# Patient Record
Sex: Male | Born: 1955 | Race: White | Hispanic: No | Marital: Married | Smoking: Former smoker
Health system: Southern US, Community
[De-identification: ages and names within clinical notes are randomized; demographics above are authoritative.]

## PROBLEM LIST (undated history)

## (undated) ENCOUNTER — Emergency Department (HOSPITAL_COMMUNITY): Admission: EM | Payer: Self-pay | Source: Home / Self Care

## (undated) DIAGNOSIS — Z87448 Personal history of other diseases of urinary system: Secondary | ICD-10-CM

## (undated) DIAGNOSIS — I10 Essential (primary) hypertension: Secondary | ICD-10-CM

## (undated) DIAGNOSIS — M21371 Foot drop, right foot: Secondary | ICD-10-CM

## (undated) DIAGNOSIS — E785 Hyperlipidemia, unspecified: Secondary | ICD-10-CM

## (undated) DIAGNOSIS — J45909 Unspecified asthma, uncomplicated: Secondary | ICD-10-CM

## (undated) DIAGNOSIS — K829 Disease of gallbladder, unspecified: Secondary | ICD-10-CM

## (undated) DIAGNOSIS — E663 Overweight: Secondary | ICD-10-CM

## (undated) DIAGNOSIS — M2550 Pain in unspecified joint: Secondary | ICD-10-CM

## (undated) DIAGNOSIS — H409 Unspecified glaucoma: Secondary | ICD-10-CM

## (undated) DIAGNOSIS — R0602 Shortness of breath: Secondary | ICD-10-CM

## (undated) DIAGNOSIS — K5900 Constipation, unspecified: Secondary | ICD-10-CM

## (undated) DIAGNOSIS — I509 Heart failure, unspecified: Secondary | ICD-10-CM

## (undated) DIAGNOSIS — Z91018 Allergy to other foods: Secondary | ICD-10-CM

## (undated) DIAGNOSIS — R339 Retention of urine, unspecified: Secondary | ICD-10-CM

## (undated) DIAGNOSIS — Z96651 Presence of right artificial knee joint: Secondary | ICD-10-CM

## (undated) DIAGNOSIS — F32A Depression, unspecified: Secondary | ICD-10-CM

## (undated) DIAGNOSIS — A159 Respiratory tuberculosis unspecified: Secondary | ICD-10-CM

## (undated) DIAGNOSIS — I2510 Atherosclerotic heart disease of native coronary artery without angina pectoris: Secondary | ICD-10-CM

## (undated) DIAGNOSIS — N200 Calculus of kidney: Secondary | ICD-10-CM

## (undated) DIAGNOSIS — M7989 Other specified soft tissue disorders: Secondary | ICD-10-CM

## (undated) DIAGNOSIS — T4145XA Adverse effect of unspecified anesthetic, initial encounter: Secondary | ICD-10-CM

## (undated) DIAGNOSIS — M549 Dorsalgia, unspecified: Secondary | ICD-10-CM

## (undated) DIAGNOSIS — I214 Non-ST elevation (NSTEMI) myocardial infarction: Secondary | ICD-10-CM

## (undated) DIAGNOSIS — M21372 Foot drop, left foot: Secondary | ICD-10-CM

## (undated) DIAGNOSIS — G4733 Obstructive sleep apnea (adult) (pediatric): Secondary | ICD-10-CM

## (undated) DIAGNOSIS — K219 Gastro-esophageal reflux disease without esophagitis: Secondary | ICD-10-CM

## (undated) DIAGNOSIS — M1990 Unspecified osteoarthritis, unspecified site: Secondary | ICD-10-CM

## (undated) DIAGNOSIS — F419 Anxiety disorder, unspecified: Secondary | ICD-10-CM

## (undated) DIAGNOSIS — I252 Old myocardial infarction: Secondary | ICD-10-CM

## (undated) DIAGNOSIS — G4730 Sleep apnea, unspecified: Secondary | ICD-10-CM

## (undated) DIAGNOSIS — R6889 Other general symptoms and signs: Secondary | ICD-10-CM

## (undated) HISTORY — PX: LAPAROSCOPIC CHOLECYSTECTOMY: SUR755

## (undated) HISTORY — DX: Overweight: E66.3

## (undated) HISTORY — DX: Retention of urine, unspecified: R33.9

## (undated) HISTORY — PX: HERNIA REPAIR: SHX51

## (undated) HISTORY — DX: Pain in unspecified joint: M25.50

## (undated) HISTORY — DX: Unspecified asthma, uncomplicated: J45.909

## (undated) HISTORY — PX: KNEE ARTHROSCOPY: SHX127

## (undated) HISTORY — DX: Personal history of other diseases of urinary system: Z87.448

## (undated) HISTORY — DX: Depression, unspecified: F32.A

## (undated) HISTORY — DX: Old myocardial infarction: I25.2

## (undated) HISTORY — DX: Foot drop, right foot: M21.371

## (undated) HISTORY — DX: Dorsalgia, unspecified: M54.9

## (undated) HISTORY — DX: Shortness of breath: R06.02

## (undated) HISTORY — DX: Disease of gallbladder, unspecified: K82.9

## (undated) HISTORY — DX: Other general symptoms and signs: R68.89

## (undated) HISTORY — DX: Obstructive sleep apnea (adult) (pediatric): G47.33

## (undated) HISTORY — PX: ABDOMINAL HERNIA REPAIR: SHX539

## (undated) HISTORY — DX: Allergy to other foods: Z91.018

## (undated) HISTORY — PX: CYSTOSCOPY W/ STONE MANIPULATION: SHX1427

## (undated) HISTORY — DX: Other specified soft tissue disorders: M79.89

## (undated) HISTORY — DX: Constipation, unspecified: K59.00

## (undated) HISTORY — DX: Presence of right artificial knee joint: Z96.651

## (undated) HISTORY — DX: Unspecified glaucoma: H40.9

## (undated) HISTORY — DX: Sleep apnea, unspecified: G47.30

## (undated) HISTORY — DX: Foot drop, right foot: M21.372

---
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
## 2007-10-13 ENCOUNTER — Emergency Department (HOSPITAL_COMMUNITY): Admission: EM | Admit: 2007-10-13 | Discharge: 2007-10-13 | Payer: Self-pay | Admitting: Emergency Medicine

## 2007-10-14 ENCOUNTER — Emergency Department (HOSPITAL_COMMUNITY): Admission: EM | Admit: 2007-10-14 | Discharge: 2007-10-14 | Payer: Self-pay | Admitting: Emergency Medicine

## 2015-10-10 DIAGNOSIS — I509 Heart failure, unspecified: Secondary | ICD-10-CM

## 2015-10-10 DIAGNOSIS — I214 Non-ST elevation (NSTEMI) myocardial infarction: Secondary | ICD-10-CM

## 2015-10-10 HISTORY — DX: Non-ST elevation (NSTEMI) myocardial infarction: I21.4

## 2015-10-10 HISTORY — DX: Heart failure, unspecified: I50.9

## 2015-10-30 ENCOUNTER — Encounter (HOSPITAL_COMMUNITY)
Admission: EM | Disposition: A | Discharge status: Home / Self Care | Payer: Self-pay | Source: Home / Self Care | Attending: Internal Medicine

## 2015-10-30 ENCOUNTER — Inpatient Hospital Stay (HOSPITAL_COMMUNITY)
Admission: EM | Admit: 2015-10-30 | Discharge: 2015-11-03 | DRG: 246 | Disposition: A | Discharge status: Home / Self Care | Payer: BLUE CROSS/BLUE SHIELD | Attending: Internal Medicine | Admitting: Internal Medicine

## 2015-10-30 ENCOUNTER — Encounter (HOSPITAL_COMMUNITY): Payer: Self-pay | Admitting: Emergency Medicine

## 2015-10-30 ENCOUNTER — Ambulatory Visit (HOSPITAL_COMMUNITY): Payer: BLUE CROSS/BLUE SHIELD

## 2015-10-30 DIAGNOSIS — K5900 Constipation, unspecified: Secondary | ICD-10-CM | POA: Diagnosis not present

## 2015-10-30 DIAGNOSIS — I10 Essential (primary) hypertension: Secondary | ICD-10-CM | POA: Diagnosis present

## 2015-10-30 DIAGNOSIS — I5041 Acute combined systolic (congestive) and diastolic (congestive) heart failure: Secondary | ICD-10-CM | POA: Diagnosis present

## 2015-10-30 DIAGNOSIS — I5042 Chronic combined systolic (congestive) and diastolic (congestive) heart failure: Secondary | ICD-10-CM | POA: Diagnosis present

## 2015-10-30 DIAGNOSIS — Z87891 Personal history of nicotine dependence: Secondary | ICD-10-CM | POA: Diagnosis not present

## 2015-10-30 DIAGNOSIS — I209 Angina pectoris, unspecified: Secondary | ICD-10-CM | POA: Diagnosis not present

## 2015-10-30 DIAGNOSIS — I249 Acute ischemic heart disease, unspecified: Secondary | ICD-10-CM

## 2015-10-30 DIAGNOSIS — I110 Hypertensive heart disease with heart failure: Secondary | ICD-10-CM | POA: Diagnosis present

## 2015-10-30 DIAGNOSIS — I252 Old myocardial infarction: Secondary | ICD-10-CM

## 2015-10-30 DIAGNOSIS — I2510 Atherosclerotic heart disease of native coronary artery without angina pectoris: Secondary | ICD-10-CM | POA: Diagnosis present

## 2015-10-30 DIAGNOSIS — E785 Hyperlipidemia, unspecified: Secondary | ICD-10-CM | POA: Diagnosis present

## 2015-10-30 DIAGNOSIS — I214 Non-ST elevation (NSTEMI) myocardial infarction: Secondary | ICD-10-CM | POA: Diagnosis present

## 2015-10-30 DIAGNOSIS — E782 Mixed hyperlipidemia: Secondary | ICD-10-CM | POA: Diagnosis present

## 2015-10-30 DIAGNOSIS — I2109 ST elevation (STEMI) myocardial infarction involving other coronary artery of anterior wall: Secondary | ICD-10-CM | POA: Diagnosis present

## 2015-10-30 DIAGNOSIS — R079 Chest pain, unspecified: Secondary | ICD-10-CM | POA: Diagnosis present

## 2015-10-30 DIAGNOSIS — I5021 Acute systolic (congestive) heart failure: Secondary | ICD-10-CM | POA: Diagnosis not present

## 2015-10-30 HISTORY — PX: CARDIAC CATHETERIZATION: SHX172

## 2015-10-30 HISTORY — DX: Hyperlipidemia, unspecified: E78.5

## 2015-10-30 HISTORY — DX: Anxiety disorder, unspecified: F41.9

## 2015-10-30 HISTORY — DX: Atherosclerotic heart disease of native coronary artery without angina pectoris: I25.10

## 2015-10-30 HISTORY — DX: Essential (primary) hypertension: I10

## 2015-10-30 LAB — CBC WITH DIFFERENTIAL/PLATELET
BASOS PCT: 0 %
Basophils Absolute: 0 10*3/uL (ref 0.0–0.1)
EOS ABS: 0.4 10*3/uL (ref 0.0–0.7)
Eosinophils Relative: 3 %
HEMATOCRIT: 44.2 % (ref 39.0–52.0)
HEMOGLOBIN: 14.6 g/dL (ref 13.0–17.0)
LYMPHS ABS: 2.4 10*3/uL (ref 0.7–4.0)
Lymphocytes Relative: 22 %
MCH: 28.3 pg (ref 26.0–34.0)
MCHC: 33 g/dL (ref 30.0–36.0)
MCV: 85.7 fL (ref 78.0–100.0)
MONOS PCT: 6 %
Monocytes Absolute: 0.6 10*3/uL (ref 0.1–1.0)
NEUTROS PCT: 69 %
Neutro Abs: 7.5 10*3/uL (ref 1.7–7.7)
Platelets: 272 10*3/uL (ref 150–400)
RBC: 5.16 MIL/uL (ref 4.22–5.81)
RDW: 13 % (ref 11.5–15.5)
WBC: 11 10*3/uL — ABNORMAL HIGH (ref 4.0–10.5)

## 2015-10-30 LAB — COMPREHENSIVE METABOLIC PANEL
ALK PHOS: 66 U/L (ref 38–126)
ALT: 27 U/L (ref 17–63)
ANION GAP: 10 (ref 5–15)
AST: 28 U/L (ref 15–41)
Albumin: 3.9 g/dL (ref 3.5–5.0)
BILIRUBIN TOTAL: 0.7 mg/dL (ref 0.3–1.2)
BUN: 14 mg/dL (ref 6–20)
CALCIUM: 9 mg/dL (ref 8.9–10.3)
CO2: 25 mmol/L (ref 22–32)
Chloride: 107 mmol/L (ref 101–111)
Creatinine, Ser: 1.07 mg/dL (ref 0.61–1.24)
GFR calc non Af Amer: >60 mL/min (ref >60)
Glucose, Bld: 113 mg/dL — ABNORMAL HIGH (ref 65–99)
POTASSIUM: 3.9 mmol/L (ref 3.5–5.1)
SODIUM: 142 mmol/L (ref 135–145)
Total Protein: 7.2 g/dL (ref 6.5–8.1)

## 2015-10-30 LAB — CBC
HCT: 46 % (ref 39.0–52.0)
Hemoglobin: 15 g/dL (ref 13.0–17.0)
MCH: 28.1 pg (ref 26.0–34.0)
MCHC: 32.6 g/dL (ref 30.0–36.0)
MCV: 86.3 fL (ref 78.0–100.0)
PLATELETS: 264 10*3/uL (ref 150–400)
RBC: 5.33 MIL/uL (ref 4.22–5.81)
RDW: 13.1 % (ref 11.5–15.5)
WBC: 12.6 10*3/uL — AB (ref 4.0–10.5)

## 2015-10-30 LAB — CREATININE, SERUM
CREATININE: 1.08 mg/dL (ref 0.61–1.24)
GFR calc Af Amer: >60 mL/min (ref >60)

## 2015-10-30 LAB — I-STAT TROPONIN, ED: TROPONIN I, POC: 0.11 ng/mL — AB (ref 0.00–0.08)

## 2015-10-30 LAB — TROPONIN I: TROPONIN I: 0.8 ng/mL — AB (ref <0.031)

## 2015-10-30 LAB — PROTIME-INR
INR: 1.08 (ref 0.00–1.49)
PROTHROMBIN TIME: 14.2 s (ref 11.6–15.2)

## 2015-10-30 SURGERY — LEFT HEART CATH AND CORONARY ANGIOGRAPHY
Anesthesia: LOCAL

## 2015-10-30 MED ORDER — FUROSEMIDE 10 MG/ML IJ SOLN
INTRAMUSCULAR | Status: AC
  Filled 2015-10-30: qty 4

## 2015-10-30 MED ORDER — FUROSEMIDE 10 MG/ML IJ SOLN
INTRAMUSCULAR | Status: DC | PRN
  Administered 2015-10-30: 20 mg via INTRAVENOUS

## 2015-10-30 MED ORDER — NITROGLYCERIN IN D5W 200-5 MCG/ML-% IV SOLN: 10.0000 ug/min | INTRAVENOUS | Status: DC

## 2015-10-30 MED ORDER — IOPAMIDOL (ISOVUE-370) INJECTION 76%
INTRAVENOUS | Status: AC
  Filled 2015-10-30: qty 100

## 2015-10-30 MED ORDER — SODIUM CHLORIDE 0.9 % IV SOLN: 250.0000 mL | INTRAVENOUS | Status: DC | PRN

## 2015-10-30 MED ORDER — NITROGLYCERIN IN D5W 200-5 MCG/ML-% IV SOLN
INTRAVENOUS | Status: DC | PRN
  Administered 2015-10-30: 10 ug/min via INTRAVENOUS

## 2015-10-30 MED ORDER — NITROGLYCERIN 1 MG/10 ML FOR IR/CATH LAB
INTRA_ARTERIAL | Status: DC | PRN
  Administered 2015-10-30: 200 ug via INTRACORONARY

## 2015-10-30 MED ORDER — SODIUM CHLORIDE 0.9 % IV SOLN
INTRAVENOUS | Status: DC | PRN
  Administered 2015-10-30: 111 mL/h via INTRAVENOUS

## 2015-10-30 MED ORDER — LIDOCAINE HCL (PF) 1 % IJ SOLN
INTRAMUSCULAR | Status: AC
  Filled 2015-10-30: qty 30

## 2015-10-30 MED ORDER — BIVALIRUDIN 250 MG IV SOLR
INTRAVENOUS | Status: AC
  Filled 2015-10-30: qty 250

## 2015-10-30 MED ORDER — TICAGRELOR 90 MG PO TABS
90.0000 mg | ORAL_TABLET | Freq: Two times a day (BID) | ORAL | Status: DC
  Administered 2015-10-31 – 2015-11-03 (×7): 90 mg via ORAL
  Filled 2015-10-30 (×7): qty 1

## 2015-10-30 MED ORDER — NITROGLYCERIN 2 % TD OINT
1.0000 [in_us] | TOPICAL_OINTMENT | Freq: Once | TRANSDERMAL | Status: AC
  Administered 2015-10-30: 1 [in_us] via TOPICAL
  Filled 2015-10-30: qty 1

## 2015-10-30 MED ORDER — TICAGRELOR 90 MG PO TABS
ORAL_TABLET | ORAL | Status: DC | PRN
  Administered 2015-10-30: 180 mg via ORAL

## 2015-10-30 MED ORDER — ONDANSETRON HCL 4 MG/2ML IJ SOLN
INTRAMUSCULAR | Status: AC
  Filled 2015-10-30: qty 2

## 2015-10-30 MED ORDER — TICAGRELOR 90 MG PO TABS
ORAL_TABLET | ORAL | Status: AC
  Filled 2015-10-30: qty 2

## 2015-10-30 MED ORDER — GI COCKTAIL ~~LOC~~: 30.0000 mL | Freq: Once | ORAL | Status: DC

## 2015-10-30 MED ORDER — NITROGLYCERIN 1 MG/10 ML FOR IR/CATH LAB
INTRA_ARTERIAL | Status: AC
  Filled 2015-10-30: qty 10

## 2015-10-30 MED ORDER — ONDANSETRON HCL 4 MG/2ML IJ SOLN: 4.0000 mg | Freq: Four times a day (QID) | INTRAMUSCULAR | Status: DC | PRN

## 2015-10-30 MED ORDER — ENOXAPARIN SODIUM 40 MG/0.4ML ~~LOC~~ SOLN
40.0000 mg | SUBCUTANEOUS | Status: DC
  Administered 2015-10-31 – 2015-11-01 (×2): 40 mg via SUBCUTANEOUS
  Filled 2015-10-30 (×2): qty 0.4

## 2015-10-30 MED ORDER — FENTANYL CITRATE (PF) 100 MCG/2ML IJ SOLN
INTRAMUSCULAR | Status: AC
  Filled 2015-10-30: qty 2

## 2015-10-30 MED ORDER — IOPAMIDOL (ISOVUE-370) INJECTION 76%
INTRAVENOUS | Status: DC | PRN
  Administered 2015-10-30: 170 mL via INTRAVENOUS

## 2015-10-30 MED ORDER — ASPIRIN 81 MG PO CHEW
81.0000 mg | CHEWABLE_TABLET | Freq: Every day | ORAL | Status: DC
  Administered 2015-10-31 – 2015-11-03 (×4): 81 mg via ORAL
  Filled 2015-10-30 (×4): qty 1

## 2015-10-30 MED ORDER — CLONAZEPAM 0.5 MG PO TABS: 0.5000 mg | ORAL_TABLET | Freq: Two times a day (BID) | ORAL | Status: DC | PRN

## 2015-10-30 MED ORDER — SODIUM CHLORIDE 0.9% FLUSH
3.0000 mL | Freq: Two times a day (BID) | INTRAVENOUS | Status: DC
  Administered 2015-10-31 – 2015-11-03 (×8): 3 mL via INTRAVENOUS

## 2015-10-30 MED ORDER — ONDANSETRON HCL 4 MG/2ML IJ SOLN
INTRAMUSCULAR | Status: DC | PRN
  Administered 2015-10-30: 4 mg via INTRAVENOUS

## 2015-10-30 MED ORDER — FENTANYL CITRATE (PF) 100 MCG/2ML IJ SOLN
INTRAMUSCULAR | Status: DC | PRN
  Administered 2015-10-30: 50 ug via INTRAVENOUS

## 2015-10-30 MED ORDER — ACETAMINOPHEN 325 MG PO TABS
650.0000 mg | ORAL_TABLET | ORAL | Status: DC | PRN
  Administered 2015-11-01 – 2015-11-02 (×5): 650 mg via ORAL
  Filled 2015-10-30 (×5): qty 2

## 2015-10-30 MED ORDER — SODIUM CHLORIDE 0.9 % IV SOLN
250.0000 mg | INTRAVENOUS | Status: DC | PRN
  Administered 2015-10-30: 1.75 mg/kg/h via INTRAVENOUS

## 2015-10-30 MED ORDER — PANTOPRAZOLE SODIUM 40 MG PO TBEC
40.0000 mg | DELAYED_RELEASE_TABLET | Freq: Every day | ORAL | Status: DC
  Administered 2015-10-31 – 2015-11-03 (×4): 40 mg via ORAL
  Filled 2015-10-30 (×4): qty 1

## 2015-10-30 MED ORDER — VERAPAMIL HCL 2.5 MG/ML IV SOLN
INTRAVENOUS | Status: DC | PRN
  Administered 2015-10-30: 10 mL via INTRA_ARTERIAL

## 2015-10-30 MED ORDER — SODIUM CHLORIDE 0.9% FLUSH: 3.0000 mL | INTRAVENOUS | Status: DC | PRN

## 2015-10-30 MED ORDER — SODIUM CHLORIDE 0.9 % WEIGHT BASED INFUSION: 1.0000 mL/kg/h | INTRAVENOUS | Status: DC

## 2015-10-30 MED ORDER — HEPARIN (PORCINE) IN NACL 2-0.9 UNIT/ML-% IJ SOLN
INTRAMUSCULAR | Status: DC | PRN
  Administered 2015-10-30: 1000 mL

## 2015-10-30 MED ORDER — PROMETHAZINE HCL 25 MG/ML IJ SOLN
12.5000 mg | Freq: Four times a day (QID) | INTRAMUSCULAR | Status: DC | PRN
  Administered 2015-10-30: 12.5 mg via INTRAVENOUS
  Filled 2015-10-30: qty 1

## 2015-10-30 MED ORDER — VERAPAMIL HCL 2.5 MG/ML IV SOLN
INTRAVENOUS | Status: AC
  Filled 2015-10-30: qty 2

## 2015-10-30 MED ORDER — MIDAZOLAM HCL 2 MG/2ML IJ SOLN
INTRAMUSCULAR | Status: AC
  Filled 2015-10-30: qty 2

## 2015-10-30 MED ORDER — OXYCODONE-ACETAMINOPHEN 5-325 MG PO TABS: 1.0000 | ORAL_TABLET | ORAL | Status: DC | PRN

## 2015-10-30 MED ORDER — BIVALIRUDIN BOLUS VIA INFUSION - CUPID
INTRAVENOUS | Status: DC | PRN
  Administered 2015-10-30: 83.325 mg via INTRAVENOUS

## 2015-10-30 MED ORDER — HEPARIN (PORCINE) IN NACL 2-0.9 UNIT/ML-% IJ SOLN
INTRAMUSCULAR | Status: AC
  Filled 2015-10-30: qty 1000

## 2015-10-30 MED ORDER — MIDAZOLAM HCL 2 MG/2ML IJ SOLN
INTRAMUSCULAR | Status: DC | PRN
  Administered 2015-10-30: 1 mg via INTRAVENOUS

## 2015-10-30 MED ORDER — ATORVASTATIN CALCIUM 80 MG PO TABS
80.0000 mg | ORAL_TABLET | Freq: Every day | ORAL | Status: DC
  Administered 2015-10-31 – 2015-11-01 (×2): 80 mg via ORAL
  Filled 2015-10-30 (×2): qty 1

## 2015-10-30 MED ORDER — ALLOPURINOL 100 MG PO TABS
100.0000 mg | ORAL_TABLET | Freq: Every day | ORAL | Status: DC
  Administered 2015-10-31 – 2015-11-03 (×4): 100 mg via ORAL
  Filled 2015-10-30 (×4): qty 1

## 2015-10-30 SURGICAL SUPPLY — 16 items
BALLN EUPHORA RX 2.5X15 (BALLOONS) ×2
BALLN ~~LOC~~ EUPHORA RX 3.0X12 (BALLOONS) ×2
BALLOON EUPHORA RX 2.5X15 (BALLOONS) ×1 IMPLANT
BALLOON ~~LOC~~ EUPHORA RX 3.0X12 (BALLOONS) ×1 IMPLANT
CATH INFINITI JR4 5F (CATHETERS) ×2 IMPLANT
CATH VISTA GUIDE 6FR XB3.5 (CATHETERS) ×2 IMPLANT
DEVICE RAD COMP TR BAND LRG (VASCULAR PRODUCTS) ×2 IMPLANT
GLIDESHEATH SLEND A-KIT 6F 22G (SHEATH) ×2 IMPLANT
KIT ENCORE 26 ADVANTAGE (KITS) ×2 IMPLANT
KIT HEART LEFT (KITS) ×2 IMPLANT
PACK CARDIAC CATHETERIZATION (CUSTOM PROCEDURE TRAY) ×2 IMPLANT
STENT RESOLUTE INTEG 2.75X18 (Permanent Stent) ×2 IMPLANT
TRANSDUCER W/STOPCOCK (MISCELLANEOUS) ×2 IMPLANT
TUBING CIL FLEX 10 FLL-RA (TUBING) ×2 IMPLANT
WIRE RUNTHROUGH .014X180CM (WIRE) ×2 IMPLANT
WIRE SAFE-T 1.5MM-J .035X260CM (WIRE) ×4 IMPLANT

## 2015-10-30 NOTE — ED Notes (Signed)
Pt placed on monitor with cath pads and transported to cath lab

## 2015-10-30 NOTE — ED Provider Notes (Signed)
CSN: [id number]     Arrival date & time 10/30/15  1917 History   First MD Initiated Contact with Patient 10/30/15 1940     Chief Complaint  Patient presents with  . Chest Pain     (Consider location/radiation/quality/duration/timing/severity/associated sxs/prior Treatment) HPI Comments: Patient is a 60 year old male with no prior cardiac history. He presents with complaints of chest discomfort that started approximately 2 hours prior to presentation. He reports developing pressure in his chest with no shortness of breath, nausea, or diaphoresis. [facility].  Patient is a 60 y.o. male presenting with chest pain. The history is provided by the patient.  Chest Pain Pain location:  Substernal area Pain quality: pressure   Pain radiates to:  Does not radiate Pain radiates to the back: no   Pain severity:  Moderate Onset quality:  Sudden Duration:  2 hours Timing:  Constant Progression:  Worsening Chronicity:  New Relieved by:  Nothing Worsened by:  Nothing tried Ineffective treatments:  None tried   Past Medical History  Diagnosis Date  . Hypertension   . Anxiety    Past Surgical History  Procedure Laterality Date  . Cholecystectomy    . Hernia repair     No family history on file. Social History  Substance Use Topics  . Smoking status: Former Games developermoker  . Smokeless tobacco: None  . Alcohol Use: No    Review of Systems  Cardiovascular: Positive for chest pain.  All other systems reviewed and are negative.     Allergies  Other and Tyloxapol  Home Medications   Prior to Admission medications   Medication Sig Start Date End Date Taking? Authorizing Provider  allopurinol (ZYLOPRIM) 100 MG tablet Take 100 mg by mouth daily.   Yes Historical Provider, MD  aspirin 81 MG tablet Take 81 mg by mouth daily as needed for pain. For chest pain. Given by EMS   Yes Historical Provider, MD  clonazePAM (KLONOPIN) 0.5 MG tablet Take 0.5 mg by  mouth 2 (two) times daily as needed for anxiety.   Yes Historical Provider, MD  nitroGLYCERIN (NITROSTAT) 0.4 MG SL tablet Place 0.4 mg under the tongue every 5 (five) minutes as needed for chest pain. Given by EMS   Yes Historical Provider, MD  omeprazole (PRILOSEC) 20 MG capsule Take 20 mg by mouth 2 (two) times daily before a meal.   Yes Historical Provider, MD   BP 156/108 mmHg  Pulse 0  Resp 53  Ht 5\' 11"  (1.803 m)  Wt 245 lb (111.131 kg)  BMI 34.19 kg/m2  SpO2 0% Physical Exam  Constitutional: He is oriented to person, place, and time. He appears well-developed and well-nourished. No distress.  HENT:  Head: Normocephalic and atraumatic.  Mouth/Throat: Oropharynx is clear and moist.  Neck: Normal range of motion. Neck supple.  Cardiovascular: Normal rate and regular rhythm.  Exam reveals no friction rub.   No murmur heard. Pulmonary/Chest: Effort normal and breath sounds normal. No respiratory distress. He has no wheezes. He has no rales.  Abdominal: Soft. Bowel sounds are normal. He exhibits no distension. There is no tenderness.  Musculoskeletal: Normal range of motion. He exhibits no edema.  Neurological: He is alert and oriented to person, place, and time. Coordination normal.  Skin: Skin is warm and dry. He is not diaphoretic.  Nursing note and vitals reviewed.   ED Course  Procedures (including critical care time) Labs Review Labs Reviewed  CBC WITH DIFFERENTIAL/PLATELET - Abnormal;  Notable for the following:    WBC 11.0 (*)    All other components within normal limits  COMPREHENSIVE METABOLIC PANEL - Abnormal; Notable for the following:    Glucose, Bld 113 (*)    All other components within normal limits  I-STAT TROPOININ, ED - Abnormal; Notable for the following:    Troponin i, poc 0.11 (*)    All other components within normal limits  PROTIME-INR  BASIC METABOLIC PANEL  CBC  CBC  CREATININE, SERUM  TROPONIN I  TROPONIN I  TROPONIN I    Imaging  Review Dg Chest Port 1 View  10/30/2015  CLINICAL DATA:  Chest pain for 1 day. Acute ST elevated myocardial infarct. EXAM: PORTABLE CHEST 1 VIEW COMPARISON:  None. FINDINGS: The heart size and mediastinal contours are within normal limits. Lordotic positioning noted. Both lungs are clear. Old right fifth rib fracture deformity noted. IMPRESSION: No active disease. Electronically Signed   By: Myles Rosenthal M.D.   On: 10/30/2015 20:09   I have personally reviewed and evaluated these images and lab results as part of my medical decision-making.   EKG Interpretation   Date/Time:  Friday October 30 2015 19:24:45 EDT Ventricular Rate:  78 PR Interval:  139 QRS Duration: 89 QT Interval:  392 QTC Calculation: 446 R Axis:   46 Text Interpretation:  Sinus rhythm Normal ECG Confirmed by Keaira Whitehurst  MD,  Nevaan Bunton ([id number]) on 10/30/2015 7:31:43 PM      MDM   Final diagnoses:  None    A code STEMI was called initially by prehospital paramedics. His EKG here reveals slight J-point elevation in V2, however no other obvious abnormality. His pain did improve after morphine and nitroglycerin. He was evaluated by Dr. branch from cardiology. His troponin returned mildly positive at 0.11 and the patient will be taken to the Cath Lab for intervention due to ongoing discomfort.    Geoffery Lyons, MD 10/30/15 [phone]

## 2015-10-30 NOTE — Interval H&P Note (Signed)
Cath Lab Visit (complete for each Cath Lab visit)  Clinical Evaluation Leading to the Procedure:   ACS: Yes.    Non-ACS:    Anginal Classification: CCS IV  Anti-ischemic medical therapy: No Therapy  Non-Invasive Test Results: No non-invasive testing performed  Prior CABG: No previous CABG      History and Physical Interval Note:  10/30/2015 9:24 PM  Cheryll Cockayneicky D Breighner  has presented today for surgery, with the diagnosis of stemi  The various methods of treatment have been discussed with the patient and family. After consideration of risks, benefits and other options for treatment, the patient has consented to  Procedure(s): Left Heart Cath and Coronary Angiography (N/A) as a surgical intervention .  The patient's history has been reviewed, patient examined, no change in status, stable for surgery.  I have reviewed the patient's chart and labs.  Questions were answered to the patient's satisfaction.     Lesleigh NoeSMITH III,Bobbiejo Ishikawa W

## 2015-10-30 NOTE — ED Notes (Signed)
Pt st's pain is a #4 on pain scale 0/10.  Pt continues to belch.  Family at bedside

## 2015-10-30 NOTE — H&P (Addendum)
Clinical Summary Patrick Wilkins is a 60 y.o.male history of tobacco abuse but no known cardiac history presents with chest pain. Reports starting around 6pm onset of 10/10 pressure in midchest with diaphoresis and SOB. Symptoms improved but not resolved with SL NG x3, also received ASA in the field and  of IV morphine. Pain down from 10/10 to 5/10 but not resolved. Brought in as code STEMI by EMS  Allergies  Allergen Reactions  . Other     [place] nuts: Stop breathing   . Tyloxapol     Itching and vomiting     Medications Scheduled Medications: . gi cocktail  30 mL Oral Once     Infusions:     PRN Medications:     Past Medical History  Diagnosis Date  . Hypertension   . Anxiety     Past Surgical History  Procedure Laterality Date  . Cholecystectomy    . Hernia repair      Family History He reports his grandfather had heart disease, he is unsure of the details.    Social History Patrick Wilkins reports that he has quit smoking. He does not have any smokeless tobacco history on file. Patrick Wilkins reports that he does not drink alcohol.  Review of Systems CONSTITUTIONAL: No weight loss, fever, chills, weakness or fatigue.  HEENT: Eyes: No visual loss, blurred vision, double vision or yellow sclerae. No hearing loss, sneezing, congestion, runny nose or sore throat.  SKIN: No rash or itching.  CARDIOVASCULAR: per HPI  RESPIRATORY: No shortness of breath, cough or sputum.  GASTROINTESTINAL: No anorexia, nausea, vomiting or diarrhea. No abdominal pain or blood.  GENITOURINARY: no polyuria, no dysuria NEUROLOGICAL: No headache, dizziness, syncope, paralysis, ataxia, numbness or tingling in the extremities. No change in bowel or bladder control.  MUSCULOSKELETAL: No muscle, back pain, joint pain or stiffness.  HEMATOLOGIC: No anemia, bleeding or bruising.  LYMPHATICS: No enlarged nodes. No history of splenectomy.  PSYCHIATRIC: No history of depression or anxiety.       Physical Examination Blood pressure 155/91, pulse 77, resp. rate 16, height  (1.803 m), weight 245 lb (111.131 kg), SpO2 96 %. No intake or output data in the 24 hours ending 10/30/15 1947  HEENT: sclera clear, throat clear  Cardiovascular: RRR, no m/r/g, no jvd  Respiratory: CTAB  GI: abdomen soft, NT, ND  MSK: no LE edema  Neuro: no focal, deficits  Psych: appropriate affect   Lab Results  Basic Metabolic Panel: No results for input(s): NA, K, CL, CO2, GLUCOSE, BUN, CREATININE, CALCIUM, MG, PHOS in the last 168 hours.  Liver Function Tests: No results for input(s): AST, ALT, ALKPHOS, BILITOT, PROT, ALBUMIN in the last 168 hours.  CBC:  Recent Labs Lab 10/30/15 1923  WBC 11.0*  NEUTROABS 7.5  HGB 14.6  HCT 44.2  MCV 85.7  PLT 272    Cardiac Enzymes: No results for input(s): CKTOTAL, CKMB, CKMBINDEX, TROPONINI in the last 168 hours.  BNP: Invalid input(s): POCBNP  Bedside echo: LVEF 55%, no gross WMAs though visualization limited in several views due to body habitus and limitations on positioning.   Impression/Recommendations 1. Chest pain - acute onset chest pain improved but not resolved with SL NG. Borderline EKG findings, mainly from original field EKG when pain was most severe with borderline anterior ST elevations V1 and V2 and some inferior ST depressions representing possible reciprocal changes. On f/u ER EKGs initial EKG changes not as significant.  - borderline EKG findings  overall but combined with his clinical context there is enough concern for acute MI. Patient remains with acute chest pain with SOB and diaphoretic, will plan for cath lab and evaluation tonight. - initial troponin 0.11   Patrick Wilkins, M.D.

## 2015-10-30 NOTE — ED Notes (Signed)
[facility] with c/o mid sternal chest pain.  Pt st's he was driving when the pain started.  On EMS arrival pt was diaphoretic, pale and nauseated.  EMS gave pt ASA 324mg , NTG SL x's3 and Morphine 6mg  IV  On arrival to ED pt continues to have chest pressure.

## 2015-10-30 NOTE — ED Notes (Signed)
Dr Branch at bedside. ?

## 2015-10-31 DIAGNOSIS — I214 Non-ST elevation (NSTEMI) myocardial infarction: Secondary | ICD-10-CM

## 2015-10-31 DIAGNOSIS — I2109 ST elevation (STEMI) myocardial infarction involving other coronary artery of anterior wall: Principal | ICD-10-CM

## 2015-10-31 DIAGNOSIS — E785 Hyperlipidemia, unspecified: Secondary | ICD-10-CM

## 2015-10-31 DIAGNOSIS — I10 Essential (primary) hypertension: Secondary | ICD-10-CM

## 2015-10-31 LAB — BASIC METABOLIC PANEL
Anion gap: 10 (ref 5–15)
BUN: 11 mg/dL (ref 6–20)
CALCIUM: 8.8 mg/dL — AB (ref 8.9–10.3)
CO2: 26 mmol/L (ref 22–32)
CREATININE: 1.05 mg/dL (ref 0.61–1.24)
Chloride: 104 mmol/L (ref 101–111)
GFR calc Af Amer: >60 mL/min (ref >60)
GLUCOSE: 150 mg/dL — AB (ref 65–99)
Potassium: 4.2 mmol/L (ref 3.5–5.1)
Sodium: 140 mmol/L (ref 135–145)

## 2015-10-31 LAB — CBC
HEMATOCRIT: 43.6 % (ref 39.0–52.0)
Hemoglobin: 14 g/dL (ref 13.0–17.0)
MCH: 27.9 pg (ref 26.0–34.0)
MCHC: 32.1 g/dL (ref 30.0–36.0)
MCV: 86.9 fL (ref 78.0–100.0)
PLATELETS: 280 10*3/uL (ref 150–400)
RBC: 5.02 MIL/uL (ref 4.22–5.81)
RDW: 13.3 % (ref 11.5–15.5)
WBC: 11.9 10*3/uL — ABNORMAL HIGH (ref 4.0–10.5)

## 2015-10-31 LAB — TROPONIN I
TROPONIN I: 4.6 ng/mL — AB (ref <0.031)
TROPONIN I: 9.4 ng/mL — AB (ref <0.031)
Troponin I: 8.86 ng/mL (ref <0.031)
Troponin I: 7.99 ng/mL (ref <0.031)

## 2015-10-31 LAB — MRSA PCR SCREENING: MRSA by PCR: NEGATIVE

## 2015-10-31 MED ORDER — HEART ATTACK BOUNCING BOOK
Freq: Once | Status: AC
Start: 2015-10-31 — End: 2015-10-31
  Administered 2015-10-31: 08:00:00
  Filled 2015-10-31: qty 1

## 2015-10-31 MED ORDER — LISINOPRIL 5 MG PO TABS
5.0000 mg | ORAL_TABLET | Freq: Every day | ORAL | Status: DC
  Administered 2015-10-31 – 2015-11-01 (×2): 5 mg via ORAL
  Filled 2015-10-31 (×2): qty 1

## 2015-10-31 MED ORDER — ANGIOPLASTY BOOK
Freq: Once | Status: AC
  Administered 2015-10-31: 08:00:00
  Filled 2015-10-31: qty 1

## 2015-10-31 MED ORDER — DEXTROSE 50 % IV SOLN: 1.0000 | Freq: Once | INTRAVENOUS | Status: DC

## 2015-10-31 MED ORDER — SODIUM BICARBONATE 8.4 % IV SOLN: 50.0000 meq | Freq: Once | INTRAVENOUS | Status: DC

## 2015-10-31 MED ORDER — EXERCISE FOR HEART AND HEALTH BOOK
Freq: Once | Status: AC
  Administered 2015-10-31: 08:00:00
  Filled 2015-10-31: qty 1

## 2015-10-31 MED ORDER — CARVEDILOL 6.25 MG PO TABS
6.2500 mg | ORAL_TABLET | Freq: Two times a day (BID) | ORAL | Status: DC
  Administered 2015-10-31 – 2015-11-03 (×6): 6.25 mg via ORAL
  Filled 2015-10-31 (×6): qty 1

## 2015-10-31 NOTE — Progress Notes (Signed)
CARDIAC REHAB PHASE I   PRE:  Rate/Rhythm: 81  BP:  Sitting: 155/70     SaO2: 98  MODE:  Ambulation: 350 ft   POST:  Rate/Rhythm: 80  BP:  Sitting: 155/72     SaO2: 98  1:42pm-2:55pm Patient ambulated at a slow and steady pace. Walked independently. Minor shortness of breath, back pain 2/10. Still has chest soreness. Educated on nutrition, risk factors, exercising at home, chest pain, and restrictions. [facility]. Placed in chair with wife at side.  Barnabas ListerMolly M Demarian Epps, MS 10/31/2015 2:48 PM

## 2015-10-31 NOTE — Consult Note (Signed)
CH spoke to admin to have RN give literature/paperwork for advanced directive/HCPOA.  CH can review on Monday and arrange for notary when paperwork completed by pt. Patrick LevineMichael I Balbina Depace

## 2015-10-31 NOTE — Progress Notes (Signed)
SUBJECTIVE: The patient is doing well today.  Mild chest soreness.   Pain has resolved.  At this time, he denies shortness of breath, or any new concerns.  Marland Kitchen allopurinol  100 mg Oral Daily  . angioplasty book   Does not apply Once  . aspirin  81 mg Oral Daily  . atorvastatin  80 mg Oral q1800  . carvedilol  6.25 mg Oral BID WC  . enoxaparin (LOVENOX) injection  40 mg Subcutaneous Q24H  . excerise for heart and health book   Does not apply Once  . heart attack bouncing book   Does not apply Once  . lisinopril  5 mg Oral Daily  . pantoprazole  40 mg Oral Q0600  . sodium chloride flush  3 mL Intravenous Q12H  . ticagrelor  90 mg Oral BID      OBJECTIVE: Physical Exam: Filed Vitals:   10/31/15 0700 10/31/15 0800 10/31/15 0900 10/31/15 1000  BP: 141/75 136/70 146/72 152/86  Pulse: 78 68 84 85  Temp:  97.9 F (36.6 C)    TempSrc:  Oral    Resp: Height:      Weight:      SpO2: 98% 98% 96% 96%    Intake/Output Summary (Last 24 hours) at 10/31/15 1054 Last data filed at 10/31/15 0900  Gross per 24 hour  Intake 1033.46 ml  Output   2125 ml  Net -1091.54 ml    Telemetry reveals sinus rhythm  GEN- The patient is well appearing, alert and oriented x 3 today.   Head- normocephalic, atraumatic Eyes-  Sclera clear, conjunctiva pink Ears- hearing intact Oropharynx- clear Neck- supple,   Lungs- Clear to ausculation bilaterally, normal work of breathing Heart- Regular rate and rhythm, no murmurs, rubs or gallops, PMI not laterally displaced GI- soft, NT, ND, + BS Extremities- no clubbing, cyanosis, or edema Skin- no rash or lesion Psych- euthymic mood, full affect Neuro- strength and sensation are intact  LABS: Basic Metabolic Panel:  Recent Labs  96/04/54 1923 10/30/15 2232 10/31/15 0418  NA 142  --  140  K 3.9  --  4.2  CL 107  --  104  CO2 25  --  26  GLUCOSE 113*  --  150*  BUN 14  --  11  CREATININE 1.07 1.08 1.05  CALCIUM 9.0  --  8.8*    Liver Function Tests:  Recent Labs  10/30/15 1923  AST 28  ALT 27  ALKPHOS 66  BILITOT 0.7  PROT 7.2  ALBUMIN 3.9   No results for input(s): LIPASE, AMYLASE in the last 72 hours. CBC:  Recent Labs  10/30/15 1923 10/30/15 2232 10/31/15 0418  WBC 11.0* 12.6* 11.9*  NEUTROABS 7.5  --   --   HGB 14.6 15.0 14.0  HCT 44.2 46.0 43.6  MCV 85.7 86.3 86.9  PLT 272 264 280   Cardiac Enzymes:  Recent Labs  10/30/15 2232 10/31/15 0418  TROPONINI 0.80* 4.60*   RADIOLOGY: Dg Chest Port 1 View  10/30/2015  CLINICAL DATA:  Chest pain for 1 day. Acute ST elevated myocardial infarct. EXAM: PORTABLE CHEST 1 VIEW COMPARISON:  None. FINDINGS: The heart size and mediastinal contours are within normal limits. Lordotic positioning noted. Both lungs are clear. Old right fifth rib fracture deformity noted. IMPRESSION: No active disease. Electronically Signed   By: Myles Rosenthal M.D.   On: 10/30/2015 20:09    ASSESSMENT AND PLAN:  Principal Problem:  Acute non-ST-elevation myocardial infarction [facility](HCC) Active Problems:   Hyperlipidemia   Essential hypertension   Acute myocardial infarction of anterior wall (HCC)  1. Acute MI Cath with multivessel CAD.  S/p PCI of total occlusion of mid LAD - continue ASA/ brilinta/ statin - add lisinopril and coreg - obtain echo prior to discharge  2. HTN Above goal Add coreg and lisinopril today  Cardiac rehab  Transfer to stepdown Hopefully home in 1-2 days  Hillis RangeJames Latrelle Fuston, MD 10/31/2015 10:54 AM

## 2015-11-01 LAB — LIPID PANEL
CHOL/HDL RATIO: 4.8 ratio
Cholesterol: 171 mg/dL (ref 0–200)
HDL: 36 mg/dL — ABNORMAL LOW (ref >40)
LDL CALC: 100 mg/dL — AB (ref 0–99)
Triglycerides: 173 mg/dL — ABNORMAL HIGH (ref <150)
VLDL: 35 mg/dL (ref 0–40)

## 2015-11-01 LAB — TROPONIN I: TROPONIN I: 6.96 ng/mL — AB (ref <0.031)

## 2015-11-01 MED ORDER — ENOXAPARIN SODIUM 40 MG/0.4ML ~~LOC~~ SOLN
40.0000 mg | SUBCUTANEOUS | Status: DC
  Administered 2015-11-02 – 2015-11-03 (×2): 40 mg via SUBCUTANEOUS
  Filled 2015-11-01 (×2): qty 0.4

## 2015-11-01 MED ORDER — LISINOPRIL 10 MG PO TABS
10.0000 mg | ORAL_TABLET | Freq: Every day | ORAL | Status: DC
  Administered 2015-11-02 – 2015-11-03 (×2): 10 mg via ORAL
  Filled 2015-11-01 (×2): qty 1

## 2015-11-01 NOTE — Progress Notes (Signed)
SUBJECTIVE: The patient is doing well today.  Mild chest soreness.   At this time, he denies shortness of breath, or any new concerns.  Marland Kitchen. allopurinol  100 mg Oral Daily  . aspirin  81 mg Oral Daily  . atorvastatin  80 mg Oral q1800  . carvedilol  6.25 mg Oral BID WC  . [START ON 11/02/2015] enoxaparin (LOVENOX) injection  40 mg Subcutaneous Q24H  . [START ON 11/02/2015] lisinopril  10 mg Oral Daily  . pantoprazole  40 mg Oral Q0600  . sodium chloride flush  3 mL Intravenous Q12H  . ticagrelor  90 mg Oral BID      OBJECTIVE: Physical Exam: Filed Vitals:   11/01/15 0500 11/01/15 0600 11/01/15 0700 11/01/15 0800  BP: 142/77 151/67 123/67 138/71  Pulse: 84 82 79 79  Temp:    98.5 F (36.9 C)  TempSrc:    Oral  Resp: 20 18 13 17   Height:      Weight:      SpO2: 96% 97% 94% 96%    Intake/Output Summary (Last 24 hours) at 11/01/15 1037 Last data filed at 11/01/15 [id number]  Gross per 24 hour  Intake    860 ml  Output    825 ml  Net     35 ml    Telemetry reveals sinus rhythm  GEN- The patient is well appearing, alert and oriented x 3 today.   Head- normocephalic, atraumatic Eyes-  Sclera clear, conjunctiva pink Ears- hearing intact Oropharynx- clear Neck- supple,   Lungs- Clear to ausculation bilaterally, normal work of breathing Heart- Regular rate and rhythm, no murmurs, rubs or gallops, PMI not laterally displaced GI- soft, NT, ND, + BS Extremities- no clubbing, cyanosis, or edema Skin- no rash or lesion Psych- euthymic mood, full affect Neuro- strength and sensation are intact  LABS: Basic Metabolic Panel:  Recent Labs  96/10/5402/21/17 1923 10/30/15 2232 10/31/15 0418  NA 142  --  140  K 3.9  --  4.2  CL 107  --  104  CO2 25  --  26  GLUCOSE 113*  --  150*  BUN 14  --  11  CREATININE 1.07 1.08 1.05  CALCIUM 9.0  --  8.8*   Liver Function Tests:  Recent Labs  10/30/15 1923  AST 28  ALT 27  ALKPHOS 66  BILITOT 0.7  PROT 7.2  ALBUMIN 3.9   No results  for input(s): LIPASE, AMYLASE in the last 72 hours. CBC:  Recent Labs  10/30/15 1923 10/30/15 2232 10/31/15 0418  WBC 11.0* 12.6* 11.9*  NEUTROABS 7.5  --   --   HGB 14.6 15.0 14.0  HCT 44.2 46.0 43.6  MCV 85.7 86.3 86.9  PLT 272 264 280   Cardiac Enzymes:  Recent Labs  10/31/15 1629 10/31/15 2207 11/01/15 0503  TROPONINI 8.86* 7.99* 6.96*   RADIOLOGY: Dg Chest Port 1 View  10/30/2015  CLINICAL DATA:  Chest pain for 1 day. Acute ST elevated myocardial infarct. EXAM: PORTABLE CHEST 1 VIEW COMPARISON:  None. FINDINGS: The heart size and mediastinal contours are within normal limits. Lordotic positioning noted. Both lungs are clear. Old right fifth rib fracture deformity noted. IMPRESSION: No active disease. Electronically Signed   By: Myles RosenthalJohn  Stahl M.D.   On: 10/30/2015 20:09    ASSESSMENT AND PLAN:  Principal Problem:   Acute non-ST-elevation myocardial infarction [facility](HCC) Active Problems:   Hyperlipidemia   Essential hypertension   Acute myocardial infarction of anterior wall (HCC)  1. Acute MI Cath with multivessel CAD.  S/p PCI of total occlusion of mid LAD - continue ASA/ brilinta/ statin/ lisinopril and coreg  - echo ordered  2. HTN Above goal Lisinopril titrated today  3. DDD Avoid celebrex/ NSAIDs if able  Cardiac rehab  Transfer to telemetry Anticipate discharge in am.  Hillis Range, MD 11/01/2015 10:37 AM

## 2015-11-01 NOTE — Progress Notes (Signed)
Patient provided with Advanced Directive packet for review.

## 2015-11-02 ENCOUNTER — Inpatient Hospital Stay (HOSPITAL_COMMUNITY): Payer: BLUE CROSS/BLUE SHIELD

## 2015-11-02 ENCOUNTER — Encounter (HOSPITAL_COMMUNITY): Payer: Self-pay | Admitting: Interventional Cardiology

## 2015-11-02 DIAGNOSIS — K5900 Constipation, unspecified: Secondary | ICD-10-CM | POA: Diagnosis present

## 2015-11-02 DIAGNOSIS — I5042 Chronic combined systolic (congestive) and diastolic (congestive) heart failure: Secondary | ICD-10-CM | POA: Diagnosis present

## 2015-11-02 DIAGNOSIS — I2510 Atherosclerotic heart disease of native coronary artery without angina pectoris: Secondary | ICD-10-CM

## 2015-11-02 DIAGNOSIS — I5021 Acute systolic (congestive) heart failure: Secondary | ICD-10-CM

## 2015-11-02 LAB — ECHOCARDIOGRAM COMPLETE
Height: 71 in
Weight: 3918.4 oz

## 2015-11-02 LAB — BASIC METABOLIC PANEL
ANION GAP: 11 (ref 5–15)
BUN: 14 mg/dL (ref 6–20)
CHLORIDE: 104 mmol/L (ref 101–111)
CO2: 25 mmol/L (ref 22–32)
Calcium: 8.8 mg/dL — ABNORMAL LOW (ref 8.9–10.3)
Creatinine, Ser: 1.01 mg/dL (ref 0.61–1.24)
GFR calc Af Amer: >60 mL/min (ref >60)
Glucose, Bld: 107 mg/dL — ABNORMAL HIGH (ref 65–99)
POTASSIUM: 3.7 mmol/L (ref 3.5–5.1)
SODIUM: 140 mmol/L (ref 135–145)

## 2015-11-02 LAB — POCT ACTIVATED CLOTTING TIME: ACTIVATED CLOTTING TIME: 415 s

## 2015-11-02 MED ORDER — TRAMADOL HCL 50 MG PO TABS
50.0000 mg | ORAL_TABLET | Freq: Four times a day (QID) | ORAL | Status: DC | PRN
  Administered 2015-11-02 (×2): 50 mg via ORAL
  Filled 2015-11-02 (×2): qty 1

## 2015-11-02 MED ORDER — PSYLLIUM 95 % PO PACK
1.0000 | PACK | Freq: Every day | ORAL | Status: DC
  Administered 2015-11-02 – 2015-11-03 (×2): 1 via ORAL
  Filled 2015-11-02 (×2): qty 1

## 2015-11-02 MED ORDER — FUROSEMIDE 10 MG/ML IJ SOLN
40.0000 mg | Freq: Once | INTRAMUSCULAR | Status: AC
  Administered 2015-11-02: 40 mg via INTRAVENOUS
  Filled 2015-11-02: qty 4

## 2015-11-02 MED ORDER — ATORVASTATIN CALCIUM 40 MG PO TABS
40.0000 mg | ORAL_TABLET | Freq: Every day | ORAL | Status: DC
  Administered 2015-11-02: 40 mg via ORAL
  Filled 2015-11-02: qty 1

## 2015-11-02 MED FILL — Nitroglycerin IV Soln 100 MCG/ML in D5W: INTRA_ARTERIAL | Qty: 10 | Status: AC

## 2015-11-02 NOTE — Progress Notes (Signed)
DAILY PROGRESS NOTE  Subjective:  A little short of breath today. No chest pain. Some back pain(4/10), used to take celebrex, but we have advised against it. Tylenol helps some. Mildly constipated. Takes metamucil every day. Only a small bm today. Echo not yet completed.  Objective:  Temp:  [97.8 F (36.6 C)-98.4 F (36.9 C)] 98.1 F (36.7 C) (04/24 0537) Pulse Rate:  [64-72] 72 (04/24 0537) Resp:  [18-20] 20 (04/24 0537) BP: (114-144)/(61-73) 131/64 mmHg (04/24 0537) SpO2:  [96 %-97 %] 96 % (04/24 0537) Weight:  [244 lb 14.4 oz (111.086 kg)] 244 lb 14.4 oz (111.086 kg) (04/24 0537) Weight change:   Intake/Output from previous day: 04/23 0701 - 04/24 0700 In: 620 [P.O.:610; I.V.:10] Out: -   Intake/Output from this shift: Total I/O In: 480 [P.O.:480] Out: -   Medications: Current Facility-Administered Medications  Medication Dose Route Frequency Provider Last Rate Last Dose  . 0.9 %  sodium chloride infusion  250 mL Intravenous PRN Belva Crome, MD      . acetaminophen (TYLENOL) tablet 650 mg  650 mg Oral Q4H PRN Belva Crome, MD   650 mg at 11/02/15 0545  . allopurinol (ZYLOPRIM) tablet 100 mg  100 mg Oral Daily Belva Crome, MD   100 mg at 11/02/15 9528  . aspirin chewable tablet 81 mg  81 mg Oral Daily Belva Crome, MD   81 mg at 11/02/15 0820  . atorvastatin (LIPITOR) tablet 80 mg  80 mg Oral q1800 Belva Crome, MD   80 mg at 11/01/15 1602  . carvedilol (COREG) tablet 6.25 mg  6.25 mg Oral BID WC Thompson Grayer, MD   6.25 mg at 11/02/15 0820  . clonazePAM (KLONOPIN) tablet 0.5 mg  0.5 mg Oral BID PRN Belva Crome, MD      . enoxaparin (LOVENOX) injection 40 mg  40 mg Subcutaneous Q24H Thompson Grayer, MD   40 mg at 11/02/15 0820  . lisinopril (PRINIVIL,ZESTRIL) tablet 10 mg  10 mg Oral Daily Thompson Grayer, MD   10 mg at 11/02/15 4132  . ondansetron (ZOFRAN) injection 4 mg  4 mg Intravenous Q6H PRN Belva Crome, MD      . oxyCODONE-acetaminophen (PERCOCET/ROXICET)  5-325 MG per tablet 1-2 tablet  1-2 tablet Oral Q4H PRN Belva Crome, MD      . pantoprazole (PROTONIX) EC tablet 40 mg  40 mg Oral Q0600 Belva Crome, MD   40 mg at 11/02/15 0545  . promethazine (PHENERGAN) injection 12.5 mg  12.5 mg Intravenous Q6H PRN Clayborne Dana, MD   12.5 mg at 10/30/15 2251  . sodium chloride flush (NS) 0.9 % injection 3 mL  3 mL Intravenous Q12H Belva Crome, MD   3 mL at 11/02/15 0824  . sodium chloride flush (NS) 0.9 % injection 3 mL  3 mL Intravenous PRN Belva Crome, MD      . ticagrelor [facility]) tablet 90 mg  90 mg Oral BID Belva Crome, MD   90 mg at 11/02/15 4401    Physical Exam: General appearance: alert and no distress Neck: no carotid bruit and no JVD Lungs: rales RLL Heart: regular rate and rhythm Abdomen: soft, non-tender; bowel sounds normal; no masses,  no organomegaly Extremities: extremities normal, atraumatic, no cyanosis or edema Pulses: 2+ and symmetric Skin: Skin color, texture, turgor normal. No rashes or lesions Neurologic: Grossly normal  Lab Results: Results for orders placed or performed during  the hospital encounter of 10/30/15 (from the past 48 hour(s))  Troponin I (serum)     Status: Abnormal   Collection Time: 10/31/15 11:22 AM  Result Value Ref Range   Troponin I 9.40 (HH) <0.031 ng/mL    Comment:        POSSIBLE MYOCARDIAL ISCHEMIA. SERIAL TESTING RECOMMENDED. CRITICAL VALUE NOTED.  VALUE IS CONSISTENT WITH PREVIOUSLY REPORTED AND CALLED VALUE.   Troponin I (serum)     Status: Abnormal   Collection Time: 10/31/15  4:29 PM  Result Value Ref Range   Troponin I 8.86 (HH) <0.031 ng/mL    Comment:        POSSIBLE MYOCARDIAL ISCHEMIA. SERIAL TESTING RECOMMENDED. CRITICAL VALUE NOTED.  VALUE IS CONSISTENT WITH PREVIOUSLY REPORTED AND CALLED VALUE.   Troponin I (serum)     Status: Abnormal   Collection Time: 10/31/15 10:07 PM  Result Value Ref Range   Troponin I 7.99 (HH) <0.031 ng/mL    Comment:        POSSIBLE  MYOCARDIAL ISCHEMIA. SERIAL TESTING RECOMMENDED. CRITICAL VALUE NOTED.  VALUE IS CONSISTENT WITH PREVIOUSLY REPORTED AND CALLED VALUE.   Troponin I (serum)     Status: Abnormal   Collection Time: 11/01/15  5:03 AM  Result Value Ref Range   Troponin I 6.96 (HH) <0.031 ng/mL    Comment:        POSSIBLE MYOCARDIAL ISCHEMIA. SERIAL TESTING RECOMMENDED. CRITICAL VALUE NOTED.  VALUE IS CONSISTENT WITH PREVIOUSLY REPORTED AND CALLED VALUE.   Lipid panel     Status: Abnormal   Collection Time: 11/01/15  5:04 AM  Result Value Ref Range   Cholesterol 171 0 - 200 mg/dL   Triglycerides 173 (H) <150 mg/dL   HDL 36 (L) >40 mg/dL   Total CHOL/HDL Ratio 4.8 RATIO   VLDL 35 0 - 40 mg/dL   LDL Cholesterol 100 (H) 0 - 99 mg/dL    Comment:        Total Cholesterol/HDL:CHD Risk Coronary Heart Disease Risk Table                     Men   Women  1/2 Average Risk   3.4   3.3  Average Risk       5.0   4.4  2 X Average Risk   9.6   7.1  3 X Average Risk  23.4   11.0        Use the calculated Patient Ratio above and the CHD Risk Table to determine the patient's CHD Risk.        ATP III CLASSIFICATION (LDL):  <100     mg/dL   Optimal  100-129  mg/dL   Near or Above                    Optimal  130-159  mg/dL   Borderline  160-189  mg/dL   High  >190     mg/dL   Very High   Basic metabolic panel     Status: Abnormal   Collection Time: 11/02/15  4:52 AM  Result Value Ref Range   Sodium 140 135 - 145 mmol/L   Potassium 3.7 3.5 - 5.1 mmol/L   Chloride 104 101 - 111 mmol/L   CO2 25 22 - 32 mmol/L   Glucose, Bld 107 (H) 65 - 99 mg/dL   BUN 14 6 - 20 mg/dL   Creatinine, Ser 1.01 0.61 - 1.24 mg/dL   Calcium 8.8 (L)  8.9 - 10.3 mg/dL   GFR calc non Af Amer >60 >60 mL/min   GFR calc Af Amer >60 >60 mL/min    Comment: (NOTE) The eGFR has been calculated using the CKD EPI equation. This calculation has not been validated in all clinical situations. eGFR's persistently <60 mL/min signify possible  Chronic Kidney Disease.    Anion gap 11 5 - 15    Imaging: Imaging results have been reviewed  Assessment:  1. Principal Problem: 2.   Acute non-ST-elevation myocardial infarction ([place]) 3. Active Problems: 4.   Hyperlipidemia 5.   Essential hypertension 6.   Acute myocardial infarction of anterior wall (HCC) 7.   Plan:  1. Mildly dyspneic today-  EF 40-45%, mild right basilar crackles. Will give IV lasix today. Await echo results. Start daily metamucil per patient request for constipation. Will add tramadol for breakthrough pain, not controlled by tylenol. Radial cath site looks good. Possible d/c tomorrow. Check BMET and BNP in am. I'm happy to see the patient in follow-up at our [facility] office.  Time Spent Directly with Patient:  15 minutes   Length of Stay:  LOS: 3 days   Pixie Casino, MD, [facility] Attending Cardiologist [place] 11/02/2015, 10:39 AM

## 2015-11-02 NOTE — Progress Notes (Signed)
[id number] Came to see if pt had any questions re education done on Saturday. Brief review done stressing importance of brilinta, reviewed NTG use, heart healthy and low sodium diets, ex ed and CRP 2. Pt had walked earlier independently without CP.  We are referring to [place] Phase 2. Luetta NuttingCharlene Haylen Bellotti RN BSN 11/02/2015 10:46 AM

## 2015-11-02 NOTE — Care Management Note (Signed)
Case Management Note  Patient Details  Name: Cheryll CockayneRicky D Scafidi MRN: [id number] Date of Birth: 1955/09/21  Subjective/Objective:  Pt admitted for NStemi- Plan for d/c home on Brilinta. Co pay and 30 day free card provided to patient. CM will call pharmacy to make sure is available[facility] [place]. No further needs from CM at this time.                   Action/Plan: S/W MATE[facility] @ [facility] # [id number]   BRILINTA 90 MG BID ( 30 )   COVER- YES  CO-PAY- $ 40.00  PRIOR APPROVAL - NO  [facility]    Expected Discharge Date:  11/02/15               Expected Discharge Plan:  Home/Self Care  In-House Referral:  NA  Discharge planning Services  CM Consult, Medication Assistance  Post Acute Care Choice:  NA Choice offered to:  NA  DME Arranged:  N/A DME Agency:  NA  HH Arranged:  NA HH Agency:  NA  Status of Service:  Completed, signed off  Medicare Important Message Given:    Date Medicare IM Given:    Medicare IM give by:    Date Additional Medicare IM Given:    Additional Medicare Important Message give by:     If discussed at Long Length of Stay Meetings, dates discussed:    Additional Comments:  Gala LewandowskyGraves-Bigelow, Norina Cowper Kaye, RN 11/02/2015, 12:06 PM

## 2015-11-02 NOTE — Discharge Instructions (Addendum)
Heart-Healthy Eating Plan °Many factors influence your heart health, including eating and exercise habits. Heart (coronary) risk increases with abnormal blood fat (lipid) levels. Heart-healthy meal planning includes limiting unhealthy fats, increasing healthy fats, and making other small dietary changes. This includes maintaining a healthy body weight to help keep lipid levels within a normal range. °WHAT IS MY PLAN?  °Your health care provider recommends that you: °· Get no more than _________% of the total calories in your daily diet from fat. °· Limit your intake of saturated fat to less than _________% of your total calories each day. °· Limit the amount of cholesterol in your diet to less than _________ mg per day. °WHAT TYPES OF FAT SHOULD I CHOOSE? °· Choose healthy fats more often. Choose monounsaturated and polyunsaturated fats, such as olive oil and canola oil, flaxseeds, walnuts, almonds, and seeds. °· Eat more omega-3 fats. Good choices include salmon, mackerel, sardines, tuna, flaxseed oil, and ground flaxseeds. Aim to eat fish at least two times each week. °· Limit saturated fats. Saturated fats are primarily found in animal products, such as meats, butter, and cream. Plant sources of saturated fats include palm oil, palm kernel oil, and coconut oil. °· Avoid foods with partially hydrogenated oils in them. These contain trans fats. Examples of foods that contain trans fats are stick margarine, some tub margarines, cookies, crackers, and other baked goods. °WHAT GENERAL GUIDELINES DO I NEED TO FOLLOW? °· Check food labels carefully to identify foods with trans fats or high amounts of saturated fat. °· Fill one half of your plate with vegetables and green salads. Eat 4-5 servings of vegetables per day. A serving of vegetables equals 1 cup of raw leafy vegetables, ½ cup of raw or cooked cut-up vegetables, or ½ cup of vegetable juice. °· Fill one fourth of your plate with whole grains. Look for the word  "whole" as the first word in the ingredient list. °· Fill one fourth of your plate with lean protein foods. °· Eat 4-5 servings of fruit per day. A serving of fruit equals one medium whole fruit, ¼ cup of dried fruit, ½ cup of fresh, frozen, or canned fruit, or ½ cup of 100% fruit juice. °· Eat more foods that contain soluble fiber. Examples of foods that contain this type of fiber are apples, broccoli, carrots, beans, peas, and barley. Aim to get 20-30 g of fiber per day. °· Eat more home-cooked food and less restaurant, buffet, and fast food. °· Limit or avoid alcohol. °· Limit foods that are high in starch and sugar. °· Avoid fried foods. °· Cook foods by using methods other than frying. Baking, boiling, grilling, and broiling are all great options. Other fat-reducing suggestions include: °¨ Removing the skin from poultry. °¨ Removing all visible fats from meats. °¨ Skimming the fat off of stews, soups, and gravies before serving them. °¨ Steaming vegetables in water or broth. °· Lose weight if you are overweight. Losing just 5-10% of your initial body weight can help your overall health and prevent diseases such as diabetes and heart disease. °· Increase your consumption of nuts, legumes, and seeds to 4-5 servings per week. One serving of dried beans or legumes equals ½ cup after being cooked, one serving of nuts equals 1½ ounces, and one serving of seeds equals ½ ounce or 1 tablespoon. °· You may need to monitor your salt (sodium) intake, especially if you have high blood pressure. Talk with your health care provider or dietitian to get   more information about reducing sodium. °WHAT FOODS CAN I EAT? °Grains °Breads, including French, white, pita, wheat, raisin, rye, oatmeal, and Italian. Tortillas that are neither fried nor made with lard or trans fat. Low-fat rolls, including hotdog and hamburger buns and English muffins. Biscuits. Muffins. Waffles. Pancakes. Light popcorn. Whole-grain cereals. Flatbread. Melba  toast. Pretzels. Breadsticks. Rusks. Low-fat snacks and crackers, including oyster, saltine, matzo, graham, animal, and rye. Rice and pasta, including brown rice and those that are made with whole wheat. °Vegetables °All vegetables. °Fruits °All fruits, but limit coconut. °Meats and Other Protein Sources °Lean, well-trimmed beef, veal, pork, and lamb. Chicken and turkey without skin. All fish and shellfish. Wild duck, rabbit, pheasant, and venison. Egg whites or low-cholesterol egg substitutes. Dried beans, peas, lentils, and tofu. Seeds and most nuts. °Dairy °Low-fat or nonfat cheeses, including ricotta, string, and mozzarella. Skim or 1% milk that is liquid, powdered, or evaporated. Buttermilk that is made with low-fat milk. Nonfat or low-fat yogurt. °Beverages °Mineral water. Diet carbonated beverages. °Sweets and Desserts °Sherbets and fruit ices. Honey, jam, marmalade, jelly, and syrups. Meringues and gelatins. Pure sugar candy, such as hard candy, jelly beans, gumdrops, mints, marshmallows, and small amounts of dark chocolate. Angel food cake. °Eat all sweets and desserts in moderation. °Fats and Oils °Nonhydrogenated (trans-free) margarines. Vegetable oils, including soybean, sesame, sunflower, olive, peanut, safflower, corn, canola, and cottonseed. Salad dressings or mayonnaise that are made with a vegetable oil. Limit added fats and oils that you use for cooking, baking, salads, and as spreads. °Other °Cocoa powder. Coffee and tea. All seasonings and condiments. °The items listed above may not be a complete list of recommended foods or beverages. Contact your dietitian for more options. °WHAT FOODS ARE NOT RECOMMENDED? °Grains °Breads that are made with saturated or trans fats, oils, or whole milk. Croissants. Butter rolls. Cheese breads. Sweet rolls. Donuts. Buttered popcorn. Chow mein noodles. High-fat crackers, such as cheese or butter crackers. °Meats and Other Protein Sources °Fatty meats, such as  hotdogs, short ribs, sausage, spareribs, bacon, ribeye roast or steak, and mutton. High-fat deli meats, such as salami and bologna. Caviar. Domestic duck and goose. Organ meats, such as kidney, liver, sweetbreads, brains, gizzard, chitterlings, and heart. °Dairy °Cream, sour cream, cream cheese, and creamed cottage cheese. Whole milk cheeses, including blue (bleu), Monterey Jack, Brie, Colby, American, Havarti, Swiss, cheddar, Camembert, and [place].  Whole or 2% milk that is liquid, evaporated, or condensed. Whole buttermilk. Cream sauce or high-fat cheese sauce. Yogurt that is made from whole milk. °Beverages °Regular sodas and drinks with added sugar. °Sweets and Desserts °Frosting. Pudding. Cookies. Cakes other than angel food cake. Candy that has milk chocolate or white chocolate, hydrogenated fat, butter, coconut, or unknown ingredients. Buttered syrups. Full-fat ice cream or ice cream drinks. °Fats and Oils °Gravy that has suet, meat fat, or shortening. Cocoa butter, hydrogenated oils, palm oil, coconut oil, palm kernel oil. These can often be found in baked products, candy, fried foods, nondairy creamers, and whipped toppings. Solid fats and shortenings, including bacon fat, salt pork, lard, and butter. Nondairy cream substitutes, such as coffee creamers and sour cream substitutes. Salad dressings that are made of unknown oils, cheese, or sour cream. °The items listed above may not be a complete list of foods and beverages to avoid. Contact your dietitian for more information. °  °This information is not intended to replace advice given to you by your health care provider. Make sure you discuss any questions you have with your health   care provider.   Document Released: 04/05/2008 Document Revised: 07/18/2014 Document Reviewed: 12/19/2013 Elsevier Interactive Patient Education 2016 Else[employer] Inc[employer] Heart Attack A heart attack (myocardial infarction, MI) causes damage to your heart that cannot be fixed. A  heart attack can happen when a heart (coronary) artery becomes blocked or narrowed. This cuts off the blood supply and oxygen to your heart. When one or more of your coronary arteries becomes blocked, that area of your heart begins to die. This causes the pain you feel during a heart attack. Heart attack pain can also occur in one part of the body but be felt in another part of the body (referred pain). You may feel referred heart attack pain in your left arm, neck, or jaw. Pain may even be felt in the right arm. CAUSES  Many conditions can cause a heart attack. These include:   Atherosclerosis. This is when a fatty substance (plaque) gradually builds up in the arteries. This buildup can block or reduce the blood supply to one or more of the heart arteries.  A blood clot. A blood clot can develop suddenly when plaque breaks up (ruptures) within a heart artery. A blood clot can block the heart artery, which prevents blood flow to the heart.   Severe tightening (spasm) of the heart artery. This cuts off blood flow through the artery.  RISK FACTORS People at risk for heart attack usually have one or more of the following risk factors:   High blood pressure (hypertension).  High cholesterol.  Smoking.  Being male.  Being overweight or obese.  Older aged.   A family history of heart disease.  Lack of exercise.  Diabetes.  Stress.  Drinking too much alcohol.  Using illegal street drugs, such as cocaine and methamphetamines. SYMPTOMS  Heart attack symptoms can vary from person to person. Symptoms depend on factors like gender and age.   In both men and women, heart attack symptoms can include the following:   Chest pain. This may feel like crushing, squeezing, or a feeling of pressure.  Shortness of breath.  Heartburn or indigestion with or without vomiting, shortness of breath, or sweating.  Sudden cold sweats.  Sudden light-headedness.  Upper back pain.   Women  can have unique heart attack symptoms, such as:   Unexplained feelings of nervousness or anxiety.  Discomfort between the shoulder blades or upper back.  Tingling in the hands and arms.   Older people (of both genders) can have subtle heart attack symptoms, such as:   Sweating.  Shortness of breath.  General tiredness or not feeling well.  DIAGNOSIS  Diagnosing a heart attack involves several tests. They include:   An assessment of your vital signs. This includes checking your:  Heart rhythm.  Blood pressure.  Breathing rate.  Oxygen level.   An ECG (electrocardiogram) to measure the electrical activity of your heart.  Blood tests called cardiac markers. In these tests, blood is drawn at scheduled times to check for the specific proteins or enzymes released by damaged heart muscle.  A chest X-ray.  An echocardiogram to evaluate heart motion and blood flow.  Coronary angiography to look at the heart arteries.  TREATMENT  Treatment for a heart attack may include:   Medicine that breaks up or dissolves blood clots in the heart artery.  Angioplasty.  Cardiac stent placement.  Intra-aortic balloon pump therapy (IABP).  Open heart surgery (coronary artery bypass graft, CABG). HOME CARE INSTRUCTIONS  Take medicines only as directed by  your health care provider. You may need to take medicine after a heart attack to:   Keep your blood from clotting too easily.  Control your blood pressure.  Lower your cholesterol.  Control abnormal heart rhythms.   Do not take the following medicines unless your health care provider approves:  Nonsteroidal anti-inflammatory drugs (NSAIDs), such as ibuprofen, naproxen, or celecoxib.  Vitamin supplements that contain vitamin A, vitamin E, or both.  Hormone replacement therapy that contains estrogen with or without progestin.  Make lifestyle changes as directed by your health care provider. These may include:    Quitting smoking, if you smoke.  Getting regular exercise. Ask your health care provider to suggest some activities that are safe for you.  Eating a heart-healthy diet. A registered dietitian can help you learn healthy eating options.  Maintaining a healthy weight.   Managing diabetes, if necessary.  Reducing stress.  Limiting how much alcohol you drink. SEEK IMMEDIATE MEDICAL CARE IF:   You have sudden, unexplained chest discomfort.  You have sudden, unexplained discomfort in your arms, back, neck, or jaw.  You have shortness of breath at any time.  You suddenly start to sweat or your skin gets clammy.  You feel nauseous or vomit.  You suddenly feel light-headed or dizzy.  Your heart begins to beat fast or feels like it is skipping beats. These symptoms may represent a serious problem that is an emergency. Do not wait to see if the symptoms will go away. Get medical help right away. Call your local emergency services (911 in the [place] Do not drive yourself to the hospital.   This information is not intended to replace advice given to you by your health care provider. Make sure you discuss any questions you have with your health care provider.   Document Released: 06/27/2005 Document Revised: 07/18/2014 Document Reviewed: 08/30/2013 Elsevier Interactive Patient Education [employer]

## 2015-11-02 NOTE — Progress Notes (Signed)
Pt states he believes Lipitor is causing new aches in his "Hip" joint. PA, Wynema BirchHao agrees to reduce dose from 80mg  to 40mg . New order received.

## 2015-11-02 NOTE — Progress Notes (Signed)
  Echocardiogram 2D Echocardiogram has been performed.  Janalyn HarderWest, Gwin Eagon R 11/02/2015, 2:23 PM

## 2015-11-03 ENCOUNTER — Telehealth: Payer: Self-pay | Admitting: Internal Medicine

## 2015-11-03 ENCOUNTER — Encounter (HOSPITAL_COMMUNITY): Payer: Self-pay | Admitting: Physician Assistant

## 2015-11-03 LAB — BASIC METABOLIC PANEL
Anion gap: 12 (ref 5–15)
BUN: 17 mg/dL (ref 6–20)
CALCIUM: 9.1 mg/dL (ref 8.9–10.3)
CHLORIDE: 100 mmol/L — AB (ref 101–111)
CO2: 26 mmol/L (ref 22–32)
CREATININE: 1.17 mg/dL (ref 0.61–1.24)
GFR calc Af Amer: >60 mL/min (ref >60)
GFR calc non Af Amer: >60 mL/min (ref >60)
GLUCOSE: 104 mg/dL — AB (ref 65–99)
Potassium: 3.6 mmol/L (ref 3.5–5.1)
Sodium: 138 mmol/L (ref 135–145)

## 2015-11-03 LAB — BRAIN NATRIURETIC PEPTIDE: B Natriuretic Peptide: 41.1 pg/mL (ref 0.0–100.0)

## 2015-11-03 MED ORDER — LISINOPRIL 10 MG PO TABS: 10.0000 mg | ORAL_TABLET | Freq: Every day | ORAL | Status: DC

## 2015-11-03 MED ORDER — CARVEDILOL 6.25 MG PO TABS: 6.2500 mg | ORAL_TABLET | Freq: Two times a day (BID) | ORAL | Status: DC

## 2015-11-03 MED ORDER — TICAGRELOR 90 MG PO TABS: 90.0000 mg | ORAL_TABLET | Freq: Two times a day (BID) | ORAL | Status: DC

## 2015-11-03 MED ORDER — NITROGLYCERIN 0.4 MG SL SUBL: 0.4000 mg | SUBLINGUAL_TABLET | SUBLINGUAL | Status: DC | PRN

## 2015-11-03 MED ORDER — ATORVASTATIN CALCIUM 80 MG PO TABS: 80.0000 mg | ORAL_TABLET | Freq: Every day | ORAL | Status: DC

## 2015-11-03 NOTE — Telephone Encounter (Signed)
Pt was discharge today 04/25, first TOC call should be on 04/26 

## 2015-11-03 NOTE — Progress Notes (Signed)
Pt ambulated in hall independently multiple times last pm.  Pt denied CP/SOB.  Will continue to monitor.

## 2015-11-03 NOTE — Discharge Summary (Signed)
Discharge Summary    Patient ID: Patrick Wilkins,  MRN: [id number], DOB/AGE: 1955-12-30 60 y.o.  Admit date: 10/30/2015 Discharge date: 11/03/2015  Primary Care Provider: Teola BradleyMORGAN, Patrick K II Primary Cardiologist: Dr. Rennis GoldenHilty ([place])  Discharge Diagnoses    Principal Problem:   Acute non-ST-elevation myocardial infarction [facility](HCC) Active Problems:   Hyperlipidemia   Essential hypertension   Acute myocardial infarction of anterior wall (HCC)   Acute systolic (congestive) heart failure (HCC)   Constipation   Allergies Allergies  Allergen Reactions  . Other     Estonia[place] nuts: Stop breathing   . Tyloxapol     Itching and vomiting     Diagnostic Studies/Procedures    Procedures    Coronary Stent Intervention   Left Heart Cath and Coronary Angiography    Conclusion    1. Ost 1st Mrg to 1st Mrg lesion, 70% stenosed. 2. Mid Cx to Dist Cx lesion, 60% stenosed. 3. RPDA lesion, 70% stenosed. 4. Prox RCA to Dist RCA lesion, 25% stenosed. 5. Ost 1st Diag lesion, 75% stenosed. 6. Ost LAD to Prox LAD lesion, 60% stenosed. 7. LM lesion, 45% stenosed. 8. Mid LAD to Dist LAD lesion, 100% stenosed. Post intervention, there is a 0% residual stenosis.   Acute coronary syndrome with presentation of non-ST elevation myocardial infarction noted by ongoing chest pain and elevated troponins. There were no diagnostic ischemic EKG changes noted.  Total occlusion of the mid LAD well collateralized from the right coronary explaining the absence of EKG changes.  Successful PTCA and stenting of the mid LAD from 100% to 0% with TIMI grade 3 flow using a resolute 2.75 mm x 18 mm long stent postdilated to 3.0 mm.  There is ostial and proximal LAD 50% stenosis, 50-70% PDA stenosis, and 70% stenosis of the first obtuse marginal.  Apical akinesis with estimated EF 40-45% with elevated LVEDP consistent with acute combined systolic and diastolic heart failure.   RECOMMENDATIONS:  IV heparin 20 mg  IV  Aspirin and Brilinta 12 months  Aggressive risk factor modification including high-intensity statin therapy, screening for diabetes, and aggressive blood pressure control.  Potential fast-track eligible for discharge at 48 hours.  Anticipate significant recovery of apical segment due to the presence of well formed collaterals.   Echo 11/02/15.  LV EF: 50% - 55%  ------------------------------------------------------------------- Indications: CAD of native vessels 414.01.  ------------------------------------------------------------------- History: PMH: Acute anterior MI. NSTEMI. Congestive heart failure. Risk factors: Hypertension. Dyslipidemia.  ------------------------------------------------------------------- Study Conclusions  - Procedure narrative: Transthoracic echocardiography. Image  quality was adequate. The study was technically difficult, as a  result of poor acoustic windows, poor sound wave transmission,  and body habitus. - Left ventricle: The cavity size was normal. Systolic function was  normal. The estimated ejection fraction was in the range of 50%  to 55%. Wall motion was normal; there were no regional wall  motion abnormalities. Features are consistent with a pseudonormal  left ventricular filling pattern, with concomitant abnormal  relaxation and increased filling pressure (grade 2 diastolic  dysfunction).  Impressions:  - Low normal LV systolic function; grade 2 diastolic dysfunction;  trace MR and TR.    History of Present Illness     [name] is a 60 y.o.male history of tobacco abuse but no known cardiac history presented 10/30/15 with chest pain. Reports starting around 6pm onset of 10/10 pressure in midchest with diaphoresis and SOB. Symptoms improved but not resolved with SL NG x3, also received ASA in the field  and  of IV morphine. Pain down from 10/10 to 5/10 but not resolved. Brought in as code STEMI by  EMS. Initial troponin of 0.11.   Hospital Course     Consultants: None  Cath with multivessel CAD. S/p PTCA and DES of total occlusion of mid LAD. Continued ASA/ brilinta/ statin/ lisinopril and coreg. Continue to have mild chest discomfort. Due to elevated BP, lisinopril dose was adjusted. The patient was given IV lasix for mild right basilar crackles and EF of 40-45% on cath. Echo showed improved EF to 50-55%, grade 2 DD, trace MR and TR. Dyspnea has resolved with lasix, felt he will not need lasix at home. Changed protonix back to omeprazole at discharge.  The patient takes Celebrex for DDD, advised to avoid celebrex/ NSAIDs if able.   The patient has been seen by Dr. Rennis Golden today and deemed ready for discharge home. All follow-up appointments have been scheduled. Discharge medications are listed below.    Discharge Vitals Blood pressure 120/62, pulse 74, temperature 97.9 F (36.6 C), temperature source Oral, resp. rate 20, height  (1.803 m), weight 244 lb 8 oz (110.904 kg), SpO2 98 %.  Filed Weights   10/30/15 2230 11/02/15 0537 11/03/15 0300  Weight: 247 lb 12.8 oz (112.4 kg) 244 lb 14.4 oz (111.086 kg) 244 lb 8 oz (110.904 kg)    Labs & Radiologic Studies     CBC No results for input(s): WBC, NEUTROABS, HGB, HCT, MCV, PLT in the last 72 hours. Basic Metabolic Panel  Recent Labs  11/02/15 0452 11/03/15 0609  NA 140 138  K 3.7 3.6  CL 104 100*  CO2 25 26  GLUCOSE 107* 104*  BUN 14 17  CREATININE 1.01 1.17  CALCIUM 8.8* 9.1   Liver Function Tests No results for input(s): AST, ALT, ALKPHOS, BILITOT, PROT, ALBUMIN in the last 72 hours. No results for input(s): LIPASE, AMYLASE in the last 72 hours. Cardiac Enzymes  Recent Labs  10/31/15 1629 10/31/15 2207 11/01/15 0503  TROPONINI 8.86* 7.99* 6.96*   BNP Invalid input(s): POCBNP D-Dimer No results for input(s): DDIMER in the last 72 hours. Hemoglobin A1C No results for input(s): HGBA1C in the last 72  hours. Fasting Lipid Panel  Recent Labs  11/01/15 0504  CHOL 171  HDL 36*  LDLCALC 100*  TRIG 173*  CHOLHDL 4.8   Thyroid Function Tests No results for input(s): TSH, T4TOTAL, T3FREE, THYROIDAB in the last 72 hours.  Invalid input(s): FREET3  Dg Chest Port 1 View  10/30/2015  CLINICAL DATA:  Chest pain for 1 day. Acute ST elevated myocardial infarct. EXAM: PORTABLE CHEST 1 VIEW COMPARISON:  None. FINDINGS: The heart size and mediastinal contours are within normal limits. Lordotic positioning noted. Both lungs are clear. Old right fifth rib fracture deformity noted. IMPRESSION: No active disease. Electronically Signed   By: Myles Rosenthal M.D.   On: 10/30/2015 20:09    Disposition   Pt is being discharged home today in good condition.  Follow-up Plans & Appointments    Follow-up Information    Follow up with [facility] On 11/10/2015.   Specialty:  Cardiology   Why:  :00 am for TCM @ [place], NP   Contact information:   [address] [place] [address] [phone]     Discharge Instructions    Amb Referral to Cardiac Rehabilitation    Complete by:  As directed   Diagnosis:  NSTEMI     Diet -  low sodium heart healthy    Complete by:  As directed      Discharge instructions    Complete by:  As directed   NO HEAVY LIFTING (>10lbs) X 2 WEEKS. NO SEXUAL ACTIVITY X 2 WEEKS. NO DRIVING X 1 WEEK. NO SOAKING BATHS, HOT TUBS, POOLS, ETC., X 7 DAYS. You may return to work on 11/11/15.     Increase activity slowly    Complete by:  As directed            Discharge Medications   Discharge Medication List as of 11/03/2015 10:36 AM    START taking these medications   Details  atorvastatin (LIPITOR) 80 MG tablet Take 1 tablet (80 mg total) by mouth daily at 6 PM., Starting 11/03/2015, Until Discontinued, Normal    carvedilol (COREG) 6.25 MG tablet Take 1 tablet (6.25 mg total) by mouth 2 (two) times daily with a meal.,  Starting 11/03/2015, Until Discontinued, Normal    lisinopril (PRINIVIL,ZESTRIL) 10 MG tablet Take 1 tablet (10 mg total) by mouth daily., Starting 11/03/2015, Until Discontinued, Normal      CONTINUE these medications which have CHANGED   Details  ticagrelor (BRILINTA) 90 MG TABS tablet Take 1 tablet (90 mg total) by mouth 2 (two) times daily., Starting 11/03/2015, Until Discontinued, Print      CONTINUE these medications which have NOT CHANGED   Details  allopurinol (ZYLOPRIM) 100 MG tablet Take 100 mg by mouth daily., Until Discontinued, Historical Med    aspirin 81 MG tablet Take 81 mg by mouth daily as needed for pain. For chest pain. Given by EMS, Until Discontinued, Historical Med    clonazePAM (KLONOPIN) 0.5 MG tablet Take 0.5 mg by mouth 2 (two) times daily as needed for anxiety., Until Discontinued, Historical Med    omeprazole (PRILOSEC) 20 MG capsule Take 20 mg by mouth 2 (two) times daily before a meal., Until Discontinued, Historical Med    nitroGLYCERIN (NITROSTAT) 0.4 MG SL tablet Place 0.4 mg under the tongue every 5 (five) minutes as needed for chest pain. Given by EMS, Until Discontinued, Historical Med         Aspirin prescribed at discharge?  Yes High Intensity Statin Prescribed? (Lipitor 40-80mg  or Crestor 20-40mg ): Yes Beta Blocker Prescribed? Yes For EF 45% or less, Was ACEI/ARB Prescribed? Yes ADP Receptor Inhibitor Prescribed? (i.e. Plavix etc.-Includes Medically Managed Patients): Yes For EF <40%, Aldosterone Inhibitor Prescribed? N/A Was EF assessed during THIS hospitalization? Yes Was Cardiac Rehab II ordered? (Included Medically managed Patients): Yes   Outstanding Labs/Studies   None  Duration of Discharge Encounter   Greater than 30 minutes including physician time.  Signed, Rosabell Geyer PA-C 11/03/2015, 1:10 PM

## 2015-11-03 NOTE — Telephone Encounter (Signed)
TCM phone call. appt is on 11/10/15 at 9am w/ Ward [facility]ris [facility]   Thanks

## 2015-11-03 NOTE — Progress Notes (Signed)
Pt discharged home. Discharge instructions have been gone over with the patient. IV's removed. Pt given unit number and told to call if they have any concerns regarding their discharge instructions. Patient and wife educated on Heart Attack and Medications. RN answered all questions, and told patient to call if he thinks of any new questions. Pt and family verbalized understanding. Cecille Rubinhompson,Braylie Badami V, RN

## 2015-11-03 NOTE — Progress Notes (Signed)
DAILY PROGRESS NOTE  Subjective:  No events overnight. Diuresed about 2L. Breathing back to normal. BNP 41 this morning. Labs stable. He had a short pain in his right hip, that improved with ambulation overnight. I do not feel this is related to lipitor. Benefit of high-dose statin outweighs risk of possible myalgias post-NSTEMI. Discussed residual CAD with him - both areas are distal and best treated medically for now. LVEF has improved by echo to 50-55%. He is eager to go home.  Objective:  Temp:  [97.9 F (36.6 C)-98.5 F (36.9 C)] 97.9 F (36.6 C) (04/25 0300) Pulse Rate:  [67-74] 74 (04/25 0300) Resp:  [18-20] 20 (04/25 0300) BP: (110-140)/(54-73) 120/62 mmHg (04/25 0300) SpO2:  [97 %-98 %] 98 % (04/25 0300) Weight:  [244 lb 8 oz (110.904 kg)] 244 lb 8 oz (110.904 kg) (04/25 0300) Weight change: -6.4 oz (-0.181 kg)  Intake/Output from previous day: 04/24 0701 - 04/25 0700 In: 1560 [P.O.:1560] Out: 2675 [Urine:2675]  Intake/Output from this shift: Total I/O In: 240 [P.O.:240] Out: 225 [Urine:225]  Medications: Current Facility-Administered Medications  Medication Dose Route Frequency Provider Last Rate Last Dose  . 0.9 %  sodium chloride infusion  250 mL Intravenous PRN Belva Crome, MD      . acetaminophen (TYLENOL) tablet 650 mg  650 mg Oral Q4H PRN Belva Crome, MD   650 mg at 11/02/15 0545  . allopurinol (ZYLOPRIM) tablet 100 mg  100 mg Oral Daily Belva Crome, MD   100 mg at 11/03/15 0826  . aspirin chewable tablet 81 mg  81 mg Oral Daily Belva Crome, MD   81 mg at 11/03/15 7026  . atorvastatin (LIPITOR) tablet 40 mg  40 mg Oral q1800 Almyra Deforest, PA   40 mg at 11/02/15 1809  . carvedilol (COREG) tablet 6.25 mg  6.25 mg Oral BID WC Thompson Grayer, MD   6.25 mg at 11/03/15 0824  . clonazePAM (KLONOPIN) tablet 0.5 mg  0.5 mg Oral BID PRN Belva Crome, MD      . enoxaparin (LOVENOX) injection 40 mg  40 mg Subcutaneous Q24H Thompson Grayer, MD   40 mg at 11/03/15 3785    . lisinopril (PRINIVIL,ZESTRIL) tablet 10 mg  10 mg Oral Daily Thompson Grayer, MD   10 mg at 11/03/15 0824  . ondansetron (ZOFRAN) injection 4 mg  4 mg Intravenous Q6H PRN Belva Crome, MD      . pantoprazole (PROTONIX) EC tablet 40 mg  40 mg Oral Q0600 Belva Crome, MD   40 mg at 11/03/15 8850  . promethazine (PHENERGAN) injection 12.5 mg  12.5 mg Intravenous Q6H PRN Clayborne Dana, MD   12.5 mg at 10/30/15 2251  . psyllium (HYDROCIL/METAMUCIL) packet 1 packet  1 packet Oral Daily Pixie Casino, MD   1 packet at 11/03/15 [phone]  . sodium chloride flush (NS) 0.9 % injection 3 mL  3 mL Intravenous Q12H Belva Crome, MD   3 mL at 11/03/15 0826  . sodium chloride flush (NS) 0.9 % injection 3 mL  3 mL Intravenous PRN Belva Crome, MD      . ticagrelor [facility]) tablet 90 mg  90 mg Oral BID Belva Crome, MD   90 mg at 11/03/15 0825  . traMADol (ULTRAM) tablet 50 mg  50 mg Oral Q6H PRN Pixie Casino, MD   50 mg at 11/02/15 2106    Physical Exam: General appearance: alert and  no distress Neck: no carotid bruit and no JVD Lungs: clear to auscultation bilaterally Heart: regular rate and rhythm Abdomen: soft, non-tender; bowel sounds normal; no masses,  no organomegaly Extremities: extremities normal, atraumatic, no cyanosis or edema Pulses: 2+ and symmetric Skin: Skin color, texture, turgor normal. No rashes or lesions Neurologic: Grossly normal  Lab Results: Results for orders placed or performed during the hospital encounter of 10/30/15 (from the past 48 hour(s))  Basic metabolic panel     Status: Abnormal   Collection Time: 11/02/15  4:52 AM  Result Value Ref Range   Sodium 140 135 - 145 mmol/L   Potassium 3.7 3.5 - 5.1 mmol/L   Chloride 104 101 - 111 mmol/L   CO2 25 22 - 32 mmol/L   Glucose, Bld 107 (H) 65 - 99 mg/dL   BUN 14 6 - 20 mg/dL   Creatinine, Ser 1.01 0.61 - 1.24 mg/dL   Calcium 8.8 (L) 8.9 - 10.3 mg/dL   GFR calc non Af Amer >60 >60 mL/min   GFR calc Af Amer >60 >60  mL/min    Comment: (NOTE) The eGFR has been calculated using the CKD EPI equation. This calculation has not been validated in all clinical situations. eGFR's persistently <60 mL/min signify possible Chronic Kidney Disease.    Anion gap 11 5 - 15  Basic metabolic panel     Status: Abnormal   Collection Time: 11/03/15  6:09 AM  Result Value Ref Range   Sodium 138 135 - 145 mmol/L   Potassium 3.6 3.5 - 5.1 mmol/L   Chloride 100 (L) 101 - 111 mmol/L   CO2 26 22 - 32 mmol/L   Glucose, Bld 104 (H) 65 - 99 mg/dL   BUN 17 6 - 20 mg/dL   Creatinine, Ser 1.17 0.61 - 1.24 mg/dL   Calcium 9.1 8.9 - 10.3 mg/dL   GFR calc non Af Amer >60 >60 mL/min   GFR calc Af Amer >60 >60 mL/min    Comment: (NOTE) The eGFR has been calculated using the CKD EPI equation. This calculation has not been validated in all clinical situations. eGFR's persistently <60 mL/min signify possible Chronic Kidney Disease.    Anion gap 12 5 - 15  Brain natriuretic peptide     Status: None   Collection Time: 11/03/15  6:09 AM  Result Value Ref Range   B Natriuretic Peptide 41.1 0.0 - 100.0 pg/mL    Imaging: Imaging results have been reviewed  Assessment:  Principal Problem:   Acute non-ST-elevation myocardial infarction [facility]) Active Problems:   Hyperlipidemia   Essential hypertension   Acute myocardial infarction of anterior wall (HCC)   Acute systolic (congestive) heart failure (HCC)   Constipation   Plan:  1. Dyspnea has resolved with lasix. LVEF has improved to 50-55%, will not likely need lasix at home. Will increase lipitor back to 80 mg daily. Continue ASA and Brillinta at discharge. Also on carvedilol and lisinopril. Change protonix back to omeprazole at discharge. [place] for d/c home today. Follow-up in 7-10 days with MLP and eventually with me in the office.  Time Spent Directly with Patient:  15 minutes   Length of Stay:  LOS: 4 days   Pixie Casino, MD, [facility] Attending Cardiologist [place] 11/03/2015, 8:40 AM

## 2015-11-04 NOTE — Telephone Encounter (Signed)
[facility]  Patient understands to follow up with provider ? 5/2 @ 9am; pt asked arrive 15 mins early; [facility]  Patient understands discharge instructions? Yes; reviewed and reminded of restrictions Patient understands medications and regiment? Yes, pt has created a chart to help keep medications on schedule  Patient understands to bring all medications to this visit? Yes  Walking daily about 10-15 mins., cleared by therapy before leaving hospital, he experienced some chest "soreness" today during walk but stopped after couple hours it went away. He did NOT take a nitro and had not other c/o like dizziness, sob, sweaty, radiating pain when this chest soreness what happening.

## 2015-11-05 ENCOUNTER — Telehealth: Payer: Self-pay | Admitting: Internal Medicine

## 2015-11-05 NOTE — Telephone Encounter (Signed)
Pt is calling in wanting to speak with the nurse about his Brilinta.

## 2015-11-05 NOTE — Telephone Encounter (Signed)
Spoke with patient regarding Brilinta He states he was told he should take his med at 845 at each dose.  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

## 2015-11-09 ENCOUNTER — Encounter: Payer: Self-pay | Admitting: Physician Assistant

## 2015-11-09 ENCOUNTER — Ambulatory Visit (INDEPENDENT_AMBULATORY_CARE_PROVIDER_SITE_OTHER): Payer: BLUE CROSS/BLUE SHIELD | Admitting: Physician Assistant

## 2015-11-09 VITALS — BP 120/70 | HR 68 | Ht 71.0 in | Wt 241.0 lb

## 2015-11-09 DIAGNOSIS — I2510 Atherosclerotic heart disease of native coronary artery without angina pectoris: Secondary | ICD-10-CM | POA: Insufficient documentation

## 2015-11-09 DIAGNOSIS — I2583 Coronary atherosclerosis due to lipid rich plaque: Secondary | ICD-10-CM

## 2015-11-09 DIAGNOSIS — I2109 ST elevation (STEMI) myocardial infarction involving other coronary artery of anterior wall: Secondary | ICD-10-CM

## 2015-11-09 DIAGNOSIS — E785 Hyperlipidemia, unspecified: Secondary | ICD-10-CM

## 2015-11-09 DIAGNOSIS — I519 Heart disease, unspecified: Secondary | ICD-10-CM

## 2015-11-09 DIAGNOSIS — I10 Essential (primary) hypertension: Secondary | ICD-10-CM

## 2015-11-09 DIAGNOSIS — I5189 Other ill-defined heart diseases: Secondary | ICD-10-CM | POA: Insufficient documentation

## 2015-11-09 DIAGNOSIS — E669 Obesity, unspecified: Secondary | ICD-10-CM

## 2015-11-09 MED ORDER — TRAMADOL HCL 50 MG PO TABS: 50.0000 mg | ORAL_TABLET | Freq: Four times a day (QID) | ORAL | Status: DC | PRN

## 2015-11-09 NOTE — Patient Instructions (Signed)
Your physician wants you to follow-up in: 3 Months with Dr Georgeann OppenheimHilty You will receive a reminder letter in the mail two months in advance. If you don't receive a letter, please call our office to schedule the follow-up appointment.  Your physician recommends that you return for lab work in: 8 Weeks Fasting Lipids Liver

## 2015-11-09 NOTE — Progress Notes (Signed)
Patient ID: Patrick CockayneRicky D Bhullar, male   DOB: 25-Jan-1956, 60 y.o.   MRN: [id number]    Date:  11/09/2015   ID:  Patrick CockayneRicky D Bova, DOB 25-Jan-1956, MRN [id number]  PCP:  Ulyess BlossomMORGAN, MELVIN K II, MD  [facility]  [facility]  Chief Complaint  Patient presents with  . Follow-up    some shortness of breath, nervous feeling inside (Klonopin) helps     History of Present Illness: Patrick Wilkins is a 60 y.o. male history of tobacco abuse but no known cardiac history presented 10/30/15 with chest pain. Reports starting around 6pm onset of 10/10 pressure in midchest with diaphoresis and SOB. Symptoms improved but not resolved with SL NG x3, also received ASA in the field and 6mg  of IV morphine. Pain down from 10/10 to 5/10 but not resolved. Brought in as code STEMI by EMS. Initial troponin of 0.11.   Cath with multivessel CAD. S/p PTCA and DES of total occlusion of mid LAD. Continued ASA/ brilinta/ statin/ lisinopril and coreg. Continue to have mild chest discomfort. Due to elevated BP, lisinopril dose was adjusted. The patient was given IV lasix for mild right basilar crackles and EF of 40-45% on cath. Echo showed improved EF to 50-55%, grade 2 DD, trace MR and TR. Dyspnea has resolved with lasix, felt he will not need lasix at home. Changed protonix back to omeprazole at discharge. The patient takes Celebrex for DDD, advised to avoid celebrex/ NSAIDs if able.  Patient presents for posthospital follow-up. He reports doing well. He is walking 15 minutes twice a day without any problems. He is taking Tylenol instead of Celebrex for his back. He has requested tramadol which helped in the hospital. He is taking all medications as prescribed. Did state that he was having some quivering in his heart/nervousness 1 day or so he took his clonazepam and that resolved it.   The patient currently denies nausea, vomiting, fever, chest pain, shortness of breath, orthopnea, dizziness, PND, cough, congestion, abdominal pain,  hematochezia, melena, lower extremity edema, claudication.   Wt Readings from Last 3 Encounters:  11/09/15 241 lb (109.317 kg)  11/03/15 244 lb 8 oz (110.904 kg)     Past Medical History  Diagnosis Date  . Hypertension   . Anxiety   . CAD (coronary artery disease)     a. NSTEMI 10/30/15 s/p PTCA and DES of total occusion of  mid LAD. ostial and proximal LAD 50% stenosis, 50-70% PDA stenosis, and 70% stenosis of the first obtuse marginal.   . Hyperlipidemia     Current Outpatient Prescriptions  Medication Sig Dispense Refill  . allopurinol (ZYLOPRIM) 100 MG tablet Take 100 mg by mouth daily.    Marland Kitchen. aspirin 81 MG tablet Take 81 mg by mouth daily as needed for pain. For chest pain. Given by EMS    . atorvastatin (LIPITOR) 80 MG tablet Take 1 tablet (80 mg total) by mouth daily at 6 PM. 30 tablet 6  . carvedilol (COREG) 6.25 MG tablet Take 1 tablet (6.25 mg total) by mouth 2 (two) times daily with a meal. 60 tablet 11  . clonazePAM (KLONOPIN) 0.5 MG tablet Take 0.5 mg by mouth 2 (two) times daily as needed for anxiety.    Marland Kitchen. lisinopril (PRINIVIL,ZESTRIL) 10 MG tablet Take 1 tablet (10 mg total) by mouth daily. 30 tablet 11  . nitroGLYCERIN (NITROSTAT) 0.4 MG SL tablet Place 1 tablet (0.4 mg total) under the tongue every 5 (five) minutes as needed for chest pain.  Given by EMS 25 tablet 12  . omeprazole (PRILOSEC) 20 MG capsule Take 20 mg by mouth 2 (two) times daily before a meal.    . ticagrelor (BRILINTA) 90 MG TABS tablet Take 1 tablet (90 mg total) by mouth 2 (two) times daily. 60 tablet 0  . vitamin C (ASCORBIC ACID) 500 MG tablet Take 500 mg by mouth daily.    . traMADol (ULTRAM) 50 MG tablet Take 1 tablet (50 mg total) by mouth every 6 (six) hours as needed. 30 tablet 0   No current facility-administered medications for this visit.    Allergies:    Allergies  Allergen Reactions  . Other     [place] nuts: Stop breathing   . Tyloxapol     Itching and vomiting     Social History:   The patient  reports that he has quit smoking. He does not have any smokeless tobacco history on file. He reports that he does not drink alcohol or use illicit drugs.   Family history:  No family history on file.  ROS:  Please see the history of present illness.  All other systems reviewed and negative.   PHYSICAL EXAM: VS:  BP 120/70 mmHg  Pulse 68  Ht  (1.803 m)  Wt 241 lb (109.317 kg)  BMI 33.63 kg/m2 Obese, well developed, in no acute distress HEENT: Pupils are equal round react to light accommodation extraocular movements are intact.  Neck: no JVDNo cervical lymphadenopathy. Cardiac: Regular rate and rhythm without murmurs rubs or gallops. Lungs:  clear to auscultation bilaterally, no wheezing, rhonchi or rales Abd: soft, nontender, positive bowel sounds all quadrants, no hepatosplenomegaly Ext: no lower extremity edema.  2+ radial and dorsalis pedis pulses. Skin: warm and dry Neuro:  Grossly normal  EKG:  Normal sinus rhythm rate 64 bpm T-wave inversion V 2 through 5  ASSESSMENT AND PLAN:  Problem List Items Addressed This Visit    Obesity (BMI 30.0-34.9)   Hyperlipidemia   Relevant Orders   Lipid Profile   Essential hypertension - Primary   Relevant Orders   EKG 12-Lead   Hepatic function panel   Diastolic dysfunction   Coronary artery disease due to lipid rich plaque   Acute myocardial infarction of anterior wall (HCC)   Relevant Orders   EKG 12-Lead   Hepatic function panel      Status post stent to an occluded LAD. Patient is on aspirin, Brilinta, Lipitor, Coreg, lisinopril.  He does have significant diffuse disease elsewhere which will need to be monitored. He will need aggressive lifestyle modifications which we talked about.  Cardiac rehabilitation should be contacting him in approximately 2-3 weeks set up a program. He is eager to participate. He has diastolic dysfunction on his echocardiogram and a low normal ejection fraction. He appears euvolemic  today. We talked about low sodium diet daily weight monitoring.  He will need his LFTs and lipids checked in 5 weeks. Orders were written. I Also prescribed him some as needed tramadol 50 mg for his back. He will follow-up with his primary care provider for refills in the future.

## 2015-11-10 ENCOUNTER — Ambulatory Visit: Payer: BLUE CROSS/BLUE SHIELD | Admitting: Nurse Practitioner

## 2015-11-10 NOTE — Addendum Note (Signed)
Addended by: Dwana MelenaHAGER, Anzel Kearse W on: 11/10/2015 04:16 PM   Modules accepted: Level of Service

## 2015-11-24 ENCOUNTER — Encounter: Payer: Self-pay | Admitting: Physician Assistant

## 2015-11-24 ENCOUNTER — Encounter (HOSPITAL_COMMUNITY): Payer: Self-pay | Admitting: General Practice

## 2015-11-24 ENCOUNTER — Ambulatory Visit (INDEPENDENT_AMBULATORY_CARE_PROVIDER_SITE_OTHER): Payer: BLUE CROSS/BLUE SHIELD | Admitting: Physician Assistant

## 2015-11-24 ENCOUNTER — Telehealth: Payer: Self-pay | Admitting: Internal Medicine

## 2015-11-24 ENCOUNTER — Observation Stay (HOSPITAL_COMMUNITY)
Admission: AD | Admit: 2015-11-24 | Discharge: 2015-11-25 | Disposition: A | Discharge status: Home / Self Care | Payer: BLUE CROSS/BLUE SHIELD | Source: Ambulatory Visit | Attending: Cardiology | Admitting: Cardiology

## 2015-11-24 VITALS — BP 100/60 | HR 62 | Ht 71.0 in | Wt 236.0 lb

## 2015-11-24 DIAGNOSIS — I25110 Atherosclerotic heart disease of native coronary artery with unstable angina pectoris: Secondary | ICD-10-CM | POA: Diagnosis not present

## 2015-11-24 DIAGNOSIS — I2510 Atherosclerotic heart disease of native coronary artery without angina pectoris: Secondary | ICD-10-CM | POA: Diagnosis present

## 2015-11-24 DIAGNOSIS — I5042 Chronic combined systolic (congestive) and diastolic (congestive) heart failure: Secondary | ICD-10-CM

## 2015-11-24 DIAGNOSIS — Z87891 Personal history of nicotine dependence: Secondary | ICD-10-CM | POA: Insufficient documentation

## 2015-11-24 DIAGNOSIS — Z7982 Long term (current) use of aspirin: Secondary | ICD-10-CM | POA: Insufficient documentation

## 2015-11-24 DIAGNOSIS — I110 Hypertensive heart disease with heart failure: Secondary | ICD-10-CM | POA: Diagnosis not present

## 2015-11-24 DIAGNOSIS — F419 Anxiety disorder, unspecified: Secondary | ICD-10-CM | POA: Diagnosis not present

## 2015-11-24 DIAGNOSIS — I2583 Coronary atherosclerosis due to lipid rich plaque: Secondary | ICD-10-CM

## 2015-11-24 DIAGNOSIS — I252 Old myocardial infarction: Secondary | ICD-10-CM | POA: Diagnosis not present

## 2015-11-24 DIAGNOSIS — E785 Hyperlipidemia, unspecified: Secondary | ICD-10-CM

## 2015-11-24 DIAGNOSIS — R079 Chest pain, unspecified: Secondary | ICD-10-CM | POA: Diagnosis present

## 2015-11-24 DIAGNOSIS — R0789 Other chest pain: Secondary | ICD-10-CM | POA: Diagnosis not present

## 2015-11-24 DIAGNOSIS — I10 Essential (primary) hypertension: Secondary | ICD-10-CM | POA: Diagnosis present

## 2015-11-24 DIAGNOSIS — I200 Unstable angina: Secondary | ICD-10-CM | POA: Diagnosis not present

## 2015-11-24 DIAGNOSIS — Z888 Allergy status to other drugs, medicaments and biological substances status: Secondary | ICD-10-CM | POA: Diagnosis not present

## 2015-11-24 DIAGNOSIS — E782 Mixed hyperlipidemia: Secondary | ICD-10-CM | POA: Diagnosis present

## 2015-11-24 HISTORY — DX: Non-ST elevation (NSTEMI) myocardial infarction: I21.4

## 2015-11-24 HISTORY — DX: Unspecified osteoarthritis, unspecified site: M19.90

## 2015-11-24 HISTORY — DX: Unspecified asthma, uncomplicated: J45.909

## 2015-11-24 HISTORY — DX: Calculus of kidney: N20.0

## 2015-11-24 HISTORY — DX: Heart failure, unspecified: I50.9

## 2015-11-24 HISTORY — DX: Respiratory tuberculosis unspecified: A15.9

## 2015-11-24 HISTORY — DX: Adverse effect of unspecified anesthetic, initial encounter: T41.45XA

## 2015-11-24 HISTORY — DX: Gastro-esophageal reflux disease without esophagitis: K21.9

## 2015-11-24 LAB — APTT: APTT: 40 s — AB (ref 24–37)

## 2015-11-24 LAB — CBC WITH DIFFERENTIAL/PLATELET
BASOS ABS: 0 10*3/uL (ref 0.0–0.1)
BASOS PCT: 0 %
EOS ABS: 0.3 10*3/uL (ref 0.0–0.7)
EOS PCT: 3 %
HCT: 41 % (ref 39.0–52.0)
Hemoglobin: 13.2 g/dL (ref 13.0–17.0)
LYMPHS PCT: 25 %
Lymphs Abs: 2.1 10*3/uL (ref 0.7–4.0)
MCH: 27.7 pg (ref 26.0–34.0)
MCHC: 32.2 g/dL (ref 30.0–36.0)
MCV: 86 fL (ref 78.0–100.0)
MONO ABS: 0.7 10*3/uL (ref 0.1–1.0)
Monocytes Relative: 8 %
Neutro Abs: 5.5 10*3/uL (ref 1.7–7.7)
Neutrophils Relative %: 64 %
PLATELETS: 249 10*3/uL (ref 150–400)
RBC: 4.77 MIL/uL (ref 4.22–5.81)
RDW: 13.1 % (ref 11.5–15.5)
WBC: 8.6 10*3/uL (ref 4.0–10.5)

## 2015-11-24 LAB — COMPREHENSIVE METABOLIC PANEL
ALBUMIN: 4 g/dL (ref 3.5–5.0)
ALT: 30 U/L (ref 17–63)
ANION GAP: 9 (ref 5–15)
AST: 28 U/L (ref 15–41)
Alkaline Phosphatase: 77 U/L (ref 38–126)
BUN: 7 mg/dL (ref 6–20)
CHLORIDE: 102 mmol/L (ref 101–111)
CO2: 28 mmol/L (ref 22–32)
Calcium: 9.5 mg/dL (ref 8.9–10.3)
Creatinine, Ser: 1.15 mg/dL (ref 0.61–1.24)
GFR calc Af Amer: >60 mL/min (ref >60)
GFR calc non Af Amer: >60 mL/min (ref >60)
GLUCOSE: 94 mg/dL (ref 65–99)
POTASSIUM: 4 mmol/L (ref 3.5–5.1)
Sodium: 139 mmol/L (ref 135–145)
Total Bilirubin: 0.9 mg/dL (ref 0.3–1.2)
Total Protein: 6.9 g/dL (ref 6.5–8.1)

## 2015-11-24 LAB — PROTIME-INR
INR: 1.21 (ref 0.00–1.49)
PROTHROMBIN TIME: 15.5 s — AB (ref 11.6–15.2)

## 2015-11-24 LAB — TROPONIN I

## 2015-11-24 LAB — BRAIN NATRIURETIC PEPTIDE: B NATRIURETIC PEPTIDE 5: 36.7 pg/mL (ref 0.0–100.0)

## 2015-11-24 MED ORDER — ACETAMINOPHEN 325 MG PO TABS: 650.0000 mg | ORAL_TABLET | ORAL | Status: DC | PRN

## 2015-11-24 MED ORDER — SODIUM CHLORIDE 0.9% FLUSH: 3.0000 mL | INTRAVENOUS | Status: DC | PRN

## 2015-11-24 MED ORDER — SODIUM CHLORIDE 0.9 % IV SOLN: 250.0000 mL | INTRAVENOUS | Status: DC | PRN

## 2015-11-24 MED ORDER — TICAGRELOR 90 MG PO TABS
90.0000 mg | ORAL_TABLET | Freq: Two times a day (BID) | ORAL | Status: DC
  Administered 2015-11-24 – 2015-11-25 (×2): 90 mg via ORAL
  Filled 2015-11-24 (×2): qty 1

## 2015-11-24 MED ORDER — ONDANSETRON HCL 4 MG/2ML IJ SOLN: 4.0000 mg | Freq: Four times a day (QID) | INTRAMUSCULAR | Status: DC | PRN

## 2015-11-24 MED ORDER — CLONAZEPAM 0.5 MG PO TABS: 0.5000 mg | ORAL_TABLET | Freq: Two times a day (BID) | ORAL | Status: DC | PRN

## 2015-11-24 MED ORDER — CARVEDILOL 6.25 MG PO TABS: 6.2500 mg | ORAL_TABLET | Freq: Two times a day (BID) | ORAL | Status: DC

## 2015-11-24 MED ORDER — TRAMADOL HCL 50 MG PO TABS: 50.0000 mg | ORAL_TABLET | Freq: Four times a day (QID) | ORAL | Status: DC | PRN

## 2015-11-24 MED ORDER — SODIUM CHLORIDE 0.9% FLUSH
3.0000 mL | Freq: Two times a day (BID) | INTRAVENOUS | Status: DC
  Administered 2015-11-24 – 2015-11-25 (×2): 3 mL via INTRAVENOUS

## 2015-11-24 MED ORDER — ENOXAPARIN SODIUM 40 MG/0.4ML ~~LOC~~ SOLN
40.0000 mg | SUBCUTANEOUS | Status: DC
  Administered 2015-11-24 – 2015-11-25 (×2): 40 mg via SUBCUTANEOUS
  Filled 2015-11-24 (×2): qty 0.4

## 2015-11-24 MED ORDER — ALLOPURINOL 100 MG PO TABS
100.0000 mg | ORAL_TABLET | Freq: Every day | ORAL | Status: DC
  Administered 2015-11-25: 100 mg via ORAL
  Filled 2015-11-24: qty 1

## 2015-11-24 MED ORDER — ATORVASTATIN CALCIUM 80 MG PO TABS
80.0000 mg | ORAL_TABLET | Freq: Every day | ORAL | Status: DC
  Administered 2015-11-24 – 2015-11-25 (×2): 80 mg via ORAL
  Filled 2015-11-24 (×3): qty 1

## 2015-11-24 MED ORDER — LISINOPRIL 10 MG PO TABS
10.0000 mg | ORAL_TABLET | Freq: Every day | ORAL | Status: DC
  Filled 2015-11-24: qty 1

## 2015-11-24 MED ORDER — PANTOPRAZOLE SODIUM 40 MG PO TBEC
40.0000 mg | DELAYED_RELEASE_TABLET | Freq: Every day | ORAL | Status: DC
  Administered 2015-11-25: 40 mg via ORAL
  Filled 2015-11-24: qty 1

## 2015-11-24 MED ORDER — CARVEDILOL 6.25 MG PO TABS
6.2500 mg | ORAL_TABLET | Freq: Two times a day (BID) | ORAL | Status: DC
  Administered 2015-11-24 – 2015-11-25 (×2): 6.25 mg via ORAL
  Filled 2015-11-24 (×3): qty 1

## 2015-11-24 MED ORDER — ASPIRIN EC 81 MG PO TBEC
81.0000 mg | DELAYED_RELEASE_TABLET | Freq: Every day | ORAL | Status: DC
  Administered 2015-11-25: 81 mg via ORAL
  Filled 2015-11-24: qty 1

## 2015-11-24 MED ORDER — NITROGLYCERIN 0.4 MG SL SUBL
0.4000 mg | SUBLINGUAL_TABLET | SUBLINGUAL | Status: DC | PRN
Start: 2015-11-24 — End: 2015-11-25

## 2015-11-24 NOTE — Patient Instructions (Signed)
YOU ARE BEING ADMITTED TO [facility]  

## 2015-11-24 NOTE — Telephone Encounter (Signed)
New Message  Pt c/o heaviness- specifically stated not painful- in his chest- stated he just walked for about 30 minutes and the discomfort started after. Pt denies SOB now

## 2015-11-24 NOTE — Telephone Encounter (Signed)
Spoke with pt, the heaviness has almost completely gone away. It has gone from generalized pain to localized in the center of the chest. It is the same area as his pain from his MI. He would like to be seen today for reassurance. He reports if he lies back in the recliner the discomfort eases and then when he sits upright it returns. He will see the flex today, he will have his dtr drive him. Patient voiced understanding if the discomfort returns or gets worse to call 911.

## 2015-11-24 NOTE — Telephone Encounter (Signed)
Spoke with pt, he has been walking daily since 5-10. He walked this morning for about 30 min. After returning home and resting he has developed a heaviness in his chest. He reports it feels like a heavy blanket lying across him. He denies SOB or diaphoresis, no radiation. The asked the patient to take a NTG. Discussed with dr Rennis Goldenhilty, will call pt back to see if he has gotten relief.

## 2015-11-24 NOTE — Progress Notes (Signed)
Cardiology Office Note:    Date:  11/24/2015   ID:  Patrick Wilkins, DOB Jul 19, 1955, MRN [id number]  PCP:  Patrick Blossom, MD  Cardiologist:  Dr. K. [place] Hilty   Electrophysiologist:  n/a  Referring MD: Patrick Blossom, MD   Chief Complaint  Patient presents with  . Chest Pain    History of Present Illness:     Patrick Wilkins is a 60 y.o. male with a hx of tobacco abuse.  He was admitted in 4/17 with non-STEMI. LHC demonstrated reduced LV function with an EF of 40-45%, elevated LVEDP and total occlusion of the mid LAD. LAD was treated with DES. He had residual ostial/proximal LAD stenosis of 50% as well as PDA stenosis 50-70% and OM1 stenosis 70%. Follow-up echo post MI demonstrated EF 50-55%.  He was seen in FU 11/09/15 with Patrick Finlay, PA-C.    He called in today with complaints of chest discomfort and is added on for evaluation.  He has been walking every day.  Today, approximately 1 hour after walking for 30 minutes, he developed heaviness in his chest that lasted for 30 minutes.  He took NTG and his symptoms resolved much later.  He has not had symptoms like this since his MI.  He did have some more intense CP that reminded him of his MI.  This was not as bad as his MI.  He had more discomfort while sitting in the waiting room today for 10 minutes.  He again has had some more while I interviewed him.  Again, this is very mild compared to his prior angina.  He denies orthopnea, PND, edema. Denies syncope. He has noted a lot of belching.  Denies any bleeding issues.     Past Medical History  Diagnosis Date  . Hypertension   . Anxiety   . CAD (coronary artery disease)     a. NSTEMI 10/30/15 s/p PTCA and DES of total occusion of  mid LAD. ostial and proximal LAD 50% stenosis, 50-70% PDA stenosis, and 70% stenosis of the first obtuse marginal.   . Hyperlipidemia     Past Surgical History  Procedure Laterality Date  . Cholecystectomy    . Hernia repair    . Cardiac  catheterization N/A 10/30/2015    Procedure: Left Heart Cath and Coronary Angiography;  Surgeon: Lyn Records, MD;  Location: [facility] INVASIVE CV LAB;  Service: Cardiovascular;  Laterality: N/A;  . Cardiac catheterization N/A 10/30/2015    Procedure: Coronary Stent Intervention;  Surgeon: Lyn Records, MD;  Location: [facility] INVASIVE CV LAB;  Service: Cardiovascular;  Laterality: N/A;    Current Medications: Outpatient Prescriptions Prior to Visit  Medication Sig Dispense Refill  . allopurinol (ZYLOPRIM) 100 MG tablet Take 100 mg by mouth daily.    Marland Kitchen aspirin 81 MG tablet Take 81 mg by mouth daily as needed for pain. For chest pain. Given by EMS    . atorvastatin (LIPITOR) 80 MG tablet Take 1 tablet (80 mg total) by mouth daily at 6 PM. 30 tablet 6  . carvedilol (COREG) 6.25 MG tablet Take 1 tablet (6.25 mg total) by mouth 2 (two) times daily with a meal. 60 tablet 11  . clonazePAM (KLONOPIN) 0.5 MG tablet Take 0.5 mg by mouth 2 (two) times daily as needed for anxiety.    Marland Kitchen lisinopril (PRINIVIL,ZESTRIL) 10 MG tablet Take 1 tablet (10 mg total) by mouth daily. 30 tablet 11  . nitroGLYCERIN (NITROSTAT) 0.4 MG SL  tablet Place 1 tablet (0.4 mg total) under the tongue every 5 (five) minutes as needed for chest pain. Given by EMS 25 tablet 12  . omeprazole (PRILOSEC) 20 MG capsule Take 20 mg by mouth 2 (two) times daily before a meal.    . ticagrelor (BRILINTA) 90 MG TABS tablet Take 1 tablet (90 mg total) by mouth 2 (two) times daily. 60 tablet 0  . traMADol (ULTRAM) 50 MG tablet Take 1 tablet (50 mg total) by mouth every 6 (six) hours as needed. 30 tablet 0  . vitamin C (ASCORBIC ACID) 500 MG tablet Take 500 mg by mouth daily.     No facility-administered medications prior to visit.      Allergies:   Other and Tyloxapol   Social History   Social History  . Marital Status: Married    Spouse Name: N/A  . Number of Children: N/A  . Years of Education: N/A   Social History Main Topics  . Smoking  status: Former Games developer  . Smokeless tobacco: None  . Alcohol Use: No  . Drug Use: No  . Sexual Activity: Not Asked   Other Topics Concern  . None   Social History Narrative     Family History:  The patient's Family history is unknown by patient.   ROS:   Please see the history of present illness.    Review of Systems  Cardiovascular: Positive for chest pain and dyspnea on exertion.   All other systems reviewed and are negative.   Physical Exam:    VS:  BP 100/60 mmHg  Pulse 62  Ht 5\' 11"  (1.803 m)  Wt 236 lb (107.049 kg)  BMI 32.93 kg/m2   GEN: Well nourished, well developed, in no acute distress HEENT: normal Neck: no JVD, no masses Cardiac: Normal S1/S2, RRR; no murmurs, rubs, or gallops, no edema;     Respiratory:  clear to auscultation bilaterally; no wheezing, rhonchi or rales GI: soft, nontender, nondistended MS: no deformity or atrophy Skin: warm and dry Neuro: No focal deficits  Psych: Alert and oriented x 3, normal affect  Wt Readings from Last 3 Encounters:  11/24/15 236 lb (107.049 kg)  11/09/15 241 lb (109.317 kg)  11/03/15 244 lb 8 oz (110.904 kg)      Studies/Labs Reviewed:     EKG:  EKG is  ordered today.  The ekg ordered today demonstrates NSR, HR 63, normal axis, NSSTTW changes, QTc 399 ms  Recent Labs: 10/30/2015: ALT 27 10/31/2015: Hemoglobin 14.0; Platelets 280 11/03/2015: B Natriuretic Peptide 41.1; BUN 17; Creatinine, Ser 1.17; Potassium 3.6; Sodium 138   Recent Lipid Panel    Component Value Date/Time   CHOL 171 11/01/2015 0504   TRIG 173* 11/01/2015 0504   HDL 36* 11/01/2015 0504   CHOLHDL 4.8 11/01/2015 0504   VLDL 35 11/01/2015 0504   LDLCALC 100* 11/01/2015 0504    Additional studies/ records that were reviewed today include:   Echo 11/02/15 EF 50-55%, normal wall motion, grade 2 diastolic dysfunction  LHC 10/30/15 LM 45% LAD ostial 60%, mid 100%, ostial D1 75% LCx mid 60%, OM1 70% RCA proximal 25%, RPDA 70% EF 35-45%  with apical akinesis, elevated LVEDP PCI: 2.75 x 18 mm Resolute integrity DES to the mid LAD      ASSESSMENT:     1. Coronary artery disease involving native coronary artery of native heart with unstable angina pectoris (HCC)   2. Chronic combined systolic and diastolic CHF (congestive heart failure) (HCC)  3. Essential hypertension   4. Hyperlipidemia     PLAN:     In order of problems listed above:  1. CAD - Patient presents with chest discomfort reminiscent of his prior angina.  Current symptoms are not as severe and his ECG is not changed (actually improved).  He does have significant residual disease.  Question if he is having symptoms from his residual CAD.  BP too soft to add nitrates.  I have recommended admission for observation and re-look cardiac catheterization.  He may need FFR to help guide +/- PCI.  Discussed with Dr. Donato SchultzMark Skains (DOD).   -  Admit for observation  -  Continue current meds with ASA, Brilinta, beta-blocker, high dose statin.  -  Cycle enzymes  -  DVT dose Lovenox >> change to IV Heparin if CEs elevated.  -  Consider cardiac cath tomorrow (will keep NPO) vs adjust medical Rx.   2. Chronic Systolic/Diastolic HF - EF improved post MI.  Volume stable. Continue beta-blocker, ACE inhibitor.   3. HTN - BP controlled.   4. HL - Continue Lipitor 80.    Medication Adjustments/Labs and Tests Ordered: Current medicines are reviewed at length with the patient today.  Concerns regarding medicines are outlined above.  Medication changes, Labs and Tests ordered today are outlined in the Patient Instructions noted below. Patient Instructions  YOU ARE BEING ADMITTED TO [facility]   Signed, Tereso NewcomerScott Okley Magnussen, PA-C  11/24/2015 5:00 PM    [facility] [address], [place], [place]  [address] Phone: [phone]; Fax: [phone]

## 2015-11-24 NOTE — H&P (Addendum)
History and Physical:     Date:  11/24/2015   ID:  Patrick Wilkins, DOB Jul 28, 1955, MRN [id number]  PCP:  [name], MD  Cardiologist:  Dr. K. [place] Hilty   Electrophysiologist:  n/a  Referring MD: [name], MD   Chief Complaint  Patient presents with  . Chest Pain        History of Present Illness:     Patrick Wilkins is a 60 y.o. male with a hx of tobacco abuse.  He was admitted in 4/17 with non-STEMI. LHC demonstrated reduced LV function with an EF of 40-45%, elevated LVEDP and total occlusion of the mid LAD. LAD was treated with DES. He had residual ostial/proximal LAD stenosis of 50% as well as PDA stenosis 50-70% and OM1 stenosis 70%. Follow-up echo post MI demonstrated EF 50-55%.  He was seen in FU 11/09/15 with Wilburt Finlay, PA-C.    He called in today with complaints of chest discomfort.  He has been walking every day.  Today, approximately 1 hour after walking for 30 minutes, he developed heaviness in his chest that lasted for 30 minutes.  He took NTG and his symptoms resolved much later.  He has not had symptoms like this since his MI and this concerned him.  He did have some more intense CP that reminded him of his MI.  This was not as bad as his MI.  He had more discomfort while sitting in the waiting room today for 10 minutes.  Again, this is very mild compared to his prior angina.  He denies orthopnea, PND, edema. Denies syncope. He has noted a lot of belching.  Denies any bleeding issues.    Wife and daughter are present during examination and discussion.   Past Medical History  Diagnosis Date  . Hypertension   . Anxiety   . CAD (coronary artery disease)     a. NSTEMI 10/30/15 s/p PTCA and DES of total occusion of  mid LAD. ostial and proximal LAD 50% stenosis, 50-70% PDA stenosis, and 70% stenosis of the first obtuse marginal.   . Hyperlipidemia     Past Surgical History  Procedure Laterality Date  . Cholecystectomy    . Hernia repair    .  Cardiac catheterization N/A 10/30/2015    Procedure: Left Heart Cath and Coronary Angiography;  Surgeon: Lyn Records, MD;  Location: [facility] INVASIVE CV LAB;  Service: Cardiovascular;  Laterality: N/A;  . Cardiac catheterization N/A 10/30/2015    Procedure: Coronary Stent Intervention;  Surgeon: Lyn Records, MD;  Location: [facility] INVASIVE CV LAB;  Service: Cardiovascular;  Laterality: N/A;    Current Medications: Outpatient Prescriptions Prior to Visit  Medication Sig Dispense Refill  . allopurinol (ZYLOPRIM) 100 MG tablet Take 100 mg by mouth daily.    Marland Kitchen aspirin 81 MG tablet Take 81 mg by mouth daily as needed for pain. For chest pain. Given by EMS    . atorvastatin (LIPITOR) 80 MG tablet Take 1 tablet (80 mg total) by mouth daily at 6 PM. 30 tablet 6  . carvedilol (COREG) 6.25 MG tablet Take 1 tablet (6.25 mg total) by mouth 2 (two) times daily with a meal. 60 tablet 11  . clonazePAM (KLONOPIN) 0.5 MG tablet Take 0.5 mg by mouth 2 (two) times daily as needed for anxiety.    Marland Kitchen lisinopril (PRINIVIL,ZESTRIL) 10 MG tablet Take 1 tablet (10 mg total) by mouth daily. 30 tablet 11  . nitroGLYCERIN (NITROSTAT)  0.4 MG SL tablet Place 1 tablet (0.4 mg total) under the tongue every 5 (five) minutes as needed for chest pain. Given by EMS 25 tablet 12  . omeprazole (PRILOSEC) 20 MG capsule Take 20 mg by mouth 2 (two) times daily before a meal.    . ticagrelor (BRILINTA) 90 MG TABS tablet Take 1 tablet (90 mg total) by mouth 2 (two) times daily. 60 tablet 0  . traMADol (ULTRAM) 50 MG tablet Take 1 tablet (50 mg total) by mouth every 6 (six) hours as needed. 30 tablet 0  . vitamin C (ASCORBIC ACID) 500 MG tablet Take 500 mg by mouth daily.     No facility-administered medications prior to visit.      Allergies:   Other and Tyloxapol   Social History   Social History  . Marital Status: Married    Spouse Name: N/A  . Number of Children: N/A  . Years of Education: N/A   Social History Main Topics  .  Smoking status: Former Games developer  . Smokeless tobacco: None  . Alcohol Use: No  . Drug Use: No  . Sexual Activity: Not Asked   Other Topics Concern  . None   Social History Narrative     Family History:  The patient's Family history is unknown by patient.   ROS:   Please see the history of present illness.    Review of Systems  Cardiovascular: Positive for chest pain and dyspnea on exertion.   All other systems reviewed and are negative.   Physical Exam:    VS:  BP 100/60 mmHg  Pulse 62  Ht  (1.803 m)  Wt 236 lb (107.049 kg)  BMI 32.93 kg/m2   GEN: Well nourished, well developed, in no acute distress HEENT: normal Neck: no JVD, no masses Cardiac: Normal S1/S2, RRR; no murmurs, rubs, or gallops, no edema;     Respiratory:  clear to auscultation bilaterally; no wheezing, rhonchi or rales GI: soft, nontender, nondistended, overweight MS: no deformity or atrophy Skin: warm and dry, 2+ radial artery, prior right radial artery catheterization site noted. Neuro: No focal deficits  Psych: Alert and oriented x 3, normal affect  Wt Readings from Last 3 Encounters:  11/24/15 236 lb (107.049 kg)  11/09/15 241 lb (109.317 kg)  11/03/15 244 lb 8 oz (110.904 kg)      Studies/Labs Reviewed:     EKG:  EKG is  ordered today.  The ekg ordered today demonstrates NSR, HR 63, normal axis, NSSTTW changes, QTc 399 ms  Recent Labs: 10/30/2015: ALT 27 10/31/2015: Hemoglobin 14.0; Platelets 280 11/03/2015: B Natriuretic Peptide 41.1; BUN 17; Creatinine, Ser 1.17; Potassium 3.6; Sodium 138   Recent Lipid Panel    Component Value Date/Time   CHOL 171 11/01/2015 0504   TRIG 173* 11/01/2015 0504   HDL 36* 11/01/2015 0504   CHOLHDL 4.8 11/01/2015 0504   VLDL 35 11/01/2015 0504   LDLCALC 100* 11/01/2015 0504    Additional studies/ records that were reviewed today include:   Echo 11/02/15 EF 50-55%, normal wall motion, grade 2 diastolic dysfunction  LHC 10/30/15 LM 45% LAD ostial  60%, mid 100%, ostial D1 75% LCx mid 60%, OM1 70% RCA proximal 25%, RPDA 70% EF 35-45% with apical akinesis, elevated LVEDP PCI: 2.75 x 18 mm Resolute integrity DES to the mid LAD      ASSESSMENT:     1. Coronary artery disease involving native coronary artery of native heart with unstable angina pectoris (HCC)  2. Chronic combined systolic and diastolic CHF (congestive heart failure) (HCC)   3. Essential hypertension   4. Hyperlipidemia     PLAN:     In order of problems listed above:  1. CAD - Patient presents with chest discomfort reminiscent of his prior angina.  Current symptoms are not as severe 1-2/10 compared to 12/10 previously he states and his ECG is not changed (actually improved, less T-wave inversion).  He does have significant residual disease.  Question if he is having symptoms from his residual CAD.  BP too soft At this point to add nitrates.  I have recommended admission for observation and possible repeat cardiac catheterization.  He may need FFR to help guide +/- PCI.  He does admit that it may be hard for him to discern what his actual chest wall pain versus cardiac discomfort. After lengthy discussion with he and his family, we decided to proceed with observation.  -  Admit for observation  -  Continue current meds with ASA, Brilinta, beta-blocker, high dose statin.  -  Cycle enzymes  -  DVT dose Lovenox >> change to IV Heparin if CEs elevated.  -  Consider cardiac cath tomorrow (will keep NPO) vs adjusting medical Rx.   2. Chronic Systolic/Diastolic HF - EF improved post MI on echocardiogram.  Volume stable. Continue beta-blocker, ACE inhibitor.   3. HTN - BP controlled.   4. HL - Continue Lipitor 80. High-dose.   Medication Adjustments/Labs and Tests Ordered: Current medicines are reviewed at length with the patient today.  Concerns regarding medicines are outlined above.  Medication changes, Labs and Tests ordered today are outlined in the Patient  Instructions noted below. There are no Patient Instructions on file for this visit. Signed, Donato SchultzMark Amelia Macken, MD I saw in conjunction with Tereso NewcomerScott Weaver, [place] 11/24/2015 3:38 PM    [facility] [address], [place], [place]  [address] Phone: [phone](336) [phone]; Fax: [phone]

## 2015-11-25 ENCOUNTER — Observation Stay (HOSPITAL_BASED_OUTPATIENT_CLINIC_OR_DEPARTMENT_OTHER): Payer: BLUE CROSS/BLUE SHIELD

## 2015-11-25 ENCOUNTER — Observation Stay (HOSPITAL_COMMUNITY): Payer: BLUE CROSS/BLUE SHIELD

## 2015-11-25 DIAGNOSIS — I10 Essential (primary) hypertension: Secondary | ICD-10-CM | POA: Diagnosis not present

## 2015-11-25 DIAGNOSIS — I25110 Atherosclerotic heart disease of native coronary artery with unstable angina pectoris: Secondary | ICD-10-CM | POA: Diagnosis not present

## 2015-11-25 DIAGNOSIS — R079 Chest pain, unspecified: Secondary | ICD-10-CM | POA: Insufficient documentation

## 2015-11-25 DIAGNOSIS — I5042 Chronic combined systolic (congestive) and diastolic (congestive) heart failure: Secondary | ICD-10-CM

## 2015-11-25 DIAGNOSIS — I2510 Atherosclerotic heart disease of native coronary artery without angina pectoris: Secondary | ICD-10-CM | POA: Diagnosis not present

## 2015-11-25 DIAGNOSIS — I110 Hypertensive heart disease with heart failure: Secondary | ICD-10-CM | POA: Diagnosis not present

## 2015-11-25 LAB — NM MYOCAR MULTI W/SPECT W/WALL MOTION / EF
Estimated workload: 9.2 METS
Exercise duration (min): 7 min
Exercise duration (sec): 31 s
LV dias vol: 77 mL (ref 62–150)
LV sys vol: 36 mL
MPHR: 136 {beats}/min
Peak HR: 157 {beats}/min
Percent HR: 97 %
Percent of predicted max HR: 97 %
RATE: 0.4
Rest HR: 71 {beats}/min
SDS: 0
SRS: 7
SSS: 7
Stage 1 DBP: 65 mmHg
Stage 1 Grade: 0 %
Stage 1 HR: 73 {beats}/min
Stage 1 SBP: 108 mmHg
Stage 1 Speed: 0 mph
Stage 2 Grade: 0 %
Stage 2 HR: 78 {beats}/min
Stage 2 Speed: 0 mph
Stage 3 Grade: 0 %
Stage 3 HR: 75 {beats}/min
Stage 3 Speed: 0.9 mph
Stage 4 Grade: 0 %
Stage 4 HR: 75 {beats}/min
Stage 4 Speed: 1 mph
Stage 5 DBP: 52 mmHg
Stage 5 Grade: 10 %
Stage 5 HR: 96 {beats}/min
Stage 5 SBP: 149 mmHg
Stage 5 Speed: 1.7 mph
Stage 6 DBP: 80 mmHg
Stage 6 Grade: 12 %
Stage 6 HR: 130 {beats}/min
Stage 6 SBP: 196 mmHg
Stage 6 Speed: 2.4 mph
Stage 7 Grade: 14 %
Stage 7 HR: 157 {beats}/min
Stage 7 Speed: 3.4 mph
Stage 8 DBP: 74 mmHg
Stage 8 Grade: 0 %
Stage 8 HR: 131 {beats}/min
Stage 8 SBP: 167 mmHg
Stage 8 Speed: 0 mph
Stage 9 DBP: 60 mmHg
Stage 9 Grade: 0 %
Stage 9 HR: 89 {beats}/min
Stage 9 SBP: 154 mmHg
Stage 9 Speed: 0 mph
TID: 0.97

## 2015-11-25 LAB — TROPONIN I: Troponin I: <0.03 ng/mL (ref <0.031)

## 2015-11-25 MED ORDER — TECHNETIUM TC 99M TETROFOSMIN IV KIT
30.0000 | PACK | Freq: Once | INTRAVENOUS | Status: AC | PRN
  Administered 2015-11-25: 30 via INTRAVENOUS

## 2015-11-25 MED ORDER — ISOSORBIDE MONONITRATE ER 30 MG PO TB24
15.0000 mg | ORAL_TABLET | Freq: Every day | ORAL | Status: DC
  Administered 2015-11-25: 15 mg via ORAL
  Filled 2015-11-25: qty 1

## 2015-11-25 MED ORDER — ISOSORBIDE MONONITRATE ER 30 MG PO TB24: 15.0000 mg | ORAL_TABLET | Freq: Every day | ORAL | Status: DC

## 2015-11-25 MED ORDER — TECHNETIUM TC 99M TETROFOSMIN IV KIT
10.0000 | PACK | Freq: Once | INTRAVENOUS | Status: AC | PRN
  Administered 2015-11-25: 10 via INTRAVENOUS

## 2015-11-25 NOTE — Progress Notes (Signed)
    Patient presented to [facility] for treadmill stress test. Tolerated well. Results pending.   Laverda PageLindsay Dimetri Armitage NP-C

## 2015-11-25 NOTE — Progress Notes (Signed)
All d/c instructions explained and given to pt.  Verbalized understanding.  D/c off floor at 1905 via w/c to awaiting transport.  Amanda PeaNellie Xavien Dauphinais, Charity fundraiserN.

## 2015-11-25 NOTE — Progress Notes (Signed)
Patient Name: Patrick CockayneRicky D Wilkins Date of Encounter: 11/25/2015  Hospital Problem List     Principal Problem:   Unstable angina [facility](HCC) Active Problems:   Hyperlipidemia   Essential hypertension   Chronic combined systolic and diastolic CHF (congestive heart failure) (HCC)   Coronary artery disease due to lipid rich plaque    Subjective   Feeling okay this morning. Did have one minimal episode of fleeting chest pressure early morning.   Inpatient Medications    . allopurinol  100 mg Oral Daily  . aspirin EC  81 mg Oral Daily  . atorvastatin  80 mg Oral q1800  . carvedilol  6.25 mg Oral BID WC  . enoxaparin (LOVENOX) injection  40 mg Subcutaneous Q24H  . lisinopril  10 mg Oral Daily  . pantoprazole  40 mg Oral Daily  . sodium chloride flush  3 mL Intravenous Q12H  . ticagrelor  90 mg Oral BID    Vital Signs    Filed Vitals:   11/24/15 1954 11/25/15 0519 11/25/15 0737 11/25/15 0924  BP: 131/69 106/59 119/70 122/65  Pulse: 65 70 66 63  Temp: 98.2 F (36.8 C) 98.2 F (36.8 C) 98 F (36.7 C) 98 F (36.7 C)  TempSrc: Oral Oral Oral Oral  Resp: 18 18 18    Height:  5\' 11"  (1.803 m)    Weight:  231 lb 3.2 oz (104.872 kg)    SpO2: 100% 98% 95%     Intake/Output Summary (Last 24 hours) at 11/25/15 1000 Last data filed at 11/25/15 0000  Gross per 24 hour  Intake      0 ml  Output    600 ml  Net   -600 ml   Filed Weights   11/24/15 1738 11/25/15 0519  Weight: 233 lb 11 oz (106 kg) 231 lb 3.2 oz (104.872 kg)    Physical Exam    General: Pleasant, NAD. Neuro: Alert and oriented X 3. Moves all extremities spontaneously. Psych: Normal affect. HEENT:  Normal  Neck: Supple without bruits or JVD. Lungs:  Resp regular and unlabored, CTA. Heart: RRR no s3, s4, or murmurs. Abdomen: Soft, non-tender, non-distended, BS + x 4.  Extremities: No clubbing, cyanosis or edema. DP/PT/Radials 2+ and equal bilaterally.  Labs    CBC  Recent Labs  11/24/15 1812  WBC 8.6    NEUTROABS 5.5  HGB 13.2  HCT 41.0  MCV 86.0  PLT 249   Basic Metabolic Panel  Recent Labs  11/24/15 1812  NA 139  K 4.0  CL 102  CO2 28  GLUCOSE 94  BUN 7  CREATININE 1.15  CALCIUM 9.5   Liver Function Tests  Recent Labs  11/24/15 1812  AST 28  ALT 30  ALKPHOS 77  BILITOT 0.9  PROT 6.9  ALBUMIN 4.0   Cardiac Enzymes  Recent Labs  11/24/15 1812 11/24/15 2327 11/25/15 0630  TROPONINI <0.03 <0.03 <0.03    Telemetry    SR Rate-60s  ECG    SR Rate-68. No specific T wave abnormality that has been noted on previous EKG.   Radiology    Dg Chest Port 1 View  10/30/2015  CLINICAL DATA:  Chest pain for 1 day. Acute ST elevated myocardial infarct. EXAM: PORTABLE CHEST 1 VIEW COMPARISON:  None. FINDINGS: The heart size and mediastinal contours are within normal limits. Lordotic positioning noted. Both lungs are clear. Old right fifth rib fracture deformity noted. IMPRESSION: No active disease. Electronically Signed   By: Myles RosenthalJohn  Stahl  M.D.   On: 10/30/2015 20:09    Assessment & Plan    1. CAD s/p NSTEMI 4/17 with DES to LAD: Pt was sent as a direct admit from the office after being seen by Tereso Newcomer. Presented c/o chest discomfort about an hour after he had been walking which lasted for about 30 minutes. States he did take HTG and symptoms did eventually resolve. Called the office and advised to come in for appt. Was admitted to telemetry for obseveration. Reports the pain is similar to the angina he experienced back in April but much less intense.  --Does have known coronary disease, but reported symptoms are minimal in comparison to his NSTEMI back in April. --On DAPT --Only one episode of fleeting chest pain early this morning, rated a 1/10, and no associated symptoms.  --Given his troponins have negative x3, no EKG changes have been noted, VSS, I would suggest starting with a stress test. I have discussed this with the patient and his wife at the bedside, and  they are agreeable to starting with this option.   2. HTN: controlled with current medications.  3. HLD: On Statin  4. CHF: Appears euvolemic on exam. Last EF was 55% back in April  Signed, Laverda Page NP-C Pager [phone]

## 2015-11-25 NOTE — Discharge Summary (Signed)
Discharge Summary    Patient ID: Patrick Wilkins,  MRN: [id number], DOB/AGE: Jan 25, 1956 60 y.o.  Admit date: 11/24/2015 Discharge date: 11/25/2015  Primary Care Provider: Teola Bradley II Primary Cardiologist: Dr. Rennis Golden  Discharge Diagnoses    Principal Problem:   Unstable angina [facility]) Active Problems:   Hyperlipidemia   Essential hypertension   Chronic combined systolic and diastolic CHF (congestive heart failure) (HCC)   Coronary artery disease due to lipid rich plaque   Chest pain   Chest pain with moderate risk for cardiac etiology   Allergies Allergies  Allergen Reactions  . Other     [place] nuts: Stop breathing   . Tyloxapol     Itching and vomiting     Diagnostic Studies/Procedures    Lexiscan Stress Test: 11/25/2015  Study Result      Blood pressure demonstrated a hypertensive response to exercise.  There was no ST segment deviation noted during stress.  This is a low risk study.  The left ventricular ejection fraction is mildly decreased (45-54%).  1. Normal exercise stress ECG response.  2. Fixed small, mild apical perfusion defect. Given normal wall motion, this may be due to attenuation. No ischemia.  3. EF 53% but visually appears normal.  4. Overall, low risk study.     _____________   History of Present Illness     Patrick Wilkins is a 60 y.o. male with a hx of tobacco abuse. He was admitted in 4/17 with non-STEMI. LHC demonstrated reduced LV function with an EF of 40-45%, elevated LVEDP and total occlusion of the mid LAD. LAD was treated with DES. He had residual ostial/proximal LAD stenosis of 50% as well as PDA stenosis 50-70% and OM1 stenosis 70%. Follow-up echo post MI demonstrated EF 50-55%. We presented to the office 11/24/2015 for c/o chest pressure.   Hospital Course     Patrick Wilkins presented to the office on 11/24/2015 after reporting central chest pressure after walking his normal 30 minute routine that morning. Reports pain  started about 1 hour after activity. He became concerned and called the office, was instructed to take a SL NTG, which did eventually relieve the pain much later. He made a return call to the office and was told to come in for evaluation. At the visit it was determined he should be a direct admit for observation. On 11/25/2015 he was assessed and noted to have neg trop x3, no recurrent episodes of chest pressure, along with all other labs stable. Decision was made to preform a exercise stress test. It was determined he was low risk. Plan to start patient on low dose Imdur  daily. On DAPT at this time. Labs and vital signs stable for discharge.   Was seen and assessed by Dr. Herbie Baltimore and determined stable for discharge.   _____________  Discharge Vitals Blood pressure 133/59, pulse 72, temperature 98.5 F (36.9 C), temperature source Oral, resp. rate 20, height  (1.803 m), weight 231 lb 3.2 oz (104.872 kg), SpO2 98 %.  Filed Weights   11/24/15 1738 11/25/15 0519  Weight: 233 lb 11 oz (106 kg) 231 lb 3.2 oz (104.872 kg)    Labs & Radiologic Studies    CBC  Recent Labs  11/24/15 1812  WBC 8.6  NEUTROABS 5.5  HGB 13.2  HCT 41.0  MCV 86.0  PLT 249   Basic Metabolic Panel  Recent Labs  11/24/15 1812  NA 139  K 4.0  CL 102  CO2 28  GLUCOSE 94  BUN 7  CREATININE 1.15  CALCIUM 9.5   Liver Function Tests  Recent Labs  11/24/15 1812  AST 28  ALT 30  ALKPHOS 77  BILITOT 0.9  PROT 6.9  ALBUMIN 4.0   Cardiac Enzymes  Recent Labs  11/24/15 1812 11/24/15 2327 11/25/15 0630  TROPONINI <0.03 <0.03 <0.03   _____________  Nm Myocar Multi W/spect W/wall Motion / Ef  11/25/2015   Blood pressure demonstrated a hypertensive response to exercise.  There was no ST segment deviation noted during stress.  This is a low risk study.  The left ventricular ejection fraction is mildly decreased (45-54%).  1. Normal exercise stress ECG response. 2. Fixed small, mild apical  perfusion defect.  Given normal wall motion, this may be due to attenuation.  No ischemia. 3. EF 53% but visually appears normal. 4. Overall, low risk study.     Disposition   Pt is being discharged home today in good condition.  Follow-up Plans & Appointments    Message Sent to office for scheduling.   Discharge Instructions    Diet - low sodium heart healthy    Complete by:  As directed      Increase activity slowly    Complete by:  As directed            Discharge Medications   Current Discharge Medication List    START taking these medications   Details  isosorbide mononitrate (IMDUR) 30 MG 24 hr tablet Take 0.5 tablets (15 mg total) by mouth daily. Qty: 15 tablet, Refills: 11      CONTINUE these medications which have NOT CHANGED   Details  allopurinol (ZYLOPRIM) 100 MG tablet Take 100 mg by mouth daily.    aspirin 81 MG tablet Take 81 mg by mouth daily as needed for pain. For chest pain. Given by EMS    atorvastatin (LIPITOR) 80 MG tablet Take 1 tablet (80 mg total) by mouth daily at 6 PM. Qty: 30 tablet, Refills: 6    carvedilol (COREG) 6.25 MG tablet Take 1 tablet (6.25 mg total) by mouth 2 (two) times daily with a meal. Qty: 60 tablet, Refills: 11    lisinopril (PRINIVIL,ZESTRIL) 10 MG tablet Take 1 tablet (10 mg total) by mouth daily. Qty: 30 tablet, Refills: 11    omeprazole (PRILOSEC) 20 MG capsule Take 20 mg by mouth 2 (two) times daily before a meal.    ticagrelor (BRILINTA) 90 MG TABS tablet Take 1 tablet (90 mg total) by mouth 2 (two) times daily. Qty: 60 tablet, Refills: 0    vitamin C (ASCORBIC ACID) 500 MG tablet Take 500 mg by mouth daily.    clonazePAM (KLONOPIN) 0.5 MG tablet Take 0.5 mg by mouth 2 (two) times daily as needed for anxiety.    nitroGLYCERIN (NITROSTAT) 0.4 MG SL tablet Place 1 tablet (0.4 mg total) under the tongue every 5 (five) minutes as needed for chest pain. Given by EMS Qty: 25 tablet, Refills: 12      STOP taking  these medications     traMADol (ULTRAM) 50 MG tablet           Outstanding Labs/Studies   None  Duration of Discharge Encounter   Greater than 30 minutes including physician time.  Signed, Laverda Page NP-C 11/25/2015, 5:42 PM   Attending Attestation  I saw evaluated the patient this morning. He was admitted under observation status for an episode of chest discomfort after exercising. It sounded  relatively atypical, and potentially not related to his initial cardiac anginal symptom. He is relatively close to the recent MI, however I think probably the best option was to evaluate these symptoms with a noninvasive evaluation using a Myoview. His Myoview was read as low risk with a small fixed apical perfusion defect that likely is related to his anterior MI. There is no sign of ischemia and either the lateral or inferior walls which would correlate with his moderate lesions.  He could've had some post exercise related coronary spasm. I will therefore start him on low-dose Imdur. Otherwise would continue current regimen. He should feel fine starting with cardiac rehabilitation as originally scheduled. He is doing outstanding by himself already.   He was fine for discharge.  Bryan Lemmaavid Harding, M.D., M.S. Interventional Cardiologist   Pager # [phone] Phone # [phone] [address] [place], [place] [address]

## 2015-12-08 ENCOUNTER — Encounter (HOSPITAL_COMMUNITY)
Admission: RE | Admit: 2015-12-08 | Discharge: 2015-12-08 | Disposition: A | Discharge status: Home / Self Care | Payer: BLUE CROSS/BLUE SHIELD | Source: Ambulatory Visit | Attending: Internal Medicine | Admitting: Internal Medicine

## 2015-12-08 ENCOUNTER — Ambulatory Visit (INDEPENDENT_AMBULATORY_CARE_PROVIDER_SITE_OTHER): Payer: BLUE CROSS/BLUE SHIELD | Admitting: Internal Medicine

## 2015-12-08 ENCOUNTER — Encounter: Payer: Self-pay | Admitting: Internal Medicine

## 2015-12-08 VITALS — BP 121/68 | HR 64 | Ht 71.0 in | Wt 234.4 lb

## 2015-12-08 VITALS — BP 124/80 | HR 68 | Ht 71.0 in | Wt 235.9 lb

## 2015-12-08 DIAGNOSIS — I214 Non-ST elevation (NSTEMI) myocardial infarction: Secondary | ICD-10-CM | POA: Diagnosis not present

## 2015-12-08 DIAGNOSIS — I110 Hypertensive heart disease with heart failure: Secondary | ICD-10-CM | POA: Insufficient documentation

## 2015-12-08 DIAGNOSIS — E785 Hyperlipidemia, unspecified: Secondary | ICD-10-CM | POA: Diagnosis not present

## 2015-12-08 DIAGNOSIS — K219 Gastro-esophageal reflux disease without esophagitis: Secondary | ICD-10-CM | POA: Insufficient documentation

## 2015-12-08 DIAGNOSIS — Z87891 Personal history of nicotine dependence: Secondary | ICD-10-CM | POA: Insufficient documentation

## 2015-12-08 DIAGNOSIS — I10 Essential (primary) hypertension: Secondary | ICD-10-CM

## 2015-12-08 DIAGNOSIS — I2109 ST elevation (STEMI) myocardial infarction involving other coronary artery of anterior wall: Secondary | ICD-10-CM

## 2015-12-08 DIAGNOSIS — I5042 Chronic combined systolic (congestive) and diastolic (congestive) heart failure: Secondary | ICD-10-CM

## 2015-12-08 DIAGNOSIS — E669 Obesity, unspecified: Secondary | ICD-10-CM

## 2015-12-08 DIAGNOSIS — Z7982 Long term (current) use of aspirin: Secondary | ICD-10-CM | POA: Diagnosis not present

## 2015-12-08 DIAGNOSIS — I2510 Atherosclerotic heart disease of native coronary artery without angina pectoris: Secondary | ICD-10-CM | POA: Insufficient documentation

## 2015-12-08 DIAGNOSIS — I509 Heart failure, unspecified: Secondary | ICD-10-CM | POA: Insufficient documentation

## 2015-12-08 DIAGNOSIS — Z955 Presence of coronary angioplasty implant and graft: Secondary | ICD-10-CM

## 2015-12-08 DIAGNOSIS — Z79899 Other long term (current) drug therapy: Secondary | ICD-10-CM | POA: Diagnosis not present

## 2015-12-08 NOTE — Patient Instructions (Signed)
Your physician has recommended you make the following change in your medication: STOP imdur  Dr. Rennis GoldenHilty said it is OK to take Bactrim   Your physician recommends that you schedule a follow-up appointment in: THREE MONTHS with Dr. Rennis GoldenHilty

## 2015-12-08 NOTE — Progress Notes (Signed)
Patient ID: Patrick Wilkins, male   DOB: October 21, 1955, 60 y.o.   MRN: [id number]    Date:  12/08/2015   ID:  Patrick Wilkins, DOB 14-Jul-1955, MRN [id number]  PCP:  [name], MD  Primary Cardiologist:  [facility]  Chief Complaint  Patient presents with  . Follow-up    post hospital cath/stent (saw [name], PA 11/24/15); dizzy with quick position changes; wants to know if OK to take sulfamethoxazole-TMP DS tablets     History of Present Illness: Patrick Wilkins is a 60 y.o. male history of tobacco abuse but no known cardiac history presented 10/30/15 with chest pain. Reports starting around 6pm onset of 10/10 pressure in midchest with diaphoresis and SOB. Symptoms improved but not resolved with SL NG x3, also received ASA in the field and 6mg  of IV morphine. Pain down from 10/10 to 5/10 but not resolved. Brought in as code STEMI by EMS. Initial troponin of 0.11.   Cath with multivessel CAD. S/p PTCA and DES of total occlusion of mid LAD. Continued ASA/ brilinta/ statin/ lisinopril and coreg. Continue to have mild chest discomfort. Due to elevated BP, lisinopril dose was adjusted. The patient was given IV lasix for mild right basilar crackles and EF of 40-45% on cath. Echo showed improved EF to 50-55%, grade 2 DD, trace MR and TR. Dyspnea has resolved with lasix, felt he will not need lasix at home. Changed protonix back to omeprazole at discharge. The patient takes Celebrex for DDD, advised to avoid celebrex/ NSAIDs if able.  Patient presents for posthospital follow-up. He reports doing well. He is walking 15 minutes twice a day without any problems. He is taking Tylenol instead of Celebrex for his back. He has requested tramadol which helped in the hospital. He is taking all medications as prescribed. Did state that he was having some quivering in his heart/nervousness 1 day or so he took his clonazepam and that resolved it.   The patient currently denies nausea, vomiting, fever, chest pain,  shortness of breath, orthopnea, dizziness, PND, cough, congestion, abdominal pain, hematochezia, melena, lower extremity edema, claudication.  12/08/2015  [name] for follow-up. He was seen after his recent acute MI by 2 physician assistants. The second office visit was for acute chest pain. There was concern as he had some moderate lesions on That were not revascularized that he may be having additional ischemia. He was admitted and ruled out for MI. He underwent nuclear stress testing which was negative for ischemia. He was then discharged. He says he has had no further chest pain symptoms. He is on low-dose indoor 15 mg daily. He's been compliant with aspirin and Brilinta and is exercising on his own. He says disease consider cardiac rehabilitation however it would cost him over $100 per session which is quite expensive. He may likely be able to do this exercise on his own. Today's asking if he could use Bactrim as needed for what sounds like recurrent abscesses. There is no issue that I have with it. He does get some dizziness with quick position changes. He denies any further chest pain.   Wt Readings from Last 3 Encounters:  12/08/15 235 lb 14.3 oz (107 kg)  12/08/15 234 lb 6.4 oz (106.323 kg)  11/25/15 231 lb 3.2 oz (104.872 kg)     Past Medical History  Diagnosis Date  . Hypertension   . Anxiety   . CAD (coronary artery disease)     a. NSTEMI  10/30/15 s/p PTCA and DES of total occusion of  mid LAD. ostial and proximal LAD 50% stenosis, 50-70% PDA stenosis, and 70% stenosis of the first obtuse marginal.   . Hyperlipidemia   . NSTEMI (non-ST elevated myocardial infarction) (HCC) 10/2015  . Kidney stones   . Tuberculosis 1958  . Complication of anesthesia 1958    "died on the table when they did OR to dx TB"  . CHF (congestive heart failure) (HCC) 10/2015  . Childhood asthma   . GERD (gastroesophageal reflux disease)   . Arthritis     "right knee, back" (11/24/2015)     Current Outpatient Prescriptions  Medication Sig Dispense Refill  . allopurinol (ZYLOPRIM) 100 MG tablet Take 100 mg by mouth daily.    Marland Kitchen. aspirin 81 MG tablet Take 81 mg by mouth daily as needed for pain. For chest pain. Given by EMS    . atorvastatin (LIPITOR) 80 MG tablet Take 1 tablet (80 mg total) by mouth daily at 6 PM. 30 tablet 6  . carvedilol (COREG) 6.25 MG tablet Take 1 tablet (6.25 mg total) by mouth 2 (two) times daily with a meal. 60 tablet 11  . clonazePAM (KLONOPIN) 0.5 MG tablet Take 0.5 mg by mouth 2 (two) times daily as needed for anxiety.    Marland Kitchen. lisinopril (PRINIVIL,ZESTRIL) 10 MG tablet Take 1 tablet (10 mg total) by mouth daily. 30 tablet 11  . nitroGLYCERIN (NITROSTAT) 0.4 MG SL tablet Place 1 tablet (0.4 mg total) under the tongue every 5 (five) minutes as needed for chest pain. Given by EMS 25 tablet 12  . omeprazole (PRILOSEC) 20 MG capsule Take 40 mg by mouth 2 (two) times daily before a meal.     . ticagrelor (BRILINTA) 90 MG TABS tablet Take 1 tablet (90 mg total) by mouth 2 (two) times daily. 60 tablet 0  . traMADol (ULTRAM) 50 MG tablet Take 50 mg by mouth every 6 (six) hours as needed.  0  . vitamin C (ASCORBIC ACID) 500 MG tablet Take 500 mg by mouth daily.    . psyllium (METAMUCIL) 58.6 % packet Take 1 packet by mouth at bedtime.    . sulfamethoxazole-trimethoprim (BACTRIM DS,SEPTRA DS) 800-160 MG tablet Take 1 tablet by mouth 2 (two) times daily. Takes PRN for a few days when he feels a "boil" coming on - ok with cardiologist     No current facility-administered medications for this visit.    Allergies:    Allergies  Allergen Reactions  . Other Anaphylaxis    [place] nuts: Stop breathing   . Tyloxapol Itching and Nausea And Vomiting    Itching and vomiting  Tolerates other forms of oxycodone    Social History:  The patient  reports that he has quit smoking. His smoking use included Cigarettes. He has a 5 pack-year smoking history. He has quit using  smokeless tobacco. His smokeless tobacco use included Chew. He reports that he drinks alcohol. He reports that he does not use illicit drugs.   Family history:   Family History  Problem Relation Age of Onset  . Family history unknown: Yes    ROS:  Please see the history of present illness.  All other systems reviewed and negative.   PHYSICAL EXAM: VS:  BP 121/68 mmHg  Pulse 64  Ht 5\' 11"  (1.803 m)  Wt 234 lb 6.4 oz (106.323 kg)  BMI 32.71 kg/m2 Obese, well developed, in no acute distress HEENT: Pupils are equal round react to light  accommodation extraocular movements are intact.  Neck: no JVDNo cervical lymphadenopathy. Cardiac: Regular rate and rhythm without murmurs rubs or gallops. Lungs:  clear to auscultation bilaterally, no wheezing, rhonchi or rales Abd: soft, nontender, positive bowel sounds all quadrants, no hepatosplenomegaly Ext: no lower extremity edema.  2+ radial and dorsalis pedis pulses. Skin: warm and dry Neuro:  Grossly normal  EKG:   Deferred  ASSESSMENT:  Problem List Items Addressed This Visit    Hyperlipidemia   Essential hypertension   Acute myocardial infarction of anterior wall (HCC) - Primary   Chronic combined systolic and diastolic CHF (congestive heart failure) (HCC)   Obesity (BMI 30.0-34.9)     PLAN:  Status post stent to an occluded LAD. Patient is on aspirin, Brilinta, Lipitor, Coreg, lisinopril.  He does have significant diffuse disease elsewhere which will need to be monitored. He had some recurrent chest pain however nuclear stress testing failed to show any reversible ischemia. He seems to be responding to medical therapy. I've encouraged him to continue with walking and light exercise. Hopefully he can work on some additional weight loss. Since his nuclear stress test was negative, I think we can consider stopping his low-dose isosorbide. This may be causing some of his positional dizziness. I like to see him back in 3 months for close  follow-up.  Chrystie Nose, MD, [facility] Attending Cardiologist [facility]

## 2015-12-08 NOTE — Progress Notes (Signed)
Cardiac Rehab Medication Review by a Pharmacist  Does the patient  feel that his/her medications are working for him/her?  yes  Has the patient been experiencing any side effects to the medications prescribed?  Yes; headaches from Imdur, MD holding for now  Does the patient measure his/her own blood pressure or blood glucose at home?  yes   Does the patient have any problems obtaining medications due to transportation or finances?   no  Understanding of regimen: excellent Understanding of indications: excellent Potential of compliance: excellent   Pharmacist comments: Patient is very clear on his regimen, records the time that he is taking his medications along with his blood pressure. Discussed side effects to monitor for (muscle pain with Lipitor). Answered his questions.   Patrick JanskyMeagan Riana Wilkins, PharmD Clinical Pharmacy Resident Pager: [phone] 12/08/2015 2:29 PM

## 2015-12-09 ENCOUNTER — Telehealth (HOSPITAL_COMMUNITY): Payer: Self-pay | Admitting: Cardiac Rehabilitation

## 2015-12-09 NOTE — Progress Notes (Signed)
Cardiac Individual Treatment Plan  Patient Details  Name: Patrick Wilkins MRN: [id number] Date of Birth: May 15, 1956 Referring Provider:        CARDIAC REHAB PHASE II OR[facility]ATION [facility]2015 [facility]   Referring Provider  Hilty, [place] MD      Initial Encounter Date:       CARDIAC REHAB PHASE II ORIENTATION from 12/08/2015 [facility]   Date  12/08/15   Referring Provider  Hilty, [place] MD      Visit Diagnosis: NSTEMI (non-ST elevated myocardial infarction) (HCC)  Stented coronary artery  Patient's Home Medications on Admission:  Current outpatient prescriptions:  .  allopurinol (ZYLOPRIM) 100 MG tablet, Take 100 mg by mouth daily., Disp: , Rfl:  .  aspirin 81 MG tablet, Take 81 mg by mouth daily as needed for pain. For chest pain. Given by EMS, Disp: , Rfl:  .  atorvastatin (LIPITOR) 80 MG tablet, Take 1 tablet (80 mg total) by mouth daily at 6 PM., Disp: 30 tablet, Rfl: 6 .  carvedilol (COREG) 6.25 MG tablet, Take 1 tablet (6.25 mg total) by mouth 2 (two) times daily with a meal., Disp: 60 tablet, Rfl: 11 .  clonazePAM (KLONOPIN) 0.5 MG tablet, Take 0.5 mg by mouth 2 (two) times daily as needed for anxiety., Disp: , Rfl:  .  lisinopril (PRINIVIL,ZESTRIL) 10 MG tablet, Take 1 tablet (10 mg total) by mouth daily., Disp: 30 tablet, Rfl: 11 .  nitroGLYCERIN (NITROSTAT) 0.4 MG SL tablet, Place 1 tablet (0.4 mg total) under the tongue every 5 (five) minutes as needed for chest pain. Given by EMS, Disp: 25 tablet, Rfl: 12 .  omeprazole (PRILOSEC) 20 MG capsule, Take 40 mg by mouth 2 (two) times daily before a meal. , Disp: , Rfl:  .  psyllium (METAMUCIL) 58.6 % packet, Take 1 packet by mouth at bedtime., Disp: , Rfl:  .  sulfamethoxazole-trimethoprim (BACTRIM DS,SEPTRA DS) 800-160 MG tablet, Take 1 tablet by mouth 2 (two) times daily. Takes PRN for a few days when he feels a "boil" coming on - ok with cardiologist, Disp:  , Rfl:  .  ticagrelor (BRILINTA) 90 MG TABS tablet, Take 1 tablet (90 mg total) by mouth 2 (two) times daily., Disp: 60 tablet, Rfl: 0 .  traMADol (ULTRAM) 50 MG tablet, Take 50 mg by mouth every 6 (six) hours as needed., Disp: , Rfl: 0 .  vitamin C (ASCORBIC ACID) 500 MG tablet, Take 500 mg by mouth daily., Disp: , Rfl:   Past Medical History: Past Medical History  Diagnosis Date  . Hypertension   . Anxiety   . CAD (coronary artery disease)     a. NSTEMI 10/30/15 s/p PTCA and DES of total occusion of  mid LAD. ostial and proximal LAD 50% stenosis, 50-70% PDA stenosis, and 70% stenosis of the first obtuse marginal.   . Hyperlipidemia   . NSTEMI (non-ST elevated myocardial infarction) (HCC) 10/2015  . Kidney stones   . Tuberculosis 1958  . Complication of anesthesia 1958    "died on the table when they did OR to dx TB"  . CHF (congestive heart failure) (HCC) 10/2015  . Childhood asthma   . GERD (gastroesophageal reflux disease)   . Arthritis     "right knee, back" (11/24/2015)    Tobacco Use: History  Smoking status  . Former Smoker -- 1.00 packs/day for 5 years  . Types: Cigarettes  Smokeless tobacco  .  Former Neurosurgeon  . Types: Chew    Comment: "quit smoking cigarettes & chewing tobacco in my 20s"    Labs: Recent Review Flowsheet Data    Labs for ITP Cardiac and Pulmonary Rehab Latest Ref Rng 11/01/2015   Cholestrol 0 - 200 mg/dL 161   LDLCALC 0 - 99 mg/dL 096(E)   HDL >45 mg/dL 40(J)   Trlycerides <811 mg/dL 914(N)      Capillary Blood Glucose: No results found for: GLUCAP   Exercise Target Goals: Date: 12/08/15  Exercise Program Goal: Individual exercise prescription set with THRR, safety & activity barriers. Participant demonstrates ability to understand and report RPE using BORG scale, to self-measure pulse accurately, and to acknowledge the importance of the exercise prescription.  Exercise Prescription Goal: Starting with aerobic activity 30 plus minutes a day,  3 days per week for initial exercise prescription. Provide home exercise prescription and guidelines that participant acknowledges understanding prior to discharge.  Activity Barriers & Risk Stratification:     Activity Barriers & Cardiac Risk Stratification - 12/08/15 1651    Activity Barriers & Cardiac Risk Stratification   Activity Barriers Back Problems;Other (comment)   Comments Hx torn cartilage in right knee   Cardiac Risk Stratification High      6 Minute Walk:     6 Minute Walk      12/08/15 1700       6 Minute Walk   Phase Initial     Distance 1549 feet     Walk Time 6 minutes     # of Rest Breaks 0     MPH 2.93     METS 3.49     RPE 11     VO2 Peak 12.22     Symptoms No     Resting HR 68 bpm     Resting BP 124/80 mmHg     Max Ex. HR 90 bpm     Max Ex. BP 122/72 mmHg     2 Minute Post BP 100/66 mmHg        Initial Exercise Prescription:     Initial Exercise Prescription - 12/08/15 1700    Date of Initial Exercise RX and Referring Provider   Date 12/08/15   Referring Provider Hilty, [place] MD   Bike   Level 1.4   Minutes 10   METs 3.46   NuStep   Level 3   Minutes 10   METs 2.8   Track   Laps 11   Minutes 10   METs 2.92   Prescription Details   Frequency (times per week) 3   Duration Progress to 30 minutes of continuous aerobic without signs/symptoms of physical distress   Intensity   THRR 40-80% of Max Heartrate 64-129   Ratings of Perceived Exertion 11-13   Perceived Dyspnea 0-4   Progression   Progression Continue to progress workloads to maintain intensity without signs/symptoms of physical distress.   Resistance Training   Training Prescription Yes   Weight 2-3lbs.   Reps 10-12      Perform Capillary Blood Glucose checks as needed.  Exercise Prescription Changes:   Exercise Comments:   Discharge Exercise Prescription (Final Exercise Prescription Changes):   Nutrition:  Target Goals: Understanding of nutrition guidelines,  daily intake of sodium 1500mg , cholesterol 200mg , calories 30% from fat and 7% or less from saturated fats, daily to have 5 or more servings of fruits and vegetables.  Biometrics:     Pre Biometrics - 12/08/15 1653  Pre Biometrics   Waist Circumference 47.75 inches   Hip Circumference 46.75 inches   Waist to Hip Ratio 1.02 %   Triceps Skinfold 18.5 mm   % Body Fat 33.4 %   Grip Strength 43.5 kg   Flexibility 0 in   Single Leg Stand 30 seconds       Nutrition Therapy Plan and Nutrition Goals:   Nutrition Discharge: Nutrition Scores:   Nutrition Goals Re-Evaluation:   Psychosocial: Target Goals: Acknowledge presence or absence of depression, maximize coping skills, provide positive support system. Participant is able to verbalize types and ability to use techniques and skills needed for reducing stress and depression.  Initial Review & Psychosocial Screening:   Quality of Life Scores:     Quality of Life - 12/08/15 1659    Quality of Life Scores   Health/Function Pre 24.57 %   Socioeconomic Pre 27.5 %   Psych/Spiritual Pre 28.21 %   Family Pre 27.6 %   GLOBAL Pre 26.33 %      PHQ-9:     Recent Review Flowsheet Data    There is no flowsheet data to display.      Psychosocial Evaluation and Intervention:   Psychosocial Re-Evaluation:   Vocational Rehabilitation: Provide vocational rehab assistance to qualifying candidates.   Vocational Rehab Evaluation & Intervention:     Vocational Rehab - 12/09/15 1008    Initial Vocational Rehab Evaluation & Intervention   Assessment shows need for Vocational Rehabilitation No      Education: Education Goals: Education classes will be provided on a weekly basis, covering required topics. Participant will state understanding/return demonstration of topics presented.  Learning Barriers/Preferences:     Learning Barriers/Preferences - 12/08/15 1651    Learning Barriers/Preferences   Learning Barriers  None   Learning Preferences Written Material;Verbal Instruction;Skilled Demonstration;Video;Group Instruction;Individual Instruction      Education Topics: Count Your Pulse:  -Group instruction provided by verbal instruction, demonstration, patient participation and written materials to support subject.  Instructors address importance of being able to find your pulse and how to count your pulse when at home without a heart monitor.  Patients get hands on experience counting their pulse with staff help and individually.   Heart Attack, Angina, and Risk Factor Modification:  -Group instruction provided by verbal instruction, video, and written materials to support subject.  Instructors address signs and symptoms of angina and heart attacks.    Also discuss risk factors for heart disease and how to make changes to improve heart health risk factors.   Functional Fitness:  -Group instruction provided by verbal instruction, demonstration, patient participation, and written materials to support subject.  Instructors address safety measures for doing things around the house.  Discuss how to get up and down off the floor, how to pick things up properly, how to safely get out of a chair without assistance, and balance training.   Meditation and Mindfulness:  -Group instruction provided by verbal instruction, patient participation, and written materials to support subject.  Instructor addresses importance of mindfulness and meditation practice to help reduce stress and improve awareness.  Instructor also leads participants through a meditation exercise.    Stretching for Flexibility and Mobility:  -Group instruction provided by verbal instruction, patient participation, and written materials to support subject.  Instructors lead participants through series of stretches that are designed to increase flexibility thus improving mobility.  These stretches are additional exercise for major muscle groups that  are typically performed during regular warm up and  cool down.   Hands Only CPR Anytime:  -Group instruction provided by verbal instruction, video, patient participation and written materials to support subject.  Instructors co-teach with AHA video for hands only CPR.  Participants get hands on experience with mannequins.   Nutrition I class: Heart Healthy Eating:  -Group instruction provided by PowerPoint slides, verbal discussion, and written materials to support subject matter. The instructor gives an explanation and review of the Therapeutic Lifestyle Changes diet recommendations, which includes a discussion on lipid goals, dietary fat, sodium, fiber, plant stanol/sterol esters, sugar, and the components of a well-balanced, healthy diet.   Nutrition II class: Lifestyle Skills:  -Group instruction provided by PowerPoint slides, verbal discussion, and written materials to support subject matter. The instructor gives an explanation and review of label reading, grocery shopping for heart health, heart healthy recipe modifications, and ways to make healthier choices when eating out.   Diabetes Question & Answer:  -Group instruction provided by PowerPoint slides, verbal discussion, and written materials to support subject matter. The instructor gives an explanation and review of diabetes co-morbidities, pre- and post-prandial blood glucose goals, pre-exercise blood glucose goals, signs, symptoms, and treatment of hypoglycemia and hyperglycemia, and foot care basics.   Diabetes Blitz:  -Group instruction provided by PowerPoint slides, verbal discussion, and written materials to support subject matter. The instructor gives an explanation and review of the physiology behind type 1 and type 2 diabetes, diabetes medications and rational behind using different medications, pre- and post-prandial blood glucose recommendations and Hemoglobin A1c goals, diabetes diet, and exercise including blood glucose  guidelines for exercising safely.    Portion Distortion:  -Group instruction provided by PowerPoint slides, verbal discussion, written materials, and food models to support subject matter. The instructor gives an explanation of serving size versus portion size, changes in portions sizes over the last 20 years, and what consists of a serving from each food group.   Stress Management:  -Group instruction provided by verbal instruction, video, and written materials to support subject matter.  Instructors review role of stress in heart disease and how to cope with stress positively.     Exercising on Your Own:  -Group instruction provided by verbal instruction, power point, and written materials to support subject.  Instructors discuss benefits of exercise, components of exercise, frequency and intensity of exercise, and end points for exercise.  Also discuss use of nitroglycerin and activating EMS.  Review options of places to exercise outside of rehab.  Review guidelines for sex with heart disease.   Cardiac Drugs I:  -Group instruction provided by verbal instruction and written materials to support subject.  Instructor reviews cardiac drug classes: antiplatelets, anticoagulants, beta blockers, and statins.  Instructor discusses reasons, side effects, and lifestyle considerations for each drug class.   Cardiac Drugs II:  -Group instruction provided by verbal instruction and written materials to support subject.  Instructor reviews cardiac drug classes: angiotensin converting enzyme inhibitors (ACE-I), angiotensin II receptor blockers (ARBs), nitrates, and calcium channel blockers.  Instructor discusses reasons, side effects, and lifestyle considerations for each drug class.   Anatomy and Physiology of the Circulatory System:  -Group instruction provided by verbal instruction, video, and written materials to support subject.  Reviews functional anatomy of heart, how it relates to various  diagnoses, and what role the heart plays in the overall system.   Knowledge Questionnaire Score:     Knowledge Questionnaire Score - 12/08/15 1652    Knowledge Questionnaire Score   Pre Score 18/24  Core Components/Risk Factors/Patient Goals at Admission:     Personal Goals and Risk Factors at Admission - 12/08/15 1654    Core Components/Risk Factors/Patient Goals on Admission    Weight Management Yes;Weight Loss   Intervention Weight Management: Develop a combined nutrition and exercise program designed to reach desired caloric intake, while maintaining appropriate intake of nutrient and fiber, sodium and fats, and appropriate energy expenditure required for the weight goal.;Weight Management: Provide education and appropriate resources to help participant work on and attain dietary goals.;Weight Management/Obesity: Establish reasonable short term and long term weight goals.;Obesity: Provide education and appropriate resources to help participant work on and attain dietary goals.   Admit Weight 235 lb 14.3 oz (107 kg)   Goal Weight: Short Term 229 lb (103.874 kg)   Goal Weight: Long Term 200 lb (90.719 kg)   Expected Outcomes Short Term: Continue to assess and modify interventions until short term weight is achieved;Long Term: Adherence to nutrition and physical activity/exercise program aimed toward attainment of established weight goal;Weight Maintenance: Understanding of the daily nutrition guidelines, which includes 25-35% calories from fat, 7% or less cal from saturated fats, less than 200mg  cholesterol, less than 1.5gm of sodium, & 5 or more servings of fruits and vegetables daily;Weight Loss: Understanding of general recommendations for a balanced deficit meal plan, which promotes 1-2 lb weight loss per week and includes a negative energy balance of [phone] kcal/d;Understanding recommendations for meals to include 15-35% energy as protein, 25-35% energy from fat, 35-60% energy from  carbohydrates, less than 200mg  of dietary cholesterol, 20-35 gm of total fiber daily;Understanding of distribution of calorie intake throughout the day with the consumption of 4-5 meals/snacks;Weight Gain: Understanding of general recommendations for a high calorie, high protein meal plan that promotes weight gain by distributing calorie intake throughout the day with the consumption for 4-5 meals, snacks, and/or supplements   Personal Goal Other Yes   Personal Goal Improve overall health, heart stronger. Be able to walk better.   Intervention Develop exercise program for home in conjunction with cardiac rehab to improve cardiorespiratory fitness. Encourage attendance at education classes for risk factor modification.   Expected Outcomes Understand modifiable risk factors for heart disease. Adhere to safe exercise parameters, continue walking program at home in addition to exercise at cardiac rehab.      Core Components/Risk Factors/Patient Goals Review:    Core Components/Risk Factors/Patient Goals at Discharge (Final Review):    ITP Comments:     ITP Comments      12/08/15 1406           ITP Comments Dr. Armanda Magic, Medical Director           Comments: Patient attended orientation from 1330 to 1530  to review rules and guidelines for program. Completed 6 minute walk test, Intitial ITP, and exercise prescription.  VSS. Telemetry-sinus rhythm .  Asymptomatic.

## 2015-12-09 NOTE — Telephone Encounter (Signed)
pc to [employer] to verify insurance benefits for cardiac rehab.  Per [employer] rep, deductible has been met.  Out of pocket expense has been met.  Cardiac rehab services 100% of allowed amount.  Ref:  [id number].  Pt informed this is estimate of coverage, understanding verbalized.

## 2015-12-14 ENCOUNTER — Encounter (HOSPITAL_COMMUNITY)
Admission: RE | Admit: 2015-12-14 | Discharge: 2015-12-14 | Disposition: A | Discharge status: Home / Self Care | Payer: BLUE CROSS/BLUE SHIELD | Source: Ambulatory Visit | Attending: Internal Medicine | Admitting: Internal Medicine

## 2015-12-14 DIAGNOSIS — I2510 Atherosclerotic heart disease of native coronary artery without angina pectoris: Secondary | ICD-10-CM | POA: Diagnosis not present

## 2015-12-14 DIAGNOSIS — Z79899 Other long term (current) drug therapy: Secondary | ICD-10-CM | POA: Diagnosis not present

## 2015-12-14 DIAGNOSIS — I214 Non-ST elevation (NSTEMI) myocardial infarction: Secondary | ICD-10-CM | POA: Insufficient documentation

## 2015-12-14 DIAGNOSIS — E785 Hyperlipidemia, unspecified: Secondary | ICD-10-CM | POA: Diagnosis not present

## 2015-12-14 DIAGNOSIS — I509 Heart failure, unspecified: Secondary | ICD-10-CM | POA: Diagnosis not present

## 2015-12-14 DIAGNOSIS — K219 Gastro-esophageal reflux disease without esophagitis: Secondary | ICD-10-CM | POA: Diagnosis not present

## 2015-12-14 DIAGNOSIS — Z955 Presence of coronary angioplasty implant and graft: Secondary | ICD-10-CM

## 2015-12-14 DIAGNOSIS — I110 Hypertensive heart disease with heart failure: Secondary | ICD-10-CM | POA: Insufficient documentation

## 2015-12-14 DIAGNOSIS — Z87891 Personal history of nicotine dependence: Secondary | ICD-10-CM | POA: Insufficient documentation

## 2015-12-14 DIAGNOSIS — Z7982 Long term (current) use of aspirin: Secondary | ICD-10-CM | POA: Insufficient documentation

## 2015-12-15 ENCOUNTER — Encounter (HOSPITAL_COMMUNITY): Payer: Self-pay

## 2015-12-15 NOTE — Progress Notes (Signed)
Daily Session Note  Patient Details  Name: Patrick Wilkins MRN: [id number] Date of Birth: July 17, 1955 Referring Provider:        CARDIAC REHAB PHASE II ORIENTATION from 12/08/2015 in [place]   Referring Provider  Hilty, [place] MD      Encounter Date: 12/14/2015  Check In:     Session Check In - 12/14/15 1440    Check-In   Location [facility]   Staff Present Luetta [place], MS, ACSM RCEP, Exercise Physiologist;Portia [place], RN, BSN;Zaahir Pickney, RN, Marga Melnick, RN, BSN   Supervising physician immediately available to respond to emergencies [facility] immediately available   Physician(s) Dr. Marily Memos    Medication changes reported     No   Fall or balance concerns reported    No   Warm-up and Cool-down Performed as group-led instruction   Resistance Training Performed No   VAD Patient? No   Pain Assessment   Currently in Pain? No/denies   Multiple Pain Sites No      Capillary Blood Glucose: No results found for this or any previous visit (from the past 24 hour(s)).   Goals Met:  Exercise tolerated well  Goals Unmet:  Not Applicable  Comments: Pt started cardiac rehab today.  Pt tolerated light exercise without difficulty. VSS, telemetry-sinus rhythm, asymptomatic.  Medication list reconciled. Pt denies barriers to medicaiton compliance.  PSYCHOSOCIAL ASSESSMENT:  PHQ-0. Pt exhibits positive coping skills, hopeful outlook with supportive family. Pt does exhibit significant type A behaviors associated with health related anxiety. Exercise and stress management education recommended. No psychosocial needs identified at this time, no psychosocial interventions necessary.    Pt enjoys hunting and fishing.   Pt oriented to exercise equipment and routine.    Understanding verbalized.    Dr. Fransico Him is Medical Director for Cardiac Rehab at [facility].

## 2015-12-16 ENCOUNTER — Encounter (HOSPITAL_COMMUNITY)
Admission: RE | Admit: 2015-12-16 | Discharge: 2015-12-16 | Disposition: A | Discharge status: Home / Self Care | Payer: BLUE CROSS/BLUE SHIELD | Source: Ambulatory Visit | Attending: Internal Medicine | Admitting: Internal Medicine

## 2015-12-16 DIAGNOSIS — I214 Non-ST elevation (NSTEMI) myocardial infarction: Secondary | ICD-10-CM

## 2015-12-16 DIAGNOSIS — Z955 Presence of coronary angioplasty implant and graft: Secondary | ICD-10-CM

## 2015-12-16 NOTE — Progress Notes (Signed)
QUALITY OF LIFE SCORE REVIEW  Pt completed Quality of Life survey as a participant in Cardiac Rehab. Scores 21.0 or below are considered low. Pt score very low in several areas Overall 24.18, Health and Function 23.66, socioeconomic 24.86, physiological and spiritual 25.71, family 22.80. Patient quality of life slightly altered by physical constraints which limits ability to perform as prior to recent cardiac illness.  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 Pt admits to feelings of helplessness as he recently learned of his family history for heart disease. Pt feels discouraged because he could have tried harder at preventive measures had he been aware of this fact. Pt offered encouragement in that he is now participating in cardiac rehab for secondary prevention. Pt attended stress management education class.   Offered emotional support and reassurance.  Will continue to monitor and intervene as necessary.

## 2015-12-18 ENCOUNTER — Encounter (HOSPITAL_COMMUNITY)
Admission: RE | Admit: 2015-12-18 | Discharge: 2015-12-18 | Disposition: A | Discharge status: Home / Self Care | Payer: BLUE CROSS/BLUE SHIELD | Source: Ambulatory Visit | Attending: Internal Medicine | Admitting: Internal Medicine

## 2015-12-18 DIAGNOSIS — I214 Non-ST elevation (NSTEMI) myocardial infarction: Secondary | ICD-10-CM

## 2015-12-18 DIAGNOSIS — Z955 Presence of coronary angioplasty implant and graft: Secondary | ICD-10-CM

## 2015-12-21 ENCOUNTER — Encounter (HOSPITAL_COMMUNITY)
Admission: RE | Admit: 2015-12-21 | Discharge: 2015-12-21 | Disposition: A | Discharge status: Home / Self Care | Payer: BLUE CROSS/BLUE SHIELD | Source: Ambulatory Visit | Attending: Internal Medicine | Admitting: Internal Medicine

## 2015-12-21 DIAGNOSIS — I214 Non-ST elevation (NSTEMI) myocardial infarction: Secondary | ICD-10-CM | POA: Diagnosis not present

## 2015-12-21 DIAGNOSIS — Z955 Presence of coronary angioplasty implant and graft: Secondary | ICD-10-CM

## 2015-12-21 NOTE — Progress Notes (Signed)
Reviewed home exercise with pt today.  Pt plans to walk for exercise, 3x/week in addition to coming to [facility].  Reviewed THR, pulse, RPE, sign and symptoms, NTG use, and when to call 911 or MD.  Also discussed weather considerations and indoor options.  Pt voiced understanding.   Analiz Tvedt Genuine PartsFair,MS,ACSM RCEP

## 2015-12-23 ENCOUNTER — Encounter (HOSPITAL_COMMUNITY)
Admission: RE | Admit: 2015-12-23 | Discharge: 2015-12-23 | Disposition: A | Discharge status: Home / Self Care | Payer: BLUE CROSS/BLUE SHIELD | Source: Ambulatory Visit | Attending: Internal Medicine | Admitting: Internal Medicine

## 2015-12-23 DIAGNOSIS — I214 Non-ST elevation (NSTEMI) myocardial infarction: Secondary | ICD-10-CM | POA: Diagnosis not present

## 2015-12-23 DIAGNOSIS — Z955 Presence of coronary angioplasty implant and graft: Secondary | ICD-10-CM

## 2015-12-23 NOTE — Progress Notes (Signed)
Patrick Wilkins 60 y.o. male Nutrition Note Spoke with pt. Nutrition Plan and Nutrition Survey goals reviewed with pt. Pt is following Step 2 of the Therapeutic Lifestyle Changes diet. Pt with dx of CHF. Per discussion, pt does not use canned/convenience foods often. Pt is watching his sodium closely. Pt states "my wife thinks I get 200-300 mg of sodium a day sometimes." Pt aware that he gets more sodium when he eats out. Pt eats out frequently. Ways to make healthier choices when eating out discussed. Pt expressed understanding of the information reviewed. Pt aware of nutrition education classes offered and [place] to attend nutrition classes [place]  No results found for: HGBA1C Wt Readings from Last 3 Encounters:  12/08/15 235 lb 14.3 oz (107 kg)  12/08/15 234 lb 6.4 oz (106.323 kg)  11/25/15 231 lb 3.2 oz (104.872 kg)   Nutrition Diagnosis ? Food-and nutrition-related knowledge deficit related to lack of exposure to information as related to diagnosis of: ? CVD ? Obesity related to excessive energy intake as evidenced by a BMI of 33  Nutrition RX/ Estimated Daily Nutrition Needs for: wt loss 1850-2350 Kcal, 50-65 gm fat, 12-16 gm sat fat, 1.8-2.4 gm trans-fat, <1500 mg sodium  Nutrition Intervention ? Pt's individual nutrition plan reviewed with pt. ? Benefits of adopting Therapeutic Lifestyle Changes discussed when Medficts reviewed. ? Pt to attend the Portion Distortion class ? Pt given handouts for: ? Nutrition I class ? Nutrition II class ? Continue client-centered nutrition education by RD, as part of interdisciplinary care. Goal(s) ? Pt to identify food quantities necessary to achieve weight loss of 6-24 lb (2.7-10.9 kg) at graduation from cardiac rehab.  Monitor and Evaluate progress toward nutrition goal with team. Mickle PlumbEdna Mat Wilkins, M.Ed, RD, LDN, CDE 12/23/2015 3:01 PM

## 2015-12-25 ENCOUNTER — Encounter (HOSPITAL_COMMUNITY)
Admission: RE | Admit: 2015-12-25 | Discharge: 2015-12-25 | Disposition: A | Discharge status: Home / Self Care | Payer: BLUE CROSS/BLUE SHIELD | Source: Ambulatory Visit | Attending: Internal Medicine | Admitting: Internal Medicine

## 2015-12-25 ENCOUNTER — Telehealth: Payer: Self-pay | Admitting: Internal Medicine

## 2015-12-25 DIAGNOSIS — Z955 Presence of coronary angioplasty implant and graft: Secondary | ICD-10-CM

## 2015-12-25 DIAGNOSIS — I214 Non-ST elevation (NSTEMI) myocardial infarction: Secondary | ICD-10-CM

## 2015-12-25 NOTE — Telephone Encounter (Signed)
Will bring fax when I receive.

## 2015-12-25 NOTE — Telephone Encounter (Signed)
Left msg requesting call back from patient.

## 2015-12-25 NOTE — Telephone Encounter (Signed)
Returned call. Joann RN at Cardiac Rehab saw patient today. He notes he had rectal bleeding which he saw PCP for, determined to be hemorrhoids. He was instructed not to make any changes to his Brilinta and has been compliant w/ these instructions.  He had some dizziness when helping daughter move furniture yesterday, which resolved on its own. He denies dizziness or symptoms today.  Chyrl CivatteJoann has observed a trend of low post-exercise BPs (systolic in 90s). She has encouraged pt to increase fluid intake, thinks he may be dehydrated & this might have contributed to yesterday's dizziness. She thinks the pt may also benefit from eating more adequate meals prior to rehab sessions - generally he has been very compliant w salt restriction. We discussed option of snacks (e.g peanut butter and crackers) post-exercise.  A fax is pending today w BPs and HRs. I did not have further recommendations at this time, suggested pt continue as planned w/ Joann's suggestions. Will route to Dr. Royann Shiversroitoru ([facility]) and furnish faxed report from [facility] for further suggestions.

## 2015-12-25 NOTE — Telephone Encounter (Signed)
New message     The pt reported to the cardiac rehab nurse jo-ann the pt had dizziness after moving heavy furniture yesterday  The pt also wanted rectal bleeding but was seen by the Md and treated  The cardiac rehab nurse took the pt's vital signs and stated they where fine sitting b/p 108/64 and standing 110/70.  The cardiac rehab nurse is ending over a fax with the report information on it as  Well.

## 2015-12-25 NOTE — Telephone Encounter (Signed)
Do not stop Brilinta, except for true life-threatening bleeding. Reduce lisinopril dose in half.

## 2015-12-25 NOTE — Progress Notes (Addendum)
Daily Session Note  Patient Details  Name: Patrick Wilkins MRN: [id number] Date of Birth: Mar 15, 1956 Referring Provider:        CARDIAC REHAB PHASE II ORIENTATION from 12/08/2015 in [place]   Referring Provider  Hilty, [place] MD      Encounter Date: 12/25/2015  Check In:     Session Check In - 12/25/15 1325    Check-In   Location [facility]   Staff Present Su Hilt, MS, ACSM RCEP, Exercise Physiologist;Olinty [place], MS, ACSM CEP, Exercise Physiologist;Keylie Beavers, RN, BSN   Supervising physician immediately available to respond to emergencies [facility] immediately available   Physician(s) Dr. Marily Memos    Medication changes reported     No   Fall or balance concerns reported    No   Warm-up and Cool-down Performed as group-led instruction   Resistance Training Performed Yes   VAD Patient? No   Pain Assessment   Currently in Pain? No/denies   Multiple Pain Sites No      Capillary Blood Glucose: No results found for this or any previous visit (from the past 24 hour(s)).   Goals Met:  Exercise tolerated well  Goals Unmet:  Not Applicable  Comments: pt arrived at cardiac rehab reporting episode of dizziness that occurred at home yesterday while he was helping move heavy furniture. Pt states symptoms were relieved with rest. Pt also reports he has had rectal bleeding this week that has been evaluated by his urologist PA. Pt unsure of cause of bleeding however assumes it is hemorrhoids.  Pt able to tolerate exercise without difficulty. PC to Dr. Lysbeth Penner office to advise. Rehab report faxed for Dr. Debara Pickett to review.  LM for Dr. Debara Pickett office to return call.  Orthostatic Vital signs:  108/64 Hr- 80 sitting 110/70 HR-88 standing.   Dr. Fransico Him is Medical Director for Cardiac Rehab at [facility].

## 2015-12-28 ENCOUNTER — Encounter (HOSPITAL_COMMUNITY)
Admission: RE | Admit: 2015-12-28 | Discharge: 2015-12-28 | Disposition: A | Discharge status: Home / Self Care | Payer: BLUE CROSS/BLUE SHIELD | Source: Ambulatory Visit | Attending: Internal Medicine | Admitting: Internal Medicine

## 2015-12-28 DIAGNOSIS — I214 Non-ST elevation (NSTEMI) myocardial infarction: Secondary | ICD-10-CM

## 2015-12-28 DIAGNOSIS — Z955 Presence of coronary angioplasty implant and graft: Secondary | ICD-10-CM

## 2015-12-28 NOTE — Telephone Encounter (Signed)
Left message for pt to call.

## 2015-12-30 ENCOUNTER — Encounter (HOSPITAL_COMMUNITY)
Admission: RE | Admit: 2015-12-30 | Discharge: 2015-12-30 | Disposition: A | Discharge status: Home / Self Care | Payer: BLUE CROSS/BLUE SHIELD | Source: Ambulatory Visit | Attending: Internal Medicine | Admitting: Internal Medicine

## 2015-12-30 DIAGNOSIS — I214 Non-ST elevation (NSTEMI) myocardial infarction: Secondary | ICD-10-CM | POA: Diagnosis not present

## 2016-01-01 ENCOUNTER — Encounter (HOSPITAL_COMMUNITY): Admission: RE | Admit: 2016-01-01 | Payer: BLUE CROSS/BLUE SHIELD | Source: Ambulatory Visit

## 2016-01-01 NOTE — Progress Notes (Signed)
Cardiac Individual Treatment Plan  Patient Details  Name: Patrick Wilkins MRN: [id number] Date of Birth: 11/07/1955 Referring Provider:        CARDIAC REHAB PHASE II ORIENTATION from 12/08/2015 in [facility]   Referring Provider  Hilty, Chad MD      Initial Encounter Date:       CARDIAC REHAB PHASE II ORIENTATION from 12/08/2015 in [facility]   Date  12/08/15   Referring Provider  Hilty, Chad MD      Visit Diagnosis: Stented coronary artery  NSTEMI (non-ST elevated myocardial infarction) (HCC)  Patient's Home Medications on Admission:  Current outpatient prescriptions:  .  allopurinol (ZYLOPRIM) 100 MG tablet, Take 100 mg by mouth daily., Disp: , Rfl:  .  aspirin 81 MG tablet, Take 81 mg by mouth daily as needed for pain. For chest pain. Given by EMS, Disp: , Rfl:  .  atorvastatin (LIPITOR) 80 MG tablet, Take 1 tablet (80 mg total) by mouth daily at 6 PM., Disp: 30 tablet, Rfl: 6 .  carvedilol (COREG) 6.25 MG tablet, Take 1 tablet (6.25 mg total) by mouth 2 (two) times daily with a meal., Disp: 60 tablet, Rfl: 11 .  clonazePAM (KLONOPIN) 0.5 MG tablet, Take 0.5 mg by mouth 2 (two) times daily as needed for anxiety., Disp: , Rfl:  .  lisinopril (PRINIVIL,ZESTRIL) 10 MG tablet, Take 1 tablet (10 mg total) by mouth daily., Disp: 30 tablet, Rfl: 11 .  nitroGLYCERIN (NITROSTAT) 0.4 MG SL tablet, Place 1 tablet (0.4 mg total) under the tongue every 5 (five) minutes as needed for chest pain. Given by EMS, Disp: 25 tablet, Rfl: 12 .  omeprazole (PRILOSEC) 20 MG capsule, Take 40 mg by mouth 2 (two) times daily before a meal. , Disp: , Rfl:  .  psyllium (METAMUCIL) 58.6 % packet, Take 1 packet by mouth at bedtime., Disp: , Rfl:  .  sulfamethoxazole-trimethoprim (BACTRIM DS,SEPTRA DS) 800-160 MG tablet, Take 1 tablet by mouth 2 (two) times daily. Takes PRN for a few days when he feels a "boil" coming on - ok with cardiologist, Disp:  , Rfl:  .  ticagrelor (BRILINTA) 90 MG TABS tablet, Take 1 tablet (90 mg total) by mouth 2 (two) times daily., Disp: 60 tablet, Rfl: 0 .  traMADol (ULTRAM) 50 MG tablet, Take 50 mg by mouth every 6 (six) hours as needed., Disp: , Rfl: 0 .  vitamin C (ASCORBIC ACID) 500 MG tablet, Take 500 mg by mouth daily., Disp: , Rfl:   Past Medical History: Past Medical History  Diagnosis Date  . Hypertension   . Anxiety   . CAD (coronary artery disease)     a. NSTEMI 10/30/15 s/p PTCA and DES of total occusion of  mid LAD. ostial and proximal LAD 50% stenosis, 50-70% PDA stenosis, and 70% stenosis of the first obtuse marginal.   . Hyperlipidemia   . NSTEMI (non-ST elevated myocardial infarction) (HCC) 10/2015  . Kidney stones   . Tuberculosis 1958  . Complication of anesthesia 1958    "died on the table when they did OR to dx TB"  . CHF (congestive heart failure) (HCC) 10/2015  . Childhood asthma   . GERD (gastroesophageal reflux disease)   . Arthritis     "right knee, back" (11/24/2015)    Tobacco Use: History  Smoking status  . Former Smoker -- 1.00 packs/day for 5 years  . Types: Cigarettes  Smokeless tobacco  .   Former User  . Types: Chew    Comment: "quit smoking cigarettes & chewing tobacco in my 20s"    Labs: Recent Review Flowsheet Data    Labs for ITP Cardiac and Pulmonary Rehab Latest Ref Rng 11/01/2015   Cholestrol 0 - 200 mg/dL 171   LDLCALC 0 - 99 mg/dL 100(H)   HDL >40 mg/dL 36(L)   Trlycerides <150 mg/dL 173(H)      Capillary Blood Glucose: No results found for: GLUCAP   Exercise Target Goals:    Exercise Program Goal: Individual exercise prescription set with THRR, safety & activity barriers. Participant demonstrates ability to understand and report RPE using BORG scale, to self-measure pulse accurately, and to acknowledge the importance of the exercise prescription.  Exercise Prescription Goal: Starting with aerobic activity 30 plus minutes a day, 3 days per  week for initial exercise prescription. Provide home exercise prescription and guidelines that participant acknowledges understanding prior to discharge.  Activity Barriers & Risk Stratification:     Activity Barriers & Cardiac Risk Stratification - 12/08/15 1651    Activity Barriers & Cardiac Risk Stratification   Activity Barriers Back Problems;Other (comment)   Comments Hx torn cartilage in right knee   Cardiac Risk Stratification High      6 Minute Walk:     6 Minute Walk      12/08/15 1700       6 Minute Walk   Phase Initial     Distance 1549 feet     Walk Time 6 minutes     # of Rest Breaks 0     MPH 2.93     METS 3.49     RPE 11     VO2 Peak 12.22     Symptoms No     Resting HR 68 bpm     Resting BP 124/80 mmHg     Max Ex. HR 90 bpm     Max Ex. BP 122/72 mmHg     2 Minute Post BP 100/66 mmHg        Initial Exercise Prescription:     Initial Exercise Prescription - 12/08/15 1700    Date of Initial Exercise RX and Referring Provider   Date 12/08/15   Referring Provider Hilty, Chad MD   Bike   Level 1.4   Minutes 10   METs 3.46   NuStep   Level 3   Minutes 10   METs 2.8   Track   Laps 11   Minutes 10   METs 2.92   Prescription Details   Frequency (times per week) 3   Duration Progress to 30 minutes of continuous aerobic without signs/symptoms of physical distress   Intensity   THRR 40-80% of Max Heartrate 64-129   Ratings of Perceived Exertion 11-13   Perceived Dyspnea 0-4   Progression   Progression Continue to progress workloads to maintain intensity without signs/symptoms of physical distress.   Resistance Training   Training Prescription Yes   Weight 2-3lbs.   Reps 10-12      Perform Capillary Blood Glucose checks as needed.  Exercise Prescription Changes:     Exercise Prescription Changes      12/23/15 1700           Exercise Review   Progression Yes       Response to Exercise   Blood Pressure (Admit) 122/72 mmHg        Blood Pressure (Exercise) 142/72 mmHg       Blood Pressure (Exit)   104/64 mmHg       Heart Rate (Admit) 82 bpm       Heart Rate (Exercise) 108 bpm       Heart Rate (Exit) 82 bpm       Rating of Perceived Exertion (Exercise) 11       Comments reviewed HEP 12/21/15       Progression   Average METs 3.6       Resistance Training   Training Prescription Yes       Weight 3lbs       Reps 10-12       Bike   Level 1.4       Minutes 10       METs 3.46       NuStep   Level 3       Minutes 10       METs 2.8       Track   Laps 12       Minutes 10       METs 3.09       Home Exercise Plan   Plans to continue exercise at Home  plans to walk 3x/week        Frequency Add 3 additional days to program exercise sessions.          Exercise Comments:     Exercise Comments      12/23/15 1736           Exercise Comments Reviewed METs and goals. Pt is tolerating exercise well; will continue to monitor exercise progression          Discharge Exercise Prescription (Final Exercise Prescription Changes):     Exercise Prescription Changes - 12/23/15 1700    Exercise Review   Progression Yes   Response to Exercise   Blood Pressure (Admit) 122/72 mmHg   Blood Pressure (Exercise) 142/72 mmHg   Blood Pressure (Exit) 104/64 mmHg   Heart Rate (Admit) 82 bpm   Heart Rate (Exercise) 108 bpm   Heart Rate (Exit) 82 bpm   Rating of Perceived Exertion (Exercise) 11   Comments reviewed HEP 12/21/15   Progression   Average METs 3.6   Resistance Training   Training Prescription Yes   Weight 3lbs   Reps 10-12   Bike   Level 1.4   Minutes 10   METs 3.46   NuStep   Level 3   Minutes 10   METs 2.8   Track   Laps 12   Minutes 10   METs 3.09   Home Exercise Plan   Plans to continue exercise at Home  plans to walk 3x/week    Frequency Add 3 additional days to program exercise sessions.      Nutrition:  Target Goals: Understanding of nutrition guidelines, daily intake of sodium <1500mg,  cholesterol <200mg, calories 30% from fat and 7% or less from saturated fats, daily to have 5 or more servings of fruits and vegetables.  Biometrics:     Pre Biometrics - 12/08/15 1653    Pre Biometrics   Waist Circumference 47.75 inches   Hip Circumference 46.75 inches   Waist to Hip Ratio 1.02 %   Triceps Skinfold 18.5 mm   % Body Fat 33.4 %   Grip Strength 43.5 kg   Flexibility 0 in   Single Leg Stand 30 seconds       Nutrition Therapy Plan and Nutrition Goals:     Nutrition Therapy & Goals - 12/11/15 1010      Nutrition Therapy   Diet Therapeutic Lifestyle Changes   Personal Nutrition Goals   Personal Goal #1 0.5-2 lb wt loss per week to a goal wt loss of 6-24 lb at graduation from Cardiac Rehab.   Intervention Plan   Intervention Prescribe, educate and counsel regarding individualized specific dietary modifications aiming towards targeted core components such as weight, hypertension, lipid management, diabetes, heart failure and other comorbidities.   Expected Outcomes Short Term Goal: Understand basic principles of dietary content, such as calories, fat, sodium, cholesterol and nutrients.;Long Term Goal: Adherence to prescribed nutrition plan.      Nutrition Discharge: Nutrition Scores:     Nutrition Assessments - 12/23/15 1500    MEDFICTS Scores   Pre Score 6      Nutrition Goals Re-Evaluation:   Psychosocial: Target Goals: Acknowledge presence or absence of depression, maximize coping skills, provide positive support system. Participant is able to verbalize types and ability to use techniques and skills needed for reducing stress and depression.  Initial Review & Psychosocial Screening:     Initial Psych Review & Screening - 12/15/15 1217    Initial Review   Current issues with Current Anxiety/Panic   Family Dynamics   Good Support System? Yes   Barriers   Psychosocial barriers to participate in program The patient should benefit from training in stress  management and relaxation.   Screening Interventions   Interventions Encouraged to exercise      Quality of Life Scores:     Quality of Life - 12/08/15 1659    Quality of Life Scores   Health/Function Pre 24.57 %   Socioeconomic Pre 27.5 %   Psych/Spiritual Pre 28.21 %   Family Pre 27.6 %   GLOBAL Pre 26.33 %      PHQ-9:     Recent Review Flowsheet Data    Depression screen PHQ 2/9 12/15/2015   Decreased Interest 0   Down, Depressed, Hopeless 0   PHQ - 2 Score 0      Psychosocial Evaluation and Intervention:     Psychosocial Evaluation - 01/01/16 1356    Psychosocial Evaluation & Interventions   Interventions Stress management education;Relaxation education;Encouraged to exercise with the program and follow exercise prescription   Continued Psychosocial Services Needed Yes      Psychosocial Re-Evaluation:   Vocational Rehabilitation: Provide vocational rehab assistance to qualifying candidates.   Vocational Rehab Evaluation & Intervention:     Vocational Rehab - 12/09/15 1008    Initial Vocational Rehab Evaluation & Intervention   Assessment shows need for Vocational Rehabilitation No      Education: Education Goals: Education classes will be provided on a weekly basis, covering required topics. Participant will state understanding/return demonstration of topics presented.  Learning Barriers/Preferences:     Learning Barriers/Preferences - 12/08/15 1651    Learning Barriers/Preferences   Learning Barriers None   Learning Preferences Written Material;Verbal Instruction;Skilled Demonstration;Video;Group Instruction;Individual Instruction      Education Topics: Count Your Pulse:  -Group instruction provided by verbal instruction, demonstration, patient participation and written materials to support subject.  Instructors address importance of being able to find your pulse and how to count your pulse when at home without a heart monitor.  Patients get  hands on experience counting their pulse with staff help and individually.   Heart Attack, Angina, and Risk Factor Modification:  -Group instruction provided by verbal instruction, video, and written materials to support subject.  Instructors address signs and symptoms of angina and heart attacks.      Also discuss risk factors for heart disease and how to make changes to improve heart health risk factors.   Functional Fitness:  -Group instruction provided by verbal instruction, demonstration, patient participation, and written materials to support subject.  Instructors address safety measures for doing things around the house.  Discuss how to get up and down off the floor, how to pick things up properly, how to safely get out of a chair without assistance, and balance training.   Meditation and Mindfulness:  -Group instruction provided by verbal instruction, patient participation, and written materials to support subject.  Instructor addresses importance of mindfulness and meditation practice to help reduce stress and improve awareness.  Instructor also leads participants through a meditation exercise.    Stretching for Flexibility and Mobility:  -Group instruction provided by verbal instruction, patient participation, and written materials to support subject.  Instructors lead participants through series of stretches that are designed to increase flexibility thus improving mobility.  These stretches are additional exercise for major muscle groups that are typically performed during regular warm up and cool down.   Hands Only CPR Anytime:  -Group instruction provided by verbal instruction, video, patient participation and written materials to support subject.  Instructors co-teach with AHA video for hands only CPR.  Participants get hands on experience with mannequins.   Nutrition I class: Heart Healthy Eating:  -Group instruction provided by PowerPoint slides, verbal discussion, and written  materials to support subject matter. The instructor gives an explanation and review of the Therapeutic Lifestyle Changes diet recommendations, which includes a discussion on lipid goals, dietary fat, sodium, fiber, plant stanol/sterol esters, sugar, and the components of a well-balanced, healthy diet.   Nutrition II class: Lifestyle Skills:  -Group instruction provided by PowerPoint slides, verbal discussion, and written materials to support subject matter. The instructor gives an explanation and review of label reading, grocery shopping for heart health, heart healthy recipe modifications, and ways to make healthier choices when eating out.   Diabetes Question & Answer:  -Group instruction provided by PowerPoint slides, verbal discussion, and written materials to support subject matter. The instructor gives an explanation and review of diabetes co-morbidities, pre- and post-prandial blood glucose goals, pre-exercise blood glucose goals, signs, symptoms, and treatment of hypoglycemia and hyperglycemia, and foot care basics.   Diabetes Blitz:  -Group instruction provided by PowerPoint slides, verbal discussion, and written materials to support subject matter. The instructor gives an explanation and review of the physiology behind type 1 and type 2 diabetes, diabetes medications and rational behind using different medications, pre- and post-prandial blood glucose recommendations and Hemoglobin A1c goals, diabetes diet, and exercise including blood glucose guidelines for exercising safely.    Portion Distortion:  -Group instruction provided by PowerPoint slides, verbal discussion, written materials, and food models to support subject matter. The instructor gives an explanation of serving size versus portion size, changes in portions sizes over the last 20 years, and what consists of a serving from each food group.   Stress Management:  -Group instruction provided by verbal instruction, video, and  written materials to support subject matter.  Instructors review role of stress in heart disease and how to cope with stress positively.            CARDIAC REHAB PHASE II EXERCISE from 12/16/2015 in [facility]   Date  12/16/15   Instruction Review Code  2- meets goals/outcomes      Exercising on Your Own:  -Group instruction provided by   verbal instruction, power point, and written materials to support subject.  Instructors discuss benefits of exercise, components of exercise, frequency and intensity of exercise, and end points for exercise.  Also discuss use of nitroglycerin and activating EMS.  Review options of places to exercise outside of rehab.  Review guidelines for sex with heart disease.   Cardiac Drugs I:  -Group instruction provided by verbal instruction and written materials to support subject.  Instructor reviews cardiac drug classes: antiplatelets, anticoagulants, beta blockers, and statins.  Instructor discusses reasons, side effects, and lifestyle considerations for each drug class.   Cardiac Drugs II:  -Group instruction provided by verbal instruction and written materials to support subject.  Instructor reviews cardiac drug classes: angiotensin converting enzyme inhibitors (ACE-I), angiotensin II receptor blockers (ARBs), nitrates, and calcium channel blockers.  Instructor discusses reasons, side effects, and lifestyle considerations for each drug class.   Anatomy and Physiology of the Circulatory System:  -Group instruction provided by verbal instruction, video, and written materials to support subject.  Reviews functional anatomy of heart, how it relates to various diagnoses, and what role the heart plays in the overall system.   Knowledge Questionnaire Score:     Knowledge Questionnaire Score - 12/08/15 1652    Knowledge Questionnaire Score   Pre Score 18/24      Core Components/Risk Factors/Patient Goals at Admission:      Personal Goals and Risk Factors at Admission - 12/08/15 1654    Core Components/Risk Factors/Patient Goals on Admission    Weight Management Yes;Weight Loss   Intervention Weight Management: Develop a combined nutrition and exercise program designed to reach desired caloric intake, while maintaining appropriate intake of nutrient and fiber, sodium and fats, and appropriate energy expenditure required for the weight goal.;Weight Management: Provide education and appropriate resources to help participant work on and attain dietary goals.;Weight Management/Obesity: Establish reasonable short term and long term weight goals.;Obesity: Provide education and appropriate resources to help participant work on and attain dietary goals.   Admit Weight 235 lb 14.3 oz (107 kg)   Goal Weight: Short Term 229 lb (103.874 kg)   Goal Weight: Long Term 200 lb (90.719 kg)   Expected Outcomes Short Term: Continue to assess and modify interventions until short term weight is achieved;Long Term: Adherence to nutrition and physical activity/exercise program aimed toward attainment of established weight goal;Weight Maintenance: Understanding of the daily nutrition guidelines, which includes 25-35% calories from fat, 7% or less cal from saturated fats, less than 200mg cholesterol, less than 1.5gm of sodium, & 5 or more servings of fruits and vegetables daily;Weight Loss: Understanding of general recommendations for a balanced deficit meal plan, which promotes 1-2 lb weight loss per week and includes a negative energy balance of 500-1000 kcal/d;Understanding recommendations for meals to include 15-35% energy as protein, 25-35% energy from fat, 35-60% energy from carbohydrates, less than 200mg of dietary cholesterol, 20-35 gm of total fiber daily;Understanding of distribution of calorie intake throughout the day with the consumption of 4-5 meals/snacks;Weight Gain: Understanding of general recommendations for a high calorie, high  protein meal plan that promotes weight gain by distributing calorie intake throughout the day with the consumption for 4-5 meals, snacks, and/or supplements   Personal Goal Other Yes   Personal Goal Improve overall health, heart stronger. Be able to walk better.   Intervention Develop exercise program for home in conjunction with cardiac rehab to improve cardiorespiratory fitness. Encourage attendance at education classes for risk factor modification.   Expected Outcomes Understand modifiable   risk factors for heart disease. Adhere to safe exercise parameters, continue walking program at home in addition to exercise at cardiac rehab.      Core Components/Risk Factors/Patient Goals Review:      Goals and Risk Factor Review      12/24/15 1648           Core Components/Risk Factors/Patient Goals Review   Personal Goals Review Other       Review pt has started walking at home for exercise 2x/week       Expected Outcomes pt will continue with HEP with less fatigue and SOB          Core Components/Risk Factors/Patient Goals at Discharge (Final Review):      Goals and Risk Factor Review - 12/24/15 1648    Core Components/Risk Factors/Patient Goals Review   Personal Goals Review Other   Review pt has started walking at home for exercise 2x/week   Expected Outcomes pt will continue with HEP with less fatigue and SOB      ITP Comments:     ITP Comments      12/08/15 1406           ITP Comments Dr. Traci Turner, Medical Director           Comments: Pt is making expected progress toward personal goals after completing 9 sessions. Recommend continued exercise and life style modification education including  stress management and relaxation techniques to decrease cardiac risk profile.  

## 2016-01-01 NOTE — Progress Notes (Signed)
Cardiac Individual Treatment Plan  Patient Details  Name: Patrick Wilkins MRN: [id number] Date of Birth: Jul 01, 1956 Referring Provider:        [facility]ARDI[facility]   Referring Provider  Hilty, [place] MD      Initial Encounter Date:       CARDIAC REHAB PHASE II [facility]ATION [facility]   Date  12/08/15   Referring Provider  Hilty, [place] MD      Visit Diagnosis: Stented coronary artery  NSTEMI (non-ST elevated myocardial infarction) (HCC)  Patient's Home Medications on Admission:  Current outpatient prescriptions:  .  allopurinol (ZYLOPRIM) 100 MG tablet, Take 100 mg by mouth daily., Disp: , Rfl:  .  aspirin 81 MG tablet, Take 81 mg by mouth daily as needed for pain. For chest pain. Given by EMS, Disp: , Rfl:  .  atorvastatin (LIPITOR) 80 MG tablet, Take 1 tablet (80 mg total) by mouth daily at 6 PM., Disp: 30 tablet, Rfl: 6 .  carvedilol (COREG) 6.25 MG tablet, Take 1 tablet (6.25 mg total) by mouth 2 (two) times daily with a meal., Disp: 60 tablet, Rfl: 11 .  clonazePAM (KLONOPIN) 0.5 MG tablet, Take 0.5 mg by mouth 2 (two) times daily as needed for anxiety., Disp: , Rfl:  .  lisinopril (PRINIVIL,ZESTRIL) 10 MG tablet, Take 1 tablet (10 mg total) by mouth daily., Disp: 30 tablet, Rfl: 11 .  nitroGLYCERIN (NITROSTAT) 0.4 MG SL tablet, Place 1 tablet (0.4 mg total) under the tongue every 5 (five) minutes as needed for chest pain. Given by EMS, Disp: 25 tablet, Rfl: 12 .  omeprazole (PRILOSEC) 20 MG capsule, Take 40 mg by mouth 2 (two) times daily before a meal. , Disp: , Rfl:  .  psyllium (METAMUCIL) 58.6 % packet, Take 1 packet by mouth at bedtime., Disp: , Rfl:  .  sulfamethoxazole-trimethoprim (BACTRIM DS,SEPTRA DS) 800-160 MG tablet, Take 1 tablet by mouth 2 (two) times daily. Takes PRN for a few days when he feels a "boil" coming on - ok with cardiologist, Disp:  , Rfl:  .  ticagrelor (BRILINTA) 90 MG TABS tablet, Take 1 tablet (90 mg total) by mouth 2 (two) times daily., Disp: 60 tablet, Rfl: 0 .  traMADol (ULTRAM) 50 MG tablet, Take 50 mg by mouth every 6 (six) hours as needed., Disp: , Rfl: 0 .  vitamin C (ASCORBIC ACID) 500 MG tablet, Take 500 mg by mouth daily., Disp: , Rfl:   Past Medical History: Past Medical History  Diagnosis Date  . Hypertension   . Anxiety   . CAD (coronary artery disease)     a. NSTEMI 10/30/15 s/p PTCA and DES of total occusion of  mid LAD. ostial and proximal LAD 50% stenosis, 50-70% PDA stenosis, and 70% stenosis of the first obtuse marginal.   . Hyperlipidemia   . NSTEMI (non-ST elevated myocardial infarction) (HCC) 10/2015  . Kidney stones   . Tuberculosis 1958  . Complication of anesthesia 1958    "died on the table when they did OR to dx TB"  . CHF (congestive heart failure) (HCC) 10/2015  . Childhood asthma   . GERD (gastroesophageal reflux disease)   . Arthritis     "right knee, back" (11/24/2015)    Tobacco Use: History  Smoking status  . Former Smoker -- 1.00 packs/day for 5 years  . Types: Cigarettes  Smokeless tobacco  .  Former Neurosurgeon  . Types: Chew    Comment: "quit smoking cigarettes & chewing tobacco in my 20s"    Labs: Recent Review Flowsheet Data    Labs for ITP Cardiac and Pulmonary Rehab Latest Ref Rng 11/01/2015   Cholestrol 0 - 200 mg/dL 528   LDLCALC 0 - 99 mg/dL 413(K)   HDL >44 mg/dL 01(U)   Trlycerides <272 mg/dL 536(U)      Capillary Blood Glucose: No results found for: GLUCAP   Exercise Target Goals:    Exercise Program Goal: Individual exercise prescription set with THRR, safety & activity barriers. Participant demonstrates ability to understand and report RPE using BORG scale, to self-measure pulse accurately, and to acknowledge the importance of the exercise prescription.  Exercise Prescription Goal: Starting with aerobic activity 30 plus minutes a day, 3 days per  week for initial exercise prescription. Provide home exercise prescription and guidelines that participant acknowledges understanding prior to discharge.  Activity Barriers & Risk Stratification:     Activity Barriers & Cardiac Risk Stratification - 12/08/15 1651    Activity Barriers & Cardiac Risk Stratification   Activity Barriers Back Problems;Other (comment)   Comments Hx torn cartilage in right knee   Cardiac Risk Stratification High      6 Minute Walk:     6 Minute Walk      12/08/15 1700       6 Minute Walk   Phase Initial     Distance 1549 feet     Walk Time 6 minutes     # of Rest Breaks 0     MPH 2.93     METS 3.49     RPE 11     VO2 Peak 12.22     Symptoms No     Resting HR 68 bpm     Resting BP 124/80 mmHg     Max Ex. HR 90 bpm     Max Ex. BP 122/72 mmHg     2 Minute Post BP 100/66 mmHg        Initial Exercise Prescription:     Initial Exercise Prescription - 12/08/15 1700    Date of Initial Exercise RX and Referring Provider   Date 12/08/15   Referring Provider Hilty, [place] MD   Bike   Level 1.4   Minutes 10   METs 3.46   NuStep   Level 3   Minutes 10   METs 2.8   Track   Laps 11   Minutes 10   METs 2.92   Prescription Details   Frequency (times per week) 3   Duration Progress to 30 minutes of continuous aerobic without signs/symptoms of physical distress   Intensity   THRR 40-80% of Max Heartrate 64-129   Ratings of Perceived Exertion 11-13   Perceived Dyspnea 0-4   Progression   Progression Continue to progress workloads to maintain intensity without signs/symptoms of physical distress.   Resistance Training   Training Prescription Yes   Weight 2-3lbs.   Reps 10-12      Perform Capillary Blood Glucose checks as needed.  Exercise Prescription Changes:     Exercise Prescription Changes      12/23/15 1700           Exercise Review   Progression Yes       Response to Exercise   Blood Pressure (Admit) 122/72 mmHg        Blood Pressure (Exercise) 142/72 mmHg       Blood Pressure (Exit)  104/64 mmHg       Heart Rate (Admit) 82 bpm       Heart Rate (Exercise) 108 bpm       Heart Rate (Exit) 82 bpm       Rating of Perceived Exertion (Exercise) 11       Comments reviewed HEP 12/21/15       Progression   Average METs 3.6       Resistance Training   Training Prescription Yes       Weight 3lbs       Reps 10-12       Bike   Level 1.4       Minutes 10       METs 3.46       NuStep   Level 3       Minutes 10       METs 2.8       Track   Laps 12       Minutes 10       METs 3.09       Home Exercise Plan   Plans to continue exercise at Home  plans to walk 3x/week        Frequency Add 3 additional days to program exercise sessions.          Exercise Comments:     Exercise Comments      12/23/15 1736           Exercise Comments Reviewed METs and goals. Pt is tolerating exercise well; will continue to monitor exercise progression          Discharge Exercise Prescription (Final Exercise Prescription Changes):     Exercise Prescription Changes - 12/23/15 1700    Exercise Review   Progression Yes   Response to Exercise   Blood Pressure (Admit) 122/72 mmHg   Blood Pressure (Exercise) 142/72 mmHg   Blood Pressure (Exit) 104/64 mmHg   Heart Rate (Admit) 82 bpm   Heart Rate (Exercise) 108 bpm   Heart Rate (Exit) 82 bpm   Rating of Perceived Exertion (Exercise) 11   Comments reviewed HEP 12/21/15   Progression   Average METs 3.6   Resistance Training   Training Prescription Yes   Weight 3lbs   Reps 10-12   Bike   Level 1.4   Minutes 10   METs 3.46   NuStep   Level 3   Minutes 10   METs 2.8   Track   Laps 12   Minutes 10   METs 3.09   Home Exercise Plan   Plans to continue exercise at Home  plans to walk 3x/week    Frequency Add 3 additional days to program exercise sessions.      Nutrition:  Target Goals: Understanding of nutrition guidelines, daily intake of sodium 1500mg ,  cholesterol 200mg , calories 30% from fat and 7% or less from saturated fats, daily to have 5 or more servings of fruits and vegetables.  Biometrics:     Pre Biometrics - 12/08/15 1653    Pre Biometrics   Waist Circumference 47.75 inches   Hip Circumference 46.75 inches   Waist to Hip Ratio 1.02 %   Triceps Skinfold 18.5 mm   % Body Fat 33.4 %   Grip Strength 43.5 kg   Flexibility 0 in   Single Leg Stand 30 seconds       Nutrition Therapy Plan and Nutrition Goals:     Nutrition Therapy & Goals - 12/11/15 1010  Nutrition Therapy   Diet Therapeutic Lifestyle Changes   Personal Nutrition Goals   Personal Goal #1 0.5-2 lb wt loss per week to a goal wt loss of 6-24 lb at graduation from Cardiac Rehab.   Intervention Plan   Intervention Prescribe, educate and counsel regarding individualized specific dietary modifications aiming towards targeted core components such as weight, hypertension, lipid management, diabetes, heart failure and other comorbidities.   Expected Outcomes Short Term Goal: Understand basic principles of dietary content, such as calories, fat, sodium, cholesterol and nutrients.;Long Term Goal: Adherence to prescribed nutrition plan.      Nutrition Discharge: Nutrition Scores:     Nutrition Assessments - 12/23/15 1500    MEDFICTS Scores   Pre Score 6      Nutrition Goals Re-Evaluation:   Psychosocial: Target Goals: Acknowledge presence or absence of depression, maximize coping skills, provide positive support system. Participant is able to verbalize types and ability to use techniques and skills needed for reducing stress and depression.  Initial Review & Psychosocial Screening:     Initial Psych Review & Screening - 12/15/15 1217    Initial Review   Current issues with Current Anxiety/Panic   Family Dynamics   Good Support System? Yes   Barriers   Psychosocial barriers to participate in program The patient should benefit from training in stress  management and relaxation.   Screening Interventions   Interventions Encouraged to exercise      Quality of Life Scores:     Quality of Life - 12/08/15 1659    Quality of Life Scores   Health/Function Pre 24.57 %   Socioeconomic Pre 27.5 %   Psych/Spiritual Pre 28.21 %   Family Pre 27.6 %   GLOBAL Pre 26.33 %      PHQ-9:     Recent Review Flowsheet Data    Depression screen [facility] 2/9 12/15/2015   Decreased Interest 0   Down, Depressed, Hopeless 0   PHQ - 2 Score 0      Psychosocial Evaluation and Intervention:     Psychosocial Evaluation - 01/01/16 1356    Psychosocial Evaluation & Interventions   Interventions Stress management education;Relaxation education;Encouraged to exercise with the program and follow exercise prescription   Continued Psychosocial Services Needed Yes      Psychosocial Re-Evaluation:   Vocational Rehabilitation: Provide vocational rehab assistance to qualifying candidates.   Vocational Rehab Evaluation & Intervention:     Vocational Rehab - 12/09/15 1008    Initial Vocational Rehab Evaluation & Intervention   Assessment shows need for Vocational Rehabilitation No      Education: Education Goals: Education classes will be provided on a weekly basis, covering required topics. Participant will state understanding/return demonstration of topics presented.  Learning Barriers/Preferences:     Learning Barriers/Preferences - 12/08/15 1651    Learning Barriers/Preferences   Learning Barriers None   Learning Preferences Written Material;Verbal Instruction;Skilled Demonstration;Video;Group Instruction;Individual Instruction      Education Topics: Count Your Pulse:  -Group instruction provided by verbal instruction, demonstration, patient participation and written materials to support subject.  Instructors address importance of being able to find your pulse and how to count your pulse when at home without a heart monitor.  Patients get  hands on experience counting their pulse with staff help and individually.   Heart Attack, Angina, and Risk Factor Modification:  -Group instruction provided by verbal instruction, video, and written materials to support subject.  Instructors address signs and symptoms of angina and heart attacks.  Also discuss risk factors for heart disease and how to make changes to improve heart health risk factors.   Functional Fitness:  -Group instruction provided by verbal instruction, demonstration, patient participation, and written materials to support subject.  Instructors address safety measures for doing things around the house.  Discuss how to get up and down off the floor, how to pick things up properly, how to safely get out of a chair without assistance, and balance training.   Meditation and Mindfulness:  -Group instruction provided by verbal instruction, patient participation, and written materials to support subject.  Instructor addresses importance of mindfulness and meditation practice to help reduce stress and improve awareness.  Instructor also leads participants through a meditation exercise.    Stretching for Flexibility and Mobility:  -Group instruction provided by verbal instruction, patient participation, and written materials to support subject.  Instructors lead participants through series of stretches that are designed to increase flexibility thus improving mobility.  These stretches are additional exercise for major muscle groups that are typically performed during regular warm up and cool down.   Hands Only CPR Anytime:  -Group instruction provided by verbal instruction, video, patient participation and written materials to support subject.  Instructors co-teach with AHA video for hands only CPR.  Participants get hands on experience with mannequins.   Nutrition I class: Heart Healthy Eating:  -Group instruction provided by PowerPoint slides, verbal discussion, and written  materials to support subject matter. The instructor gives an explanation and review of the Therapeutic Lifestyle Changes diet recommendations, which includes a discussion on lipid goals, dietary fat, sodium, fiber, plant stanol/sterol esters, sugar, and the components of a well-balanced, healthy diet.   Nutrition II class: Lifestyle Skills:  -Group instruction provided by PowerPoint slides, verbal discussion, and written materials to support subject matter. The instructor gives an explanation and review of label reading, grocery shopping for heart health, heart healthy recipe modifications, and ways to make healthier choices when eating out.   Diabetes Question & Answer:  -Group instruction provided by PowerPoint slides, verbal discussion, and written materials to support subject matter. The instructor gives an explanation and review of diabetes co-morbidities, pre- and post-prandial blood glucose goals, pre-exercise blood glucose goals, signs, symptoms, and treatment of hypoglycemia and hyperglycemia, and foot care basics.   Diabetes Blitz:  -Group instruction provided by PowerPoint slides, verbal discussion, and written materials to support subject matter. The instructor gives an explanation and review of the physiology behind type 1 and type 2 diabetes, diabetes medications and rational behind using different medications, pre- and post-prandial blood glucose recommendations and Hemoglobin A1c goals, diabetes diet, and exercise including blood glucose guidelines for exercising safely.    Portion Distortion:  -Group instruction provided by PowerPoint slides, verbal discussion, written materials, and food models to support subject matter. The instructor gives an explanation of serving size versus portion size, changes in portions sizes over the last 20 years, and what consists of a serving from each food group.   Stress Management:  -Group instruction provided by verbal instruction, video, and  written materials to support subject matter.  Instructors review role of stress in heart disease and how to cope with stress positively.            CARDIAC REHAB PHASE II EXERCISE from 12/16/2015 in [facility] CARDIAC REHAB   Date  12/16/15   Instruction Review Code  2- meets goals/outcomes      Exercising on Your Own:  -Group instruction provided by  verbal instruction, power point, and written materials to support subject.  Instructors discuss benefits of exercise, components of exercise, frequency and intensity of exercise, and end points for exercise.  Also discuss use of nitroglycerin and activating EMS.  Review options of places to exercise outside of rehab.  Review guidelines for sex with heart disease.   Cardiac Drugs I:  -Group instruction provided by verbal instruction and written materials to support subject.  Instructor reviews cardiac drug classes: antiplatelets, anticoagulants, beta blockers, and statins.  Instructor discusses reasons, side effects, and lifestyle considerations for each drug class.   Cardiac Drugs II:  -Group instruction provided by verbal instruction and written materials to support subject.  Instructor reviews cardiac drug classes: angiotensin converting enzyme inhibitors (ACE-I), angiotensin II receptor blockers (ARBs), nitrates, and calcium channel blockers.  Instructor discusses reasons, side effects, and lifestyle considerations for each drug class.   Anatomy and Physiology of the Circulatory System:  -Group instruction provided by verbal instruction, video, and written materials to support subject.  Reviews functional anatomy of heart, how it relates to various diagnoses, and what role the heart plays in the overall system.   Knowledge Questionnaire Score:     Knowledge Questionnaire Score - 12/08/15 1652    Knowledge Questionnaire Score   Pre Score 18/24      Core Components/Risk Factors/Patient Goals at Admission:      Personal Goals and Risk Factors at Admission - 12/08/15 1654    Core Components/Risk Factors/Patient Goals on Admission    Weight Management Yes;Weight Loss   Intervention Weight Management: Develop a combined nutrition and exercise program designed to reach desired caloric intake, while maintaining appropriate intake of nutrient and fiber, sodium and fats, and appropriate energy expenditure required for the weight goal.;Weight Management: Provide education and appropriate resources to help participant work on and attain dietary goals.;Weight Management/Obesity: Establish reasonable short term and long term weight goals.;Obesity: Provide education and appropriate resources to help participant work on and attain dietary goals.   Admit Weight 235 lb 14.3 oz (107 kg)   Goal Weight: Short Term 229 lb (103.874 kg)   Goal Weight: Long Term 200 lb (90.719 kg)   Expected Outcomes Short Term: Continue to assess and modify interventions until short term weight is achieved;Long Term: Adherence to nutrition and physical activity/exercise program aimed toward attainment of established weight goal;Weight Maintenance: Understanding of the daily nutrition guidelines, which includes 25-35% calories from fat, 7% or less cal from saturated fats, less than 200mg  cholesterol, less than 1.5gm of sodium, & 5 or more servings of fruits and vegetables daily;Weight Loss: Understanding of general recommendations for a balanced deficit meal plan, which promotes 1-2 lb weight loss per week and includes a negative energy balance of [phone] kcal/d;Understanding recommendations for meals to include 15-35% energy as protein, 25-35% energy from fat, 35-60% energy from carbohydrates, less than 200mg  of dietary cholesterol, 20-35 gm of total fiber daily;Understanding of distribution of calorie intake throughout the day with the consumption of 4-5 meals/snacks;Weight Gain: Understanding of general recommendations for a high calorie, high  protein meal plan that promotes weight gain by distributing calorie intake throughout the day with the consumption for 4-5 meals, snacks, and/or supplements   Personal Goal Other Yes   Personal Goal Improve overall health, heart stronger. Be able to walk better.   Intervention Develop exercise program for home in conjunction with cardiac rehab to improve cardiorespiratory fitness. Encourage attendance at education classes for risk factor modification.   Expected Outcomes Understand modifiable  risk factors for heart disease. Adhere to safe exercise parameters, continue walking program at home in addition to exercise at cardiac rehab.      Core Components/Risk Factors/Patient Goals Review:      Goals and Risk Factor Review      12/24/15 1648           Core Components/Risk Factors/Patient Goals Review   Personal Goals Review Other       Review pt has started walking at home for exercise 2x/week       Expected Outcomes pt will continue with HEP with less fatigue and SOB          Core Components/Risk Factors/Patient Goals at Discharge (Final Review):      Goals and Risk Factor Review - 12/24/15 1648    Core Components/Risk Factors/Patient Goals Review   Personal Goals Review Other   Review pt has started walking at home for exercise 2x/week   Expected Outcomes pt will continue with HEP with less fatigue and SOB      ITP Comments:     ITP Comments      12/08/15 1406           ITP Comments Dr. Armanda Magic, Medical Director           Comments: Pt is making expected progress toward personal goals after completing 9 sessions. Recommend continued exercise and life style modification education including  stress management and relaxation techniques to decrease cardiac risk profile.

## 2016-01-01 NOTE — Progress Notes (Addendum)
Cardiac Individual Treatment Plan  Patient Details  Name: Patrick Wilkins MRN: [id number] Date of Birth: 16-Mar-1956 Referring Provider:        CARDIAC REHAB PHASE II ORIENTATION from 12/08/2015 in [facility] CARD[facility]   Referring Provider  Hilty, [place] MD      Initial Encounter Date:       CARDIAC REHAB PHASE II ORIENTATION from 12/08/2015 in [facility] CARDIAC [facility]HAB   Date  12/08/15   Referring Provider  Hilty, [place] MD      Visit Diagnosis: No diagnosis found.  Patient's Home Medications on Admission:  Current outpatient prescriptions:  .  allopurinol (ZYLOPRIM) 100 MG tablet, Take 100 mg by mouth daily., Disp: , Rfl:  .  aspirin 81 MG tablet, Take 81 mg by mouth daily as needed for pain. For chest pain. Given by EMS, Disp: , Rfl:  .  atorvastatin (LIPITOR) 80 MG tablet, Take 1 tablet (80 mg total) by mouth daily at 6 PM., Disp: 30 tablet, Rfl: 6 .  carvedilol (COREG) 6.25 MG tablet, Take 1 tablet (6.25 mg total) by mouth 2 (two) times daily with a meal., Disp: 60 tablet, Rfl: 11 .  clonazePAM (KLONOPIN) 0.5 MG tablet, Take 0.5 mg by mouth 2 (two) times daily as needed for anxiety., Disp: , Rfl:  .  lisinopril (PRINIVIL,ZESTRIL) 10 MG tablet, Take 1 tablet (10 mg total) by mouth daily., Disp: 30 tablet, Rfl: 11 .  nitroGLYCERIN (NITROSTAT) 0.4 MG SL tablet, Place 1 tablet (0.4 mg total) under the tongue every 5 (five) minutes as needed for chest pain. Given by EMS, Disp: 25 tablet, Rfl: 12 .  omeprazole (PRILOSEC) 20 MG capsule, Take 40 mg by mouth 2 (two) times daily before a meal. , Disp: , Rfl:  .  psyllium (METAMUCIL) 58.6 % packet, Take 1 packet by mouth at bedtime., Disp: , Rfl:  .  sulfamethoxazole-trimethoprim (BACTRIM DS,SEPTRA DS) 800-160 MG tablet, Take 1 tablet by mouth 2 (two) times daily. Takes PRN for a few days when he feels a "boil" coming on - ok with cardiologist, Disp: , Rfl:  .  ticagrelor (BRILINTA) 90 MG TABS tablet, Take 1  tablet (90 mg total) by mouth 2 (two) times daily., Disp: 60 tablet, Rfl: 0 .  traMADol (ULTRAM) 50 MG tablet, Take 50 mg by mouth every 6 (six) hours as needed., Disp: , Rfl: 0 .  vitamin C (ASCORBIC ACID) 500 MG tablet, Take 500 mg by mouth daily., Disp: , Rfl:   Past Medical History: Past Medical History  Diagnosis Date  . Hypertension   . Anxiety   . CAD (coronary artery disease)     a. NSTEMI 10/30/15 s/p PTCA and DES of total occusion of  mid LAD. ostial and proximal LAD 50% stenosis, 50-70% PDA stenosis, and 70% stenosis of the first obtuse marginal.   . Hyperlipidemia   . NSTEMI (non-ST elevated myocardial infarction) (HCC) 10/2015  . Kidney stones   . Tuberculosis 1958  . Complication of anesthesia 1958    "died on the table when they did OR to dx TB"  . CHF (congestive heart failure) (HCC) 10/2015  . Childhood asthma   . GERD (gastroesophageal reflux disease)   . Arthritis     "right knee, back" (11/24/2015)    Tobacco Use: History  Smoking status  . Former Smoker -- 1.00 packs/day for 5 years  . Types: Cigarettes  Smokeless tobacco  . Former Neurosurgeon  . Types: Chew  Comment: "quit smoking cigarettes & chewing tobacco in my 20s"    Labs: Recent Review Flowsheet Data    Labs for ITP Cardiac and Pulmonary Rehab Latest Ref Rng 11/01/2015   Cholestrol 0 - 200 mg/dL 562   LDLCALC 0 - 99 mg/dL 130(Q)   HDL >65 mg/dL 78(I)   Trlycerides <696 mg/dL 295(M)      Capillary Blood Glucose: No results found for: GLUCAP   Exercise Target Goals:    Exercise Program Goal: Individual exercise prescription set with THRR, safety & activity barriers. Participant demonstrates ability to understand and report RPE using BORG scale, to self-measure pulse accurately, and to acknowledge the importance of the exercise prescription.  Exercise Prescription Goal: Starting with aerobic activity 30 plus minutes a day, 3 days per week for initial exercise prescription. Provide home exercise  prescription and guidelines that participant acknowledges understanding prior to discharge.  Activity Barriers & Risk Stratification:     Activity Barriers & Cardiac Risk Stratification - 12/08/15 1651    Activity Barriers & Cardiac Risk Stratification   Activity Barriers Back Problems;Other (comment)   Comments Hx torn cartilage in right knee   Cardiac Risk Stratification High      6 Minute Walk:     6 Minute Walk      12/08/15 1700       6 Minute Walk   Phase Initial     Distance 1549 feet     Walk Time 6 minutes     # of Rest Breaks 0     MPH 2.93     METS 3.49     RPE 11     VO2 Peak 12.22     Symptoms No     Resting HR 68 bpm     Resting BP 124/80 mmHg     Max Ex. HR 90 bpm     Max Ex. BP 122/72 mmHg     2 Minute Post BP 100/66 mmHg        Initial Exercise Prescription:     Initial Exercise Prescription - 12/08/15 1700    Date of Initial Exercise RX and Referring Provider   Date 12/08/15   Referring Provider Hilty, [place] MD   Bike   Level 1.4   Minutes 10   METs 3.46   NuStep   Level 3   Minutes 10   METs 2.8   Track   Laps 11   Minutes 10   METs 2.92   Prescription Details   Frequency (times per week) 3   Duration Progress to 30 minutes of continuous aerobic without signs/symptoms of physical distress   Intensity   THRR 40-80% of Max Heartrate 64-129   Ratings of Perceived Exertion 11-13   Perceived Dyspnea 0-4   Progression   Progression Continue to progress workloads to maintain intensity without signs/symptoms of physical distress.   Resistance Training   Training Prescription Yes   Weight 2-3lbs.   Reps 10-12      Perform Capillary Blood Glucose checks as needed.  Exercise Prescription Changes:     Exercise Prescription Changes      12/23/15 1700           Exercise Review   Progression Yes       Response to Exercise   Blood Pressure (Admit) 122/72 mmHg       Blood Pressure (Exercise) 142/72 mmHg       Blood Pressure  (Exit) 104/64 mmHg       Heart  Rate (Admit) 82 bpm       Heart Rate (Exercise) 108 bpm       Heart Rate (Exit) 82 bpm       Rating of Perceived Exertion (Exercise) 11       Comments reviewed HEP 12/21/15       Progression   Average METs 3.6       Resistance Training   Training Prescription Yes       Weight 3lbs       Reps 10-12       Bike   Level 1.4       Minutes 10       METs 3.46       NuStep   Level 3       Minutes 10       METs 2.8       Track   Laps 12       Minutes 10       METs 3.09       Home Exercise Plan   Plans to continue exercise at Home  plans to walk 3x/week        Frequency Add 3 additional days to program exercise sessions.          Exercise Comments:     Exercise Comments      12/23/15 1736           Exercise Comments Reviewed METs and goals. Pt is tolerating exercise well; will continue to monitor exercise progression          Discharge Exercise Prescription (Final Exercise Prescription Changes):     Exercise Prescription Changes - 12/23/15 1700    Exercise Review   Progression Yes   Response to Exercise   Blood Pressure (Admit) 122/72 mmHg   Blood Pressure (Exercise) 142/72 mmHg   Blood Pressure (Exit) 104/64 mmHg   Heart Rate (Admit) 82 bpm   Heart Rate (Exercise) 108 bpm   Heart Rate (Exit) 82 bpm   Rating of Perceived Exertion (Exercise) 11   Comments reviewed HEP 12/21/15   Progression   Average METs 3.6   Resistance Training   Training Prescription Yes   Weight 3lbs   Reps 10-12   Bike   Level 1.4   Minutes 10   METs 3.46   NuStep   Level 3   Minutes 10   METs 2.8   Track   Laps 12   Minutes 10   METs 3.09   Home Exercise Plan   Plans to continue exercise at Home  plans to walk 3x/week    Frequency Add 3 additional days to program exercise sessions.      Nutrition:  Target Goals: Understanding of nutrition guidelines, daily intake of sodium 1500mg , cholesterol 200mg , calories 30% from fat and 7% or less  from saturated fats, daily to have 5 or more servings of fruits and vegetables.  Biometrics:     Pre Biometrics - 12/08/15 1653    Pre Biometrics   Waist Circumference 47.75 inches   Hip Circumference 46.75 inches   Waist to Hip Ratio 1.02 %   Triceps Skinfold 18.5 mm   % Body Fat 33.4 %   Grip Strength 43.5 kg   Flexibility 0 in   Single Leg Stand 30 seconds       Nutrition Therapy Plan and Nutrition Goals:     Nutrition Therapy & Goals - 12/11/15 1010    Nutrition Therapy   Diet Therapeutic Lifestyle Changes  Personal Nutrition Goals   Personal Goal #1 0.5-2 lb wt loss per week to a goal wt loss of 6-24 lb at graduation from Cardiac Rehab.   Intervention Plan   Intervention Prescribe, educate and counsel regarding individualized specific dietary modifications aiming towards targeted core components such as weight, hypertension, lipid management, diabetes, heart failure and other comorbidities.   Expected Outcomes Short Term Goal: Understand basic principles of dietary content, such as calories, fat, sodium, cholesterol and nutrients.;Long Term Goal: Adherence to prescribed nutrition plan.      Nutrition Discharge: Nutrition Scores:     Nutrition Assessments - 12/23/15 1500    MEDFICTS Scores   Pre Score 6      Nutrition Goals Re-Evaluation:   Psychosocial: Target Goals: Acknowledge presence or absence of depression, maximize coping skills, provide positive support system. Participant is able to verbalize types and ability to use techniques and skills needed for reducing stress and depression.  Initial Review & Psychosocial Screening:     Initial Psych Review & Screening - 12/15/15 1217    Initial Review   Current issues with Current Anxiety/Panic   Family Dynamics   Good Support System? Yes   Barriers   Psychosocial barriers to participate in program The patient should benefit from training in stress management and relaxation.   Screening Interventions    Interventions Encouraged to exercise      Quality of Life Scores:     Quality of Life - 12/08/15 1659    Quality of Life Scores   Health/Function Pre 24.57 %   Socioeconomic Pre 27.5 %   Psych/Spiritual Pre 28.21 %   Family Pre 27.6 %   GLOBAL Pre 26.33 %      PHQ-9:     Recent Review Flowsheet Data    Depression screen [facility] 2/9 12/15/2015   Decreased Interest 0   Down, Depressed, Hopeless 0   PHQ - 2 Score 0      Psychosocial Evaluation and Intervention:     Psychosocial Evaluation - 01/01/16 1356    Psychosocial Evaluation & Interventions   Interventions Stress management education;Relaxation education;Encouraged to exercise with the program and follow exercise prescription   Continued Psychosocial Services Needed Yes      Psychosocial Re-Evaluation:   Vocational Rehabilitation: Provide vocational rehab assistance to qualifying candidates.   Vocational Rehab Evaluation & Intervention:     Vocational Rehab - 12/09/15 1008    Initial Vocational Rehab Evaluation & Intervention   Assessment shows need for Vocational Rehabilitation No      Education: Education Goals: Education classes will be provided on a weekly basis, covering required topics. Participant will state understanding/return demonstration of topics presented.  Learning Barriers/Preferences:     Learning Barriers/Preferences - 12/08/15 1651    Learning Barriers/Preferences   Learning Barriers None   Learning Preferences Written Material;Verbal Instruction;Skilled Demonstration;Video;Group Instruction;Individual Instruction      Education Topics: Count Your Pulse:  -Group instruction provided by verbal instruction, demonstration, patient participation and written materials to support subject.  Instructors address importance of being able to find your pulse and how to count your pulse when at home without a heart monitor.  Patients get hands on experience counting their pulse with staff help  and individually.   Heart Attack, Angina, and Risk Factor Modification:  -Group instruction provided by verbal instruction, video, and written materials to support subject.  Instructors address signs and symptoms of angina and heart attacks.    Also discuss risk factors for heart disease and  how to make changes to improve heart health risk factors.   Functional Fitness:  -Group instruction provided by verbal instruction, demonstration, patient participation, and written materials to support subject.  Instructors address safety measures for doing things around the house.  Discuss how to get up and down off the floor, how to pick things up properly, how to safely get out of a chair without assistance, and balance training.   Meditation and Mindfulness:  -Group instruction provided by verbal instruction, patient participation, and written materials to support subject.  Instructor addresses importance of mindfulness and meditation practice to help reduce stress and improve awareness.  Instructor also leads participants through a meditation exercise.    Stretching for Flexibility and Mobility:  -Group instruction provided by verbal instruction, patient participation, and written materials to support subject.  Instructors lead participants through series of stretches that are designed to increase flexibility thus improving mobility.  These stretches are additional exercise for major muscle groups that are typically performed during regular warm up and cool down.   Hands Only CPR Anytime:  -Group instruction provided by verbal instruction, video, patient participation and written materials to support subject.  Instructors co-teach with AHA video for hands only CPR.  Participants get hands on experience with mannequins.   Nutrition I class: Heart Healthy Eating:  -Group instruction provided by PowerPoint slides, verbal discussion, and written materials to support subject matter. The instructor gives  an explanation and review of the Therapeutic Lifestyle Changes diet recommendations, which includes a discussion on lipid goals, dietary fat, sodium, fiber, plant stanol/sterol esters, sugar, and the components of a well-balanced, healthy diet.   Nutrition II class: Lifestyle Skills:  -Group instruction provided by PowerPoint slides, verbal discussion, and written materials to support subject matter. The instructor gives an explanation and review of label reading, grocery shopping for heart health, heart healthy recipe modifications, and ways to make healthier choices when eating out.   Diabetes Question & Answer:  -Group instruction provided by PowerPoint slides, verbal discussion, and written materials to support subject matter. The instructor gives an explanation and review of diabetes co-morbidities, pre- and post-prandial blood glucose goals, pre-exercise blood glucose goals, signs, symptoms, and treatment of hypoglycemia and hyperglycemia, and foot care basics.   Diabetes Blitz:  -Group instruction provided by PowerPoint slides, verbal discussion, and written materials to support subject matter. The instructor gives an explanation and review of the physiology behind type 1 and type 2 diabetes, diabetes medications and rational behind using different medications, pre- and post-prandial blood glucose recommendations and Hemoglobin A1c goals, diabetes diet, and exercise including blood glucose guidelines for exercising safely.    Portion Distortion:  -Group instruction provided by PowerPoint slides, verbal discussion, written materials, and food models to support subject matter. The instructor gives an explanation of serving size versus portion size, changes in portions sizes over the last 20 years, and what consists of a serving from each food group.   Stress Management:  -Group instruction provided by verbal instruction, video, and written materials to support subject matter.  Instructors  review role of stress in heart disease and how to cope with stress positively.            CARDIAC REHAB PHASE II EXERCISE from 12/16/2015 in [facility] CARDIAC REHAB   Date  12/16/15   Instruction Review Code  2- meets goals/outcomes      Exercising on Your Own:  -Group instruction provided by verbal instruction, power point, and written materials to  support subject.  Instructors discuss benefits of exercise, components of exercise, frequency and intensity of exercise, and end points for exercise.  Also discuss use of nitroglycerin and activating EMS.  Review options of places to exercise outside of rehab.  Review guidelines for sex with heart disease.   Cardiac Drugs I:  -Group instruction provided by verbal instruction and written materials to support subject.  Instructor reviews cardiac drug classes: antiplatelets, anticoagulants, beta blockers, and statins.  Instructor discusses reasons, side effects, and lifestyle considerations for each drug class.   Cardiac Drugs II:  -Group instruction provided by verbal instruction and written materials to support subject.  Instructor reviews cardiac drug classes: angiotensin converting enzyme inhibitors (ACE-I), angiotensin II receptor blockers (ARBs), nitrates, and calcium channel blockers.  Instructor discusses reasons, side effects, and lifestyle considerations for each drug class.   Anatomy and Physiology of the Circulatory System:  -Group instruction provided by verbal instruction, video, and written materials to support subject.  Reviews functional anatomy of heart, how it relates to various diagnoses, and what role the heart plays in the overall system.   Knowledge Questionnaire Score:     Knowledge Questionnaire Score - 12/08/15 1652    Knowledge Questionnaire Score   Pre Score 18/24      Core Components/Risk Factors/Patient Goals at Admission:     Personal Goals and Risk Factors at Admission - 12/08/15 1654     Core Components/Risk Factors/Patient Goals on Admission    Weight Management Yes;Weight Loss   Intervention Weight Management: Develop a combined nutrition and exercise program designed to reach desired caloric intake, while maintaining appropriate intake of nutrient and fiber, sodium and fats, and appropriate energy expenditure required for the weight goal.;Weight Management: Provide education and appropriate resources to help participant work on and attain dietary goals.;Weight Management/Obesity: Establish reasonable short term and long term weight goals.;Obesity: Provide education and appropriate resources to help participant work on and attain dietary goals.   Admit Weight 235 lb 14.3 oz (107 kg)   Goal Weight: Short Term 229 lb (103.874 kg)   Goal Weight: Long Term 200 lb (90.719 kg)   Expected Outcomes Short Term: Continue to assess and modify interventions until short term weight is achieved;Long Term: Adherence to nutrition and physical activity/exercise program aimed toward attainment of established weight goal;Weight Maintenance: Understanding of the daily nutrition guidelines, which includes 25-35% calories from fat, 7% or less cal from saturated fats, less than 200mg  cholesterol, less than 1.5gm of sodium, & 5 or more servings of fruits and vegetables daily;Weight Loss: Understanding of general recommendations for a balanced deficit meal plan, which promotes 1-2 lb weight loss per week and includes a negative energy balance of [phone] kcal/d;Understanding recommendations for meals to include 15-35% energy as protein, 25-35% energy from fat, 35-60% energy from carbohydrates, less than 200mg  of dietary cholesterol, 20-35 gm of total fiber daily;Understanding of distribution of calorie intake throughout the day with the consumption of 4-5 meals/snacks;Weight Gain: Understanding of general recommendations for a high calorie, high protein meal plan that promotes weight gain by distributing calorie  intake throughout the day with the consumption for 4-5 meals, snacks, and/or supplements   Personal Goal Other Yes   Personal Goal Improve overall health, heart stronger. Be able to walk better.   Intervention Develop exercise program for home in conjunction with cardiac rehab to improve cardiorespiratory fitness. Encourage attendance at education classes for risk factor modification.   Expected Outcomes Understand modifiable risk factors for heart disease. Adhere to safe  exercise parameters, continue walking program at home in addition to exercise at cardiac rehab.      Core Components/Risk Factors/Patient Goals Review:      Goals and Risk Factor Review      12/24/15 1648           Core Components/Risk Factors/Patient Goals Review   Personal Goals Review Other       Review pt has started walking at home for exercise 2x/week       Expected Outcomes pt will continue with HEP with less fatigue and SOB          Core Components/Risk Factors/Patient Goals at Discharge (Final Review):      Goals and Risk Factor Review - 12/24/15 1648    Core Components/Risk Factors/Patient Goals Review   Personal Goals Review Other   Review pt has started walking at home for exercise 2x/week   Expected Outcomes pt will continue with HEP with less fatigue and SOB      ITP Comments:     ITP Comments      12/08/15 1406           ITP Comments Dr. Armanda Magicraci Turner, Medical Director           Comments: Pt is making expected progress toward personal goals after completing 9 sessions. Recommend continued exercise and life style modification education including  stress management and relaxation techniques to decrease cardiac risk profile.

## 2016-01-04 ENCOUNTER — Encounter: Payer: Self-pay | Admitting: *Deleted

## 2016-01-04 ENCOUNTER — Encounter (HOSPITAL_COMMUNITY)
Admission: RE | Admit: 2016-01-04 | Discharge: 2016-01-04 | Disposition: A | Discharge status: Home / Self Care | Payer: BLUE CROSS/BLUE SHIELD | Source: Ambulatory Visit | Attending: Internal Medicine | Admitting: Internal Medicine

## 2016-01-04 ENCOUNTER — Ambulatory Visit (HOSPITAL_COMMUNITY)
Admission: RE | Admit: 2016-01-04 | Discharge: 2016-01-04 | Disposition: A | Discharge status: Home / Self Care | Payer: BLUE CROSS/BLUE SHIELD | Source: Ambulatory Visit | Attending: Internal Medicine | Admitting: Internal Medicine

## 2016-01-04 ENCOUNTER — Telehealth: Payer: Self-pay | Admitting: Internal Medicine

## 2016-01-04 ENCOUNTER — Other Ambulatory Visit: Payer: Self-pay

## 2016-01-04 DIAGNOSIS — R079 Chest pain, unspecified: Secondary | ICD-10-CM | POA: Diagnosis not present

## 2016-01-04 DIAGNOSIS — I214 Non-ST elevation (NSTEMI) myocardial infarction: Secondary | ICD-10-CM

## 2016-01-04 DIAGNOSIS — Z955 Presence of coronary angioplasty implant and graft: Secondary | ICD-10-CM

## 2016-01-04 NOTE — Telephone Encounter (Signed)
Please call,she had talked to Stanton KidneyDebra earlier about this pt.

## 2016-01-04 NOTE — Telephone Encounter (Signed)
Spoke with joann, she wanted to make us aware the pt has had chest pain relieved with rest over the weekend. He has not used NTG because the pain resolves quickly with rest. The pain is similar to his previous heart pain. They feel the pt is okay to exercise.

## 2016-01-04 NOTE — Progress Notes (Signed)
Daily Session Note  Patient Details  Name: Patrick Wilkins MRN: [id number] Date of Birth: July 15, 1955 Referring Provider:        CARDIAC REHAB PHASE II ORIENTATION from 12/08/2015 in [place]   Referring Provider  Hilty, [place] MD      Encounter Date: 01/04/2016  Check In:     Session Check In - 01/04/16 1442    Check-In   Location [facility]   Staff Present Andi Hence, RN, BSN;Amber Fair, MS, ACSM RCEP, Exercise Physiologist;Olinty [place], MS, ACSM CEP, Exercise Physiologist;Maria Whitaker, RN, BSN   Supervising physician immediately available to respond to emergencies [facility] immediately available   Physician(s) Dr. Marily Memos    Medication changes reported     No   Fall or balance concerns reported    No   Warm-up and Cool-down Performed as group-led instruction   Resistance Training Performed Yes   VAD Patient? No   Pain Assessment   Currently in Pain? Yes   Pain Score 1    Pain Location Chest   Pain Orientation Left   Pain Descriptors / Indicators Discomfort   Pain Type Other (Comment)   Pain Radiating Towards 12 lead obtained. Dr [facility] office notified   Pain Onset Today      Capillary Blood Glucose: No results found for this or any previous visit (from the past 24 hour(s)).   Goals Met:  Exercise tolerated well  Goals Unmet:  c/o chest pain with exercise   Comments: pt arrived at cardiac rehab c/o chest pain this weekend that occurred with walking and intercourse.  Pt rated 5/10.  Pt states the discomfort was relieved with rest, therefore pt did not take nitroglycerin.  PC to Neoma Laming at Dr. Lysbeth Penner office to discuss. Clearance given for pt to participate in cardiac rehab exercise.  When exercise began, Pt developed chest pain with first station the nustep. Pt rated 1/10.  Pt did was relieved with rest after 5 minutes.   12 lead EKG performed.  Pt able to return to exercise walking the track with only mild  symptoms.  PC to Neoma Laming, Dr. Lysbeth Penner nurse, who will discuss with Dr. Debara Pickett.   Neoma Laming also informed pt is awaiting return call from office about medication adjustments.     Dr. Fransico Him is Medical Director for Cardiac Rehab at [facility].

## 2016-01-04 NOTE — Telephone Encounter (Signed)
Pt calling regarding 12-31-15 pain in heart after coitus 10 min-pain level 1-2-no nitro was taken-Saturday went for a walk about 15 min later pain came back pain level 1-2 about 2 minutessharpe lasted about 10 mins total no nitro- also [place] a [place] but was out of town- pls advise [phone]

## 2016-01-04 NOTE — Telephone Encounter (Signed)
Spoke with Patrick Wilkins, the pt had chest discomfort while using the stepper, he rated it as a 1 out of 10. The discomfort went away with rest. He then developed the same discomfort while walking the track. His 12 lead ECG was normal. No symptoms of SOB, radiation, sweating or nausea. Will make dr hilty aware

## 2016-01-05 NOTE — Telephone Encounter (Signed)
Follow up scheduled

## 2016-01-05 NOTE — Telephone Encounter (Signed)
We need to get him in to be seen asap - ok to add on tomorrow at the end of my quarter day to discuss medical therapy or possible cath for ongoing angina.  Dr. HRexene Edison

## 2016-01-05 NOTE — Telephone Encounter (Signed)
Left message for pt to call.

## 2016-01-06 ENCOUNTER — Encounter (HOSPITAL_COMMUNITY)
Admission: RE | Admit: 2016-01-06 | Discharge: 2016-01-06 | Disposition: A | Discharge status: Home / Self Care | Payer: BLUE CROSS/BLUE SHIELD | Source: Ambulatory Visit | Attending: Internal Medicine | Admitting: Internal Medicine

## 2016-01-06 ENCOUNTER — Telehealth (HOSPITAL_COMMUNITY): Payer: Self-pay | Admitting: Cardiac Rehabilitation

## 2016-01-06 ENCOUNTER — Ambulatory Visit (INDEPENDENT_AMBULATORY_CARE_PROVIDER_SITE_OTHER): Payer: BLUE CROSS/BLUE SHIELD | Admitting: Internal Medicine

## 2016-01-06 ENCOUNTER — Encounter: Payer: Self-pay | Admitting: Internal Medicine

## 2016-01-06 VITALS — BP 110/66 | HR 76 | Ht 71.0 in | Wt 228.0 lb

## 2016-01-06 DIAGNOSIS — I208 Other forms of angina pectoris: Secondary | ICD-10-CM | POA: Diagnosis not present

## 2016-01-06 DIAGNOSIS — I25110 Atherosclerotic heart disease of native coronary artery with unstable angina pectoris: Secondary | ICD-10-CM | POA: Diagnosis not present

## 2016-01-06 DIAGNOSIS — I214 Non-ST elevation (NSTEMI) myocardial infarction: Secondary | ICD-10-CM | POA: Diagnosis not present

## 2016-01-06 DIAGNOSIS — Z955 Presence of coronary angioplasty implant and graft: Secondary | ICD-10-CM

## 2016-01-06 DIAGNOSIS — I10 Essential (primary) hypertension: Secondary | ICD-10-CM | POA: Diagnosis not present

## 2016-01-06 DIAGNOSIS — I2510 Atherosclerotic heart disease of native coronary artery without angina pectoris: Secondary | ICD-10-CM | POA: Insufficient documentation

## 2016-01-06 MED ORDER — RANOLAZINE ER 1000 MG PO TB12: 1000.0000 mg | ORAL_TABLET | Freq: Two times a day (BID) | ORAL | Status: DC

## 2016-01-06 NOTE — Patient Instructions (Signed)
Your physician has recommended you make the following change in your medication...  1. START ranexa 1000mg  - twice daily 2. START L-Arginine 300mg  - three times daily (over the counter) 3. START Co-Q10 300mg  - once daily (over the counter)   Your physician recommends that you schedule a follow-up appointment in TWO WEEKS with Dr. Rennis GoldenHilty (OK to double book)

## 2016-01-06 NOTE — Progress Notes (Signed)
Patient ID: Patrick Wilkins, male   DOB: [birth year], 60 y.o.   MRN: [id number]    Date:  01/06/2016   ID:  Patrick Wilkins, DOB [birth year], MRN [id number]  PCP:  Patrick BlossomMORGAN, MELVIN K II, MD  Primary Cardiologist:  Navosilty  Chief Complaint  Patient presents with  . Follow-up    pt c/o pain on right side of chest starting last week     History of Present Illness: Patrick Wilkins is a 60 y.o. male history of tobacco abuse but no known cardiac history presented 10/30/15 with chest pain. Reports starting around 6pm onset of 10/10 pressure in midchest with diaphoresis and SOB. Symptoms improved but not resolved with SL NG x3, also received ASA in the field and 6mg  of IV morphine. Pain down from 10/10 to 5/10 but not resolved. Brought in as code STEMI by EMS. Initial troponin of 0.11.   Cath with multivessel CAD. S/p PTCA and DES of total occlusion of mid LAD. Continued ASA/ brilinta/ statin/ lisinopril and coreg. Continue to have mild chest discomfort. Due to elevated BP, lisinopril dose was adjusted. The patient was given IV lasix for mild right basilar crackles and EF of 40-45% on cath. Echo showed improved EF to 50-55%, grade 2 DD, trace MR and TR. Dyspnea has resolved with lasix, felt he will not need lasix at home. Changed protonix back to omeprazole at discharge. The patient takes Celebrex for DDD, advised to avoid celebrex/ NSAIDs if able.  Patient presents for posthospital follow-up. He reports doing well. He is walking 15 minutes twice a day without any problems. He is taking Tylenol instead of Celebrex for his back. He has requested tramadol which helped in the hospital. He is taking all medications as prescribed. Did state that he was having some quivering in his heart/nervousness 1 day or so he took his clonazepam and that resolved it.   The patient currently denies nausea, vomiting, fever, chest pain, shortness of breath, orthopnea, dizziness, PND, cough, congestion, abdominal pain, hematochezia,  melena, lower extremity edema, claudication.  12/08/2015  [name] for follow-up. He was seen after his recent acute MI by 2 physician assistants. The second office visit was for acute chest pain. There was concern as he had some moderate lesions on That were not revascularized that he may be having additional ischemia. He was admitted and ruled out for MI. He underwent nuclear stress testing which was negative for ischemia. He was then discharged. He says he has had no further chest pain symptoms. He is on low-dose imdur 15 mg daily. He's been compliant with aspirin and Brilinta and is exercising on his own. He says disease consider cardiac rehabilitation however it would cost him over $100 per session which is quite expensive. He may likely be able to do this exercise on his own. Today's asking if he could use Bactrim as needed for what sounds like recurrent abscesses. There is no issue that I have with it. He does get some dizziness with quick position changes. He denies any further chest pain.  01/06/2016  Patrick Wilkins returns today for follow-up. I received several messages from cardiac rehabilitation regarding ongoing chest pain symptoms. These have occurred with exercise, particularly cardiac rehabilitation and with sexual activity. He has stopped taking isosorbide due to headaches and intolerance to that medication and noted that it was not particularly helpful with chest pain. 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 In fact he was tearful in the office today and says that he is considered switching careers although does not know what he would do at this late life.  Wt Readings from Last 3 Encounters:  01/06/16 228 lb (103.42 kg)  12/08/15 235 lb 14.3 oz (107 kg)  12/08/15 234 lb 6.4 oz (106.323 kg)     Past Medical History  Diagnosis Date  . Hypertension   . Anxiety   . CAD (coronary artery disease)     a. NSTEMI 10/30/15 s/p  PTCA and DES of total occusion of  mid LAD. ostial and proximal LAD 50% stenosis, 50-70% PDA stenosis, and 70% stenosis of the first obtuse marginal.   . Hyperlipidemia   . NSTEMI (non-ST elevated myocardial infarction) (HCC) 10/2015  . Kidney stones   . Tuberculosis 1958  . Complication of anesthesia 1958    "died on the table when they did OR to dx TB"  . CHF (congestive heart failure) (HCC) 10/2015  . Childhood asthma   . GERD (gastroesophageal reflux disease)   . Arthritis     "right knee, back" (11/24/2015)    Current Outpatient Prescriptions  Medication Sig Dispense Refill  . allopurinol (ZYLOPRIM) 100 MG tablet Take 100 mg by mouth daily.    Marland Kitchen. aspirin 81 MG tablet Take 81 mg by mouth daily as needed for pain. For chest pain. Given by EMS    . atorvastatin (LIPITOR) 80 MG tablet Take 1 tablet (80 mg total) by mouth daily at 6 PM. 30 tablet 6  . carvedilol (COREG) 6.25 MG tablet Take 1 tablet (6.25 mg total) by mouth 2 (two) times daily with a meal. 60 tablet 11  . clonazePAM (KLONOPIN) 0.5 MG tablet Take 0.5 mg by mouth 2 (two) times daily as needed for anxiety.    Marland Kitchen. lisinopril (PRINIVIL,ZESTRIL) 10 MG tablet Take 1 tablet (10 mg total) by mouth daily. 30 tablet 11  . nitroGLYCERIN (NITROSTAT) 0.4 MG SL tablet Place 1 tablet (0.4 mg total) under the tongue every 5 (five) minutes as needed for chest pain. Given by EMS 25 tablet 12  . omeprazole (PRILOSEC) 20 MG capsule Take 40 mg by mouth 2 (two) times daily before a meal.     . psyllium (METAMUCIL) 58.6 % packet Take 1 packet by mouth at bedtime.    . sulfamethoxazole-trimethoprim (BACTRIM DS,SEPTRA DS) 800-160 MG tablet Take 1 tablet by mouth 2 (two) times daily. Takes PRN for a few days when he feels a "boil" coming on - ok with cardiologist    . ticagrelor (BRILINTA) 90 MG TABS tablet Take 1 tablet (90 mg total) by mouth 2 (two) times daily. 60 tablet 0  . traMADol (ULTRAM) 50 MG tablet Take 50 mg by mouth every 6 (six) hours as  needed.  0  . vitamin C (ASCORBIC ACID) 500 MG tablet Take 500 mg by mouth daily.     No current facility-administered medications for this visit.    Allergies:    Allergies  Allergen Reactions  . Other Anaphylaxis    [place] nuts: Stop breathing   . Tyloxapol Itching and Nausea And Vomiting    Itching and vomiting  Tolerates other forms of oxycodone    Social History:  The patient  reports that he has quit smoking. His smoking use included Cigarettes. He has a 5 pack-year smoking history. He has quit using smokeless tobacco. His smokeless tobacco use included Chew. He reports that he drinks alcohol. He reports that he  does not use illicit drugs.   Family history:   Family History  Problem Relation Age of Onset  . Heart attack Paternal Uncle   . Hypertension Maternal Grandmother   . Hypertension Maternal Grandfather     ROS:  Please see the history of present illness.  All other systems reviewed and negative.   PHYSICAL EXAM: VS:  BP 110/66 mmHg  Pulse 76  Ht  (1.803 m)  Wt 228 lb (103.42 kg)  BMI 31.81 kg/m2  SpO2 96% Deferred  EKG:   Deferred  ASSESSMENT:  Problem List Items Addressed This Visit    None     PLAN:  Patrick Wilkins continues to have chest pain symptoms but had a nuclear stress test that was negative for ischemia despite moderate disease in the RCA and branch disease of the diagonal. He did not have much improvement with long-acting nitrates and had side effects of headaches. I would like to try Ranexa 1000 mg twice a day and will start L-arginine 300 mg 3 times a day and coenzyme Q10 300 mg daily for anti-ischemic benefit if this is small vessel disease. Lan to see him back in 2-3 weeks and if his symptoms are not improved, he may need repeat cardiac catheterization possible FFR evaluation of his moderate coronary stenoses to see if they are flow limiting. He may participate in particular dilatation however may wish to shoot for a lower target that  does not cause angina.  Chrystie Nose, MD, [facility] Attending Cardiologist [facility]

## 2016-01-06 NOTE — Telephone Encounter (Signed)
-----   Message from Chrystie NoseKenneth C Hilty, MD sent at 01/06/2016 10:41 AM EDT ----- Regarding: cardiac rehab I've started him on Ranexa and added supplements for antianginal effect. He is scheduled for rehabilitation today and I think he can participate however perhaps at a lower metabolic target. If he has recurrent chest discomfort he may need to stay home from cardiac rehabilitation until we see if he benefits from this new medication.  Thanks.  Dr. Rennis GoldenHilty

## 2016-01-08 ENCOUNTER — Encounter (HOSPITAL_COMMUNITY)
Admission: RE | Admit: 2016-01-08 | Discharge: 2016-01-08 | Disposition: A | Discharge status: Home / Self Care | Payer: BLUE CROSS/BLUE SHIELD | Source: Ambulatory Visit | Attending: Internal Medicine | Admitting: Internal Medicine

## 2016-01-08 DIAGNOSIS — Z955 Presence of coronary angioplasty implant and graft: Secondary | ICD-10-CM

## 2016-01-08 DIAGNOSIS — I214 Non-ST elevation (NSTEMI) myocardial infarction: Secondary | ICD-10-CM | POA: Diagnosis not present

## 2016-01-11 ENCOUNTER — Encounter (HOSPITAL_COMMUNITY)
Admission: RE | Admit: 2016-01-11 | Discharge: 2016-01-11 | Disposition: A | Discharge status: Home / Self Care | Payer: BLUE CROSS/BLUE SHIELD | Source: Ambulatory Visit | Attending: Internal Medicine | Admitting: Internal Medicine

## 2016-01-11 DIAGNOSIS — E785 Hyperlipidemia, unspecified: Secondary | ICD-10-CM | POA: Insufficient documentation

## 2016-01-11 DIAGNOSIS — I509 Heart failure, unspecified: Secondary | ICD-10-CM | POA: Insufficient documentation

## 2016-01-11 DIAGNOSIS — Z87891 Personal history of nicotine dependence: Secondary | ICD-10-CM | POA: Insufficient documentation

## 2016-01-11 DIAGNOSIS — I2510 Atherosclerotic heart disease of native coronary artery without angina pectoris: Secondary | ICD-10-CM | POA: Diagnosis not present

## 2016-01-11 DIAGNOSIS — Z79899 Other long term (current) drug therapy: Secondary | ICD-10-CM | POA: Diagnosis not present

## 2016-01-11 DIAGNOSIS — I110 Hypertensive heart disease with heart failure: Secondary | ICD-10-CM | POA: Insufficient documentation

## 2016-01-11 DIAGNOSIS — Z7982 Long term (current) use of aspirin: Secondary | ICD-10-CM | POA: Diagnosis not present

## 2016-01-11 DIAGNOSIS — K219 Gastro-esophageal reflux disease without esophagitis: Secondary | ICD-10-CM | POA: Insufficient documentation

## 2016-01-11 DIAGNOSIS — I214 Non-ST elevation (NSTEMI) myocardial infarction: Secondary | ICD-10-CM

## 2016-01-11 DIAGNOSIS — Z955 Presence of coronary angioplasty implant and graft: Secondary | ICD-10-CM

## 2016-01-13 ENCOUNTER — Encounter (HOSPITAL_COMMUNITY)
Admission: RE | Admit: 2016-01-13 | Discharge: 2016-01-13 | Disposition: A | Discharge status: Home / Self Care | Payer: BLUE CROSS/BLUE SHIELD | Source: Ambulatory Visit | Attending: Internal Medicine | Admitting: Internal Medicine

## 2016-01-13 DIAGNOSIS — I214 Non-ST elevation (NSTEMI) myocardial infarction: Secondary | ICD-10-CM

## 2016-01-13 DIAGNOSIS — Z955 Presence of coronary angioplasty implant and graft: Secondary | ICD-10-CM

## 2016-01-15 ENCOUNTER — Encounter (HOSPITAL_COMMUNITY)
Admission: RE | Admit: 2016-01-15 | Discharge: 2016-01-15 | Disposition: A | Discharge status: Home / Self Care | Payer: BLUE CROSS/BLUE SHIELD | Source: Ambulatory Visit | Attending: Internal Medicine | Admitting: Internal Medicine

## 2016-01-15 DIAGNOSIS — Z955 Presence of coronary angioplasty implant and graft: Secondary | ICD-10-CM

## 2016-01-15 DIAGNOSIS — I214 Non-ST elevation (NSTEMI) myocardial infarction: Secondary | ICD-10-CM

## 2016-01-18 ENCOUNTER — Telehealth: Payer: Self-pay | Admitting: Internal Medicine

## 2016-01-18 ENCOUNTER — Encounter (HOSPITAL_COMMUNITY)
Admission: RE | Admit: 2016-01-18 | Discharge: 2016-01-18 | Disposition: A | Discharge status: Home / Self Care | Payer: BLUE CROSS/BLUE SHIELD | Source: Ambulatory Visit | Attending: Internal Medicine | Admitting: Internal Medicine

## 2016-01-18 DIAGNOSIS — I214 Non-ST elevation (NSTEMI) myocardial infarction: Secondary | ICD-10-CM

## 2016-01-18 DIAGNOSIS — Z955 Presence of coronary angioplasty implant and graft: Secondary | ICD-10-CM

## 2016-01-18 NOTE — Telephone Encounter (Signed)
500 mg BID is fine - those meds should not cause sweating. Good that he has not had chest pain. Will see him on 7/19.  Dr. HRexene Edison

## 2016-01-18 NOTE — Telephone Encounter (Signed)
Returned call to patient. He said that he has been having episodes of sweating a lot, sometimes accompanied by lightheadedness. He said it does not happen everyday. 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 After church, he felt "heavy" in his arms/legs and sat down and rested the rest of the day. Today, Monday, he has "felt fine." BP Saturday was 112/69, HR 66 and Sunday was 130/76, HR 63. He wonders if this excess sweating is from starting Co-Q 10 and L-Arginine from his visit on 01/06/16.  He said the first week he felt nausea in the morning after taking but this has subsided.  He denies CP, SOB, palpitations, blurred vision and headache.  He is aware of his appt for 01/27/16 with Dr Rennis GoldenHilty.  He also wanted to make sure it was ok to take L-Arginine 500 mg twice a day because he could not find 300mg  tablets anywhere.  He has been taking the L-Arginine 500mg  by mouth twice a day.  Will route to Dr Rennis GoldenHilty for advice.

## 2016-01-18 NOTE — Telephone Encounter (Signed)
New message    The pt starting sweating standing beside a air conditioner, on Saturday while visiting someone.  On Sunday the legs and arms felt like he could barley hold him up the legs and arms per pt felt like concrete  The pt went home and rested in the recliner rest of day   Pt c/o BP issue: STAT if pt c/o blurred vision, one-sided weakness or slurred speech  1. What are your last 5 BP readings? 128/68 and pulse 71  2. Are you having any other symptoms (ex. Dizziness, headache, blurred vision, passed out)? no  3. What is your BP issue? Per pt the blood pressure over the weekend seemed to ok   The pt was instructed to take over the counter vitamin the pt can not find 300 mg to take 3 times daily  Pt c/o medication issue:  1. Name of Medication: L-arginine  2. How are you currently taking this medication (dosage and times per day)? Smallest 500 mg twice daiy  3. Are you having a reaction (difficulty breathing--STAT)? No  4. What is your medication issue? The pt does not now if that is the reason.

## 2016-01-18 NOTE — Telephone Encounter (Signed)
Called pt back and gave him Dr Blanchie DessertHilty's advice. He verbalized understanding.

## 2016-01-20 ENCOUNTER — Encounter (HOSPITAL_COMMUNITY)
Admission: RE | Admit: 2016-01-20 | Discharge: 2016-01-20 | Disposition: A | Discharge status: Home / Self Care | Payer: BLUE CROSS/BLUE SHIELD | Source: Ambulatory Visit | Attending: Internal Medicine | Admitting: Internal Medicine

## 2016-01-20 DIAGNOSIS — Z955 Presence of coronary angioplasty implant and graft: Secondary | ICD-10-CM

## 2016-01-20 DIAGNOSIS — I214 Non-ST elevation (NSTEMI) myocardial infarction: Secondary | ICD-10-CM | POA: Diagnosis not present

## 2016-01-22 ENCOUNTER — Encounter (HOSPITAL_COMMUNITY)
Admission: RE | Admit: 2016-01-22 | Discharge: 2016-01-22 | Disposition: A | Discharge status: Home / Self Care | Payer: BLUE CROSS/BLUE SHIELD | Source: Ambulatory Visit | Attending: Internal Medicine | Admitting: Internal Medicine

## 2016-01-22 DIAGNOSIS — I214 Non-ST elevation (NSTEMI) myocardial infarction: Secondary | ICD-10-CM | POA: Diagnosis not present

## 2016-01-22 DIAGNOSIS — Z955 Presence of coronary angioplasty implant and graft: Secondary | ICD-10-CM

## 2016-01-22 NOTE — Progress Notes (Signed)
Cardiac Individual Treatment Plan  Patient Details  Name: Patrick Wilkins MRN: [id number] Date of Birth: [birth year] Referring Provider:        CARDIAC REHAB PHASE II ORIENTATION from 12/08/2015 in [facility] CARDI[facility]   Referring Provider  Hilty, [place] MD      Initial Encounter Date:       CARDIAC REHAB PHASE II ORIENTATION from 12/08/2015 in [facility] CARDIAC REHAB   Date  12/08/15   Referring Provider  Hilty, [place] MD      Visit Diagnosis: NSTEMI (non-ST elevated myocardial infarction) (HCC)  Stented coronary artery  Patient's Home Medications on Admission:  Current outpatient prescriptions:  .  allopurinol (ZYLOPRIM) 100 MG tablet, Take 100 mg by mouth daily., Disp: , Rfl:  .  aspirin 81 MG tablet, Take 81 mg by mouth daily as needed for pain. For chest pain. Given by EMS, Disp: , Rfl:  .  atorvastatin (LIPITOR) 80 MG tablet, Take 1 tablet (80 mg total) by mouth daily at 6 PM., Disp: 180 tablet, Rfl: 3 .  carvedilol (COREG) 6.25 MG tablet, Take 1 tablet (6.25 mg total) by mouth 2 (two) times daily with a meal., Disp: 180 tablet, Rfl: 3 .  clonazePAM (KLONOPIN) 0.5 MG tablet, Take 0.5 mg by mouth 2 (two) times daily as needed for anxiety., Disp: , Rfl:  .  Coenzyme Q10 (CO Q-10) 300 MG CAPS, Take 1 capsule by mouth daily., Disp: , Rfl:  .  L-ARGININE PO, Take 300 mg by mouth 3 (three) times daily., Disp: , Rfl:  .  lisinopril (PRINIVIL,ZESTRIL) 10 MG tablet, Take 1 tablet (10 mg total) by mouth daily., Disp: 180 tablet, Rfl: 3 .  nitroGLYCERIN (NITROSTAT) 0.4 MG SL tablet, Place 1 tablet (0.4 mg total) under the tongue every 5 (five) minutes as needed for chest pain. Given by EMS, Disp: 25 tablet, Rfl: 12 .  omeprazole (PRILOSEC) 20 MG capsule, Take 40 mg by mouth 2 (two) times daily before a meal. , Disp: , Rfl:  .  psyllium (METAMUCIL) 58.6 % packet, Take 1 packet by mouth at bedtime., Disp: , Rfl:  .  ranolazine (RANEXA) 1000 MG SR tablet,  Take 1 tablet (1,000 mg total) by mouth 2 (two) times daily., Disp: 60 tablet, Rfl: 6 .  sulfamethoxazole-trimethoprim (BACTRIM DS,SEPTRA DS) 800-160 MG tablet, Take 1 tablet by mouth 2 (two) times daily. Takes PRN for a few days when he feels a "boil" coming on - ok with cardiologist, Disp: , Rfl:  .  ticagrelor (BRILINTA) 90 MG TABS tablet, Take 1 tablet (90 mg total) by mouth 2 (two) times daily., Disp: 180 tablet, Rfl: 3 .  traMADol (ULTRAM) 50 MG tablet, Take 50 mg by mouth every 6 (six) hours as needed., Disp: , Rfl: 0 .  vitamin C (ASCORBIC ACID) 500 MG tablet, Take 500 mg by mouth daily., Disp: , Rfl:   Past Medical History: Past Medical History  Diagnosis Date  . Hypertension   . Anxiety   . CAD (coronary artery disease)     a. NSTEMI 10/30/15 s/p PTCA and DES of total occusion of  mid LAD. ostial and proximal LAD 50% stenosis, 50-70% PDA stenosis, and 70% stenosis of the first obtuse marginal.   . Hyperlipidemia   . NSTEMI (non-ST elevated myocardial infarction) (HCC) 10/2015  . Kidney stones   . Tuberculosis 1958  . Complication of anesthesia 1958    "died on the table when they did OR to  dx TB"  . CHF (congestive heart failure) (HCC) 10/2015  . Childhood asthma   . GERD (gastroesophageal reflux disease)   . Arthritis     "right knee, back" (11/24/2015)    Tobacco Use: History  Smoking status  . Former Smoker -- 1.00 packs/day for 5 years  . Types: Cigarettes  Smokeless tobacco  . Former NeurosurgeonUser  . Types: Chew    Comment: "quit smoking cigarettes & chewing tobacco in my 20s"    Labs:     Recent Review Flowsheet Data    Labs for ITP Cardiac and Pulmonary Rehab Latest Ref Rng 11/01/2015   Cholestrol 0 - 200 mg/dL 161171   LDLCALC 0 - 99 mg/dL 096(E100(H)   HDL >45>40 mg/dL 40(J36(L)   Trlycerides <811<150 mg/dL 914(N173(H)      Capillary Blood Glucose: No results found for: GLUCAP   Exercise Target Goals:    Exercise Program Goal: Individual exercise prescription set with THRR,  safety & activity barriers. Participant demonstrates ability to understand and report RPE using BORG scale, to self-measure pulse accurately, and to acknowledge the importance of the exercise prescription.  Exercise Prescription Goal: Starting with aerobic activity 30 plus minutes a day, 3 days per week for initial exercise prescription. Provide home exercise prescription and guidelines that participant acknowledges understanding prior to discharge.  Activity Barriers & Risk Stratification:     Activity Barriers & Cardiac Risk Stratification - 12/08/15 1651    Activity Barriers & Cardiac Risk Stratification   Activity Barriers Back Problems;Other (comment)   Comments Hx torn cartilage in right knee   Cardiac Risk Stratification High      6 Minute Walk:     6 Minute Walk      12/08/15 1700       6 Minute Walk   Phase Initial     Distance 1549 feet     Walk Time 6 minutes     # of Rest Breaks 0     MPH 2.93     METS 3.49     RPE 11     VO2 Peak 12.22     Symptoms No     Resting HR 68 bpm     Resting BP 124/80 mmHg     Max Ex. HR 90 bpm     Max Ex. BP 122/72 mmHg     2 Minute Post BP 100/66 mmHg        Initial Exercise Prescription:     Initial Exercise Prescription - 12/08/15 1700    Date of Initial Exercise RX and Referring Provider   Date 12/08/15   Referring Provider Hilty, [place] MD   Bike   Level 1.4   Minutes 10   METs 3.46   NuStep   Level 3   Minutes 10   METs 2.8   Track   Laps 11   Minutes 10   METs 2.92   Prescription Details   Frequency (times per week) 3   Duration Progress to 30 minutes of continuous aerobic without signs/symptoms of physical distress   Intensity   THRR 40-80% of Max Heartrate 64-129   Ratings of Perceived Exertion 11-13   Perceived Dyspnea 0-4   Progression   Progression Continue to progress workloads to maintain intensity without signs/symptoms of physical distress.   Resistance Training   Training Prescription Yes    Weight 2-3lbs.   Reps 10-12      Perform Capillary Blood Glucose checks as needed.  Exercise Prescription Changes:  Exercise Prescription Changes      12/23/15 1700 01/11/16 1400 01/18/16 1600       Exercise Review   Progression Yes Yes Yes     Response to Exercise   Blood Pressure (Admit) 122/72 mmHg 108/70 mmHg 108/60 mmHg     Blood Pressure (Exercise) 142/72 mmHg 134/64 mmHg 130/80 mmHg     Blood Pressure (Exit) 104/64 mmHg 112/70 mmHg 104/60 mmHg     Heart Rate (Admit) 82 bpm 74 bpm 76 bpm     Heart Rate (Exercise) 108 bpm 101 bpm 98 bpm     Heart Rate (Exit) 82 bpm 77 bpm 73 bpm     Rating of Perceived Exertion (Exercise) 11 12 12      Comments reviewed HEP 12/21/15 reviewed HEP 12/21/15 reviewed HEP 12/21/15     Progression   Average METs 3.6 3 3.2     Resistance Training   Training Prescription Yes Yes Yes     Weight 3lbs 5lbs 5lbs     Reps 10-12 10-12 10-12     Bike   Level 1.4 1   decr. WL per MD, pt had cath. done on 6/28 1   decr. WL per MD, pt had cath. done on 6/28     Minutes 10 10 10      METs 3.46 2.82 2.82     NuStep   Level 3 4 4      Minutes 10 10 10      METs 2.8 3 2.9     Track   Laps 12 12 14      Minutes 10 10 10      METs 3.09 3.09 3.43     Home Exercise Plan   Plans to continue exercise at Home  plans to walk 3x/week  Home  plans to walk 3x/week  Home  plans to walk 3x/week      Frequency Add 3 additional days to program exercise sessions. Add 3 additional days to program exercise sessions. Add 3 additional days to program exercise sessions.        Exercise Comments:      Exercise Comments      12/23/15 1736 01/11/16 1459 01/18/16 1631       Exercise Comments Reviewed METs and goals. Pt is tolerating exercise well; will continue to monitor exercise progression Pt workloads were decreased due to recurring CP per MD Reviewed METs and goals. Pt is tolerating exercise well; will continue to monitor exercise progression         Discharge Exercise Prescription (Final Exercise Prescription Changes):     Exercise Prescription Changes - 01/18/16 1600    Exercise Review   Progression Yes   Response to Exercise   Blood Pressure (Admit) 108/60 mmHg   Blood Pressure (Exercise) 130/80 mmHg   Blood Pressure (Exit) 104/60 mmHg   Heart Rate (Admit) 76 bpm   Heart Rate (Exercise) 98 bpm   Heart Rate (Exit) 73 bpm   Rating of Perceived Exertion (Exercise) 12   Comments reviewed HEP 12/21/15   Progression   Average METs 3.2   Resistance Training   Training Prescription Yes   Weight 5lbs   Reps 10-12   Bike   Level 1   decr. WL per MD, pt had cath. done on 6/28   Minutes 10   METs 2.82   NuStep   Level 4   Minutes 10   METs 2.9   Track   Laps 14   Minutes 10   METs 3.43  Home Exercise Plan   Plans to continue exercise at Home  plans to walk 3x/week    Frequency Add 3 additional days to program exercise sessions.      Nutrition:  Target Goals: Understanding of nutrition guidelines, daily intake of sodium 1500mg , cholesterol 200mg , calories 30% from fat and 7% or less from saturated fats, daily to have 5 or more servings of fruits and vegetables.  Biometrics:     Pre Biometrics - 12/08/15 1653    Pre Biometrics   Waist Circumference 47.75 inches   Hip Circumference 46.75 inches   Waist to Hip Ratio 1.02 %   Triceps Skinfold 18.5 mm   % Body Fat 33.4 %   Grip Strength 43.5 kg   Flexibility 0 in   Single Leg Stand 30 seconds       Nutrition Therapy Plan and Nutrition Goals:     Nutrition Therapy & Goals - 12/11/15 1010    Nutrition Therapy   Diet Therapeutic Lifestyle Changes   Personal Nutrition Goals   Personal Goal #1 0.5-2 lb wt loss per week to a goal wt loss of 6-24 lb at graduation from Cardiac Rehab.   Intervention Plan   Intervention Prescribe, educate and counsel regarding individualized specific dietary modifications aiming towards targeted core components such as weight,  hypertension, lipid management, diabetes, heart failure and other comorbidities.   Expected Outcomes Short Term Goal: Understand basic principles of dietary content, such as calories, fat, sodium, cholesterol and nutrients.;Long Term Goal: Adherence to prescribed nutrition plan.      Nutrition Discharge: Nutrition Scores:     Nutrition Assessments - 12/23/15 1500    MEDFICTS Scores   Pre Score 6      Nutrition Goals Re-Evaluation:   Psychosocial: Target Goals: Acknowledge presence or absence of depression, maximize coping skills, provide positive support system. Participant is able to verbalize types and ability to use techniques and skills needed for reducing stress and depression.  Initial Review & Psychosocial Screening:     Initial Psych Review & Screening - 12/15/15 1217    Initial Review   Current issues with Current Anxiety/Panic   Family Dynamics   Good Support System? Yes   Barriers   Psychosocial barriers to participate in program The patient should benefit from training in stress management and relaxation.   Screening Interventions   Interventions Encouraged to exercise      Quality of Life Scores:     Quality of Life - 12/08/15 1659    Quality of Life Scores   Health/Function Pre 24.57 %   Socioeconomic Pre 27.5 %   Psych/Spiritual Pre 28.21 %   Family Pre 27.6 %   GLOBAL Pre 26.33 %      PHQ-9:     Recent Review Flowsheet Data    Depression screen [facility] 2/9 12/15/2015   Decreased Interest 0   Down, Depressed, Hopeless 0   PHQ - 2 Score 0      Psychosocial Evaluation and Intervention:     Psychosocial Evaluation - 01/01/16 1356    Psychosocial Evaluation & Interventions   Interventions Stress management education;Relaxation education;Encouraged to exercise with the program and follow exercise prescription   Continued Psychosocial Services Needed Yes      Psychosocial Re-Evaluation:     Psychosocial Re-Evaluation      01/22/16 1444            Psychosocial Re-Evaluation   Interventions Stress management education;Relaxation education;Encouraged to attend Cardiac Rehabilitation for the exercise  Comments pt continues to exhibit health related anxiety although it is diminishing. pt is tolerating group exercise program without difficulty.         Continued Psychosocial Services Needed Yes          Vocational Rehabilitation: Provide vocational rehab assistance to qualifying candidates.   Vocational Rehab Evaluation & Intervention:     Vocational Rehab - 12/09/15 1008    Initial Vocational Rehab Evaluation & Intervention   Assessment shows need for Vocational Rehabilitation No      Education: Education Goals: Education classes will be provided on a weekly basis, covering required topics. Participant will state understanding/return demonstration of topics presented.  Learning Barriers/Preferences:     Learning Barriers/Preferences - 12/08/15 1651    Learning Barriers/Preferences   Learning Barriers None   Learning Preferences Written Material;Verbal Instruction;Skilled Demonstration;Video;Group Instruction;Individual Instruction      Education Topics: Count Your Pulse:  -Group instruction provided by verbal instruction, demonstration, patient participation and written materials to support subject.  Instructors address importance of being able to find your pulse and how to count your pulse when at home without a heart monitor.  Patients get hands on experience counting their pulse with staff help and individually.      CARDIAC REHAB PHASE II EXERCISE from 01/15/2016 in [facility] CARDIAC REHAB   Date  01/15/16   Instruction Review Code  2- meets goals/outcomes      Heart Attack, Angina, and Risk Factor Modification:  -Group instruction provided by verbal instruction, video, and written materials to support subject.  Instructors address signs and symptoms of angina and heart attacks.    Also  discuss risk factors for heart disease and how to make changes to improve heart health risk factors.   Functional Fitness:  -Group instruction provided by verbal instruction, demonstration, patient participation, and written materials to support subject.  Instructors address safety measures for doing things around the house.  Discuss how to get up and down off the floor, how to pick things up properly, how to safely get out of a chair without assistance, and balance training.   Meditation and Mindfulness:  -Group instruction provided by verbal instruction, patient participation, and written materials to support subject.  Instructor addresses importance of mindfulness and meditation practice to help reduce stress and improve awareness.  Instructor also leads participants through a meditation exercise.    Stretching for Flexibility and Mobility:  -Group instruction provided by verbal instruction, patient participation, and written materials to support subject.  Instructors lead participants through series of stretches that are designed to increase flexibility thus improving mobility.  These stretches are additional exercise for major muscle groups that are typically performed during regular warm up and cool down.   Hands Only CPR Anytime:  -Group instruction provided by verbal instruction, video, patient participation and written materials to support subject.  Instructors co-teach with AHA video for hands only CPR.  Participants get hands on experience with mannequins.   Nutrition I class: Heart Healthy Eating:  -Group instruction provided by PowerPoint slides, verbal discussion, and written materials to support subject matter. The instructor gives an explanation and review of the Therapeutic Lifestyle Changes diet recommendations, which includes a discussion on lipid goals, dietary fat, sodium, fiber, plant stanol/sterol esters, sugar, and the components of a well-balanced, healthy  diet.   Nutrition II class: Lifestyle Skills:  -Group instruction provided by PowerPoint slides, verbal discussion, and written materials to support subject matter. The instructor gives an explanation and review  of label reading, grocery shopping for heart health, heart healthy recipe modifications, and ways to make healthier choices when eating out.   Diabetes Question & Answer:  -Group instruction provided by PowerPoint slides, verbal discussion, and written materials to support subject matter. The instructor gives an explanation and review of diabetes co-morbidities, pre- and post-prandial blood glucose goals, pre-exercise blood glucose goals, signs, symptoms, and treatment of hypoglycemia and hyperglycemia, and foot care basics.   Diabetes Blitz:  -Group instruction provided by PowerPoint slides, verbal discussion, and written materials to support subject matter. The instructor gives an explanation and review of the physiology behind type 1 and type 2 diabetes, diabetes medications and rational behind using different medications, pre- and post-prandial blood glucose recommendations and Hemoglobin A1c goals, diabetes diet, and exercise including blood glucose guidelines for exercising safely.    Portion Distortion:  -Group instruction provided by PowerPoint slides, verbal discussion, written materials, and food models to support subject matter. The instructor gives an explanation of serving size versus portion size, changes in portions sizes over the last 20 years, and what consists of a serving from each food group.   Stress Management:  -Group instruction provided by verbal instruction, video, and written materials to support subject matter.  Instructors review role of stress in heart disease and how to cope with stress positively.            CARDIAC REHAB PHASE II EXERCISE from 01/15/2016 in [facility] CARDIAC REHAB   Date  12/16/15   Instruction Review Code  2- meets  goals/outcomes      Exercising on Your Own:  -Group instruction provided by verbal instruction, power point, and written materials to support subject.  Instructors discuss benefits of exercise, components of exercise, frequency and intensity of exercise, and end points for exercise.  Also discuss use of nitroglycerin and activating EMS.  Review options of places to exercise outside of rehab.  Review guidelines for sex with heart disease.   Cardiac Drugs I:  -Group instruction provided by verbal instruction and written materials to support subject.  Instructor reviews cardiac drug classes: antiplatelets, anticoagulants, beta blockers, and statins.  Instructor discusses reasons, side effects, and lifestyle considerations for each drug class.   Cardiac Drugs II:  -Group instruction provided by verbal instruction and written materials to support subject.  Instructor reviews cardiac drug classes: angiotensin converting enzyme inhibitors (ACE-I), angiotensin II receptor blockers (ARBs), nitrates, and calcium channel blockers.  Instructor discusses reasons, side effects, and lifestyle considerations for each drug class.   Anatomy and Physiology of the Circulatory System:  -Group instruction provided by verbal instruction, video, and written materials to support subject.  Reviews functional anatomy of heart, how it relates to various diagnoses, and what role the heart plays in the overall system.   Knowledge Questionnaire Score:     Knowledge Questionnaire Score - 12/08/15 1652    Knowledge Questionnaire Score   Pre Score 18/24      Core Components/Risk Factors/Patient Goals at Admission:     Personal Goals and Risk Factors at Admission - 12/08/15 1654    Core Components/Risk Factors/Patient Goals on Admission    Weight Management Yes;Weight Loss   Intervention Weight Management: Develop a combined nutrition and exercise program designed to reach desired caloric intake, while maintaining  appropriate intake of nutrient and fiber, sodium and fats, and appropriate energy expenditure required for the weight goal.;Weight Management: Provide education and appropriate resources to help participant work on and attain dietary goals.;Weight  Management/Obesity: Establish reasonable short term and long term weight goals.;Obesity: Provide education and appropriate resources to help participant work on and attain dietary goals.   Admit Weight 235 lb 14.3 oz (107 kg)   Goal Weight: Short Term 229 lb (103.874 kg)   Goal Weight: Long Term 200 lb (90.719 kg)   Expected Outcomes Short Term: Continue to assess and modify interventions until short term weight is achieved;Long Term: Adherence to nutrition and physical activity/exercise program aimed toward attainment of established weight goal;Weight Maintenance: Understanding of the daily nutrition guidelines, which includes 25-35% calories from fat, 7% or less cal from saturated fats, less than 200mg  cholesterol, less than 1.5gm of sodium, & 5 or more servings of fruits and vegetables daily;Weight Loss: Understanding of general recommendations for a balanced deficit meal plan, which promotes 1-2 lb weight loss per week and includes a negative energy balance of [phone] kcal/d;Understanding recommendations for meals to include 15-35% energy as protein, 25-35% energy from fat, 35-60% energy from carbohydrates, less than 200mg  of dietary cholesterol, 20-35 gm of total fiber daily;Understanding of distribution of calorie intake throughout the day with the consumption of 4-5 meals/snacks;Weight Gain: Understanding of general recommendations for a high calorie, high protein meal plan that promotes weight gain by distributing calorie intake throughout the day with the consumption for 4-5 meals, snacks, and/or supplements   Personal Goal Other Yes   Personal Goal Improve overall health, heart stronger. Be able to walk better.   Intervention Develop exercise program for  home in conjunction with cardiac rehab to improve cardiorespiratory fitness. Encourage attendance at education classes for risk factor modification.   Expected Outcomes Understand modifiable risk factors for heart disease. Adhere to safe exercise parameters, continue walking program at home in addition to exercise at cardiac rehab.      Core Components/Risk Factors/Patient Goals Review:      Goals and Risk Factor Review      12/24/15 1648 01/18/16 1409         Core Components/Risk Factors/Patient Goals Review   Personal Goals Review Other Other      Review pt has started walking at home for exercise 2x/week Able to walk longer and further without complications. Walking for HEP on T/TH/Sat      Expected Outcomes pt will continue with HEP with less fatigue and SOB pt will continue with HEP with less fatigue and SOB         Core Components/Risk Factors/Patient Goals at Discharge (Final Review):      Goals and Risk Factor Review - 01/18/16 1409    Core Components/Risk Factors/Patient Goals Review   Personal Goals Review Other   Review Able to walk longer and further without complications. Walking for HEP on T/TH/Sat   Expected Outcomes pt will continue with HEP with less fatigue and SOB      ITP Comments:     ITP Comments      12/08/15 1406           ITP Comments Dr. Armanda Magic, Medical Director           Comments: Pt is making expected progress toward personal goals after completing  17 sessions. Recommend continued exercise and life style modification education including  stress management and relaxation techniques to decrease cardiac risk profile. Uri is doing well with weight loss.

## 2016-01-25 ENCOUNTER — Encounter (HOSPITAL_COMMUNITY)
Admission: RE | Admit: 2016-01-25 | Discharge: 2016-01-25 | Disposition: A | Discharge status: Home / Self Care | Payer: BLUE CROSS/BLUE SHIELD | Source: Ambulatory Visit | Attending: Internal Medicine | Admitting: Internal Medicine

## 2016-01-25 DIAGNOSIS — Z955 Presence of coronary angioplasty implant and graft: Secondary | ICD-10-CM

## 2016-01-25 DIAGNOSIS — I214 Non-ST elevation (NSTEMI) myocardial infarction: Secondary | ICD-10-CM

## 2016-01-26 ENCOUNTER — Other Ambulatory Visit: Payer: Self-pay

## 2016-01-26 MED ORDER — CARVEDILOL 6.25 MG PO TABS: 6.2500 mg | ORAL_TABLET | Freq: Two times a day (BID) | ORAL | Status: DC

## 2016-01-26 MED ORDER — LISINOPRIL 10 MG PO TABS: 10.0000 mg | ORAL_TABLET | Freq: Every day | ORAL | Status: DC

## 2016-01-26 MED ORDER — TICAGRELOR 90 MG PO TABS: 90.0000 mg | ORAL_TABLET | Freq: Two times a day (BID) | ORAL | Status: DC

## 2016-01-26 MED ORDER — ATORVASTATIN CALCIUM 80 MG PO TABS: 80.0000 mg | ORAL_TABLET | Freq: Every day | ORAL | Status: DC

## 2016-01-27 ENCOUNTER — Encounter (HOSPITAL_COMMUNITY)
Admission: RE | Admit: 2016-01-27 | Discharge: 2016-01-27 | Disposition: A | Discharge status: Home / Self Care | Payer: BLUE CROSS/BLUE SHIELD | Source: Ambulatory Visit | Attending: Internal Medicine | Admitting: Internal Medicine

## 2016-01-27 ENCOUNTER — Encounter: Payer: Self-pay | Admitting: Internal Medicine

## 2016-01-27 ENCOUNTER — Ambulatory Visit (INDEPENDENT_AMBULATORY_CARE_PROVIDER_SITE_OTHER): Payer: BLUE CROSS/BLUE SHIELD | Admitting: Internal Medicine

## 2016-01-27 VITALS — BP 113/66 | HR 68 | Ht 71.0 in | Wt 223.2 lb

## 2016-01-27 DIAGNOSIS — I208 Other forms of angina pectoris: Secondary | ICD-10-CM | POA: Diagnosis not present

## 2016-01-27 DIAGNOSIS — I214 Non-ST elevation (NSTEMI) myocardial infarction: Secondary | ICD-10-CM

## 2016-01-27 DIAGNOSIS — Z955 Presence of coronary angioplasty implant and graft: Secondary | ICD-10-CM

## 2016-01-27 DIAGNOSIS — K5900 Constipation, unspecified: Secondary | ICD-10-CM

## 2016-01-27 DIAGNOSIS — E785 Hyperlipidemia, unspecified: Secondary | ICD-10-CM

## 2016-01-27 DIAGNOSIS — E669 Obesity, unspecified: Secondary | ICD-10-CM | POA: Diagnosis not present

## 2016-01-27 DIAGNOSIS — I10 Essential (primary) hypertension: Secondary | ICD-10-CM

## 2016-01-27 DIAGNOSIS — I2109 ST elevation (STEMI) myocardial infarction involving other coronary artery of anterior wall: Secondary | ICD-10-CM

## 2016-01-27 DIAGNOSIS — K649 Unspecified hemorrhoids: Secondary | ICD-10-CM

## 2016-01-27 MED ORDER — TICAGRELOR 90 MG PO TABS: 90.0000 mg | ORAL_TABLET | Freq: Two times a day (BID) | ORAL | Status: DC

## 2016-01-27 NOTE — Progress Notes (Signed)
Patient ID: Patrick Wilkins, male   DOB: [birth year], 60 y.o.   MRN: [id number]    Date:  01/27/2016   ID:  Patrick Wilkins, DOB July 14, 1955, MRN [id number]  PCP:  [name], MD  Primary Cardiologist:  [facility]  Chief Complaint  Patient presents with  . Follow-up    Pt states no Sx     History of Present Illness: Patrick Wilkins is a 60 y.o. male history of tobacco abuse but no known cardiac history presented 10/30/15 with chest pain. Reports starting around 6pm onset of 10/10 pressure in midchest with diaphoresis and SOB. Symptoms improved but not resolved with SL NG x3, also received ASA in the field and 6mg  of IV morphine. Pain down from 10/10 to 5/10 but not resolved. Brought in as code STEMI by EMS. Initial troponin of 0.11.   Cath with multivessel CAD. S/p PTCA and DES of total occlusion of mid LAD. Continued ASA/ brilinta/ statin/ lisinopril and coreg. Continue to have mild chest discomfort. Due to elevated BP, lisinopril dose was adjusted. The patient was given IV lasix for mild right basilar crackles and EF of 40-45% on cath. Echo showed improved EF to 50-55%, grade 2 DD, trace MR and TR. Dyspnea has resolved with lasix, felt he will not need lasix at home. Changed protonix back to omeprazole at discharge. The patient takes Celebrex for DDD, advised to avoid celebrex/ NSAIDs if able.  Patient presents for posthospital follow-up. He reports doing well. He is walking 15 minutes twice a day without any problems. He is taking Tylenol instead of Celebrex for his back. He has requested tramadol which helped in the hospital. He is taking all medications as prescribed. Did state that he was having some quivering in his heart/nervousness 1 day or so he took his clonazepam and that resolved it.   The patient currently denies nausea, vomiting, fever, chest pain, shortness of breath, orthopnea, dizziness, PND, cough, congestion, abdominal pain, hematochezia, melena, lower extremity edema,  claudication.  12/08/2015  Patrick Wilkins returns today for follow-up. He was seen after his recent acute MI by 2 physician assistants. The second office visit was for acute chest pain. There was concern as he had some moderate lesions on That were not revascularized that he may be having additional ischemia. He was admitted and ruled out for MI. He underwent nuclear stress testing which was negative for ischemia. He was then discharged. He says he has had no further chest pain symptoms. He is on low-dose imdur 15 mg daily. He's been compliant with aspirin and Brilinta and is exercising on his own. He says disease consider cardiac rehabilitation however it would cost him over $100 per session which is quite expensive. He may likely be able to do this exercise on his own. Today's asking if he could use Bactrim as needed for what sounds like recurrent abscesses. There is no issue that I have with it. He does get some dizziness with quick position changes. He denies any further chest pain.  01/06/2016  Patrick Wilkins returns today for follow-up. I received several messages from cardiac rehabilitation regarding ongoing chest pain symptoms. These have occurred with exercise, particularly cardiac rehabilitation and with sexual activity. He has stopped taking isosorbide due to headaches and intolerance to that medication and noted that it was not particularly helpful with chest pain. 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 In fact he was tearful in  the office today and says that he is considered switching careers although does not know what he would do at this late life.  01/27/2016  Patrick Wilkins was seen today in follow-up. He reports his chest pain is resolved. Unfortunately he's having problems with constipation and has recently had some mild rectal blood after bowel movements. He was determined to have hemorrhoids by his PCP and placed on Proctocort. The bleeding may be  more significant due to his antiplatelet therapy. He continues to have hard stools and would benefit from a stool softener and occasional use of relaxer constipation. He does take Metamucil.  Wt Readings from Last 3 Encounters:  01/27/16 223 lb 3.2 oz (101.243 kg)  01/06/16 228 lb (103.42 kg)  12/08/15 235 lb 14.3 oz (107 kg)     Past Medical History  Diagnosis Date  . Hypertension   . Anxiety   . CAD (coronary artery disease)     a. NSTEMI 10/30/15 s/p PTCA and DES of total occusion of  mid LAD. ostial and proximal LAD 50% stenosis, 50-70% PDA stenosis, and 70% stenosis of the first obtuse marginal.   . Hyperlipidemia   . NSTEMI (non-ST elevated myocardial infarction) (HCC) 10/2015  . Kidney stones   . Tuberculosis 1958  . Complication of anesthesia 1958    "died on the table when they did OR to dx TB"  . CHF (congestive heart failure) (HCC) 10/2015  . Childhood asthma   . GERD (gastroesophageal reflux disease)   . Arthritis     "right knee, back" (11/24/2015)    Current Outpatient Prescriptions  Medication Sig Dispense Refill  . allopurinol (ZYLOPRIM) 100 MG tablet Take 100 mg by mouth daily.    Marland Kitchen. aspirin 81 MG tablet Take 81 mg by mouth daily as needed for pain. For chest pain. Given by EMS    . atorvastatin (LIPITOR) 80 MG tablet Take 1 tablet (80 mg total) by mouth daily at 6 PM. 180 tablet 3  . carvedilol (COREG) 6.25 MG tablet Take 1 tablet (6.25 mg total) by mouth 2 (two) times daily with a meal. 180 tablet 3  . clonazePAM (KLONOPIN) 0.5 MG tablet Take 0.5 mg by mouth 2 (two) times daily as needed for anxiety.    . Coenzyme Q10 (CO Q-10) 300 MG CAPS Take 1 capsule by mouth daily.    . L-ARGININE PO Take 300 mg by mouth 3 (three) times daily.    Marland Kitchen. lisinopril (PRINIVIL,ZESTRIL) 10 MG tablet Take 1 tablet (10 mg total) by mouth daily. 180 tablet 3  . nitroGLYCERIN (NITROSTAT) 0.4 MG SL tablet Place 1 tablet (0.4 mg total) under the tongue every 5 (five) minutes as needed for  chest pain. Given by EMS 25 tablet 12  . omeprazole (PRILOSEC) 20 MG capsule Take 40 mg by mouth 2 (two) times daily before a meal.     . psyllium (METAMUCIL) 58.6 % packet Take 1 packet by mouth at bedtime.    . ranolazine (RANEXA) 1000 MG SR tablet Take 1 tablet (1,000 mg total) by mouth 2 (two) times daily. 60 tablet 6  . sulfamethoxazole-trimethoprim (BACTRIM DS,SEPTRA DS) 800-160 MG tablet Take 1 tablet by mouth 2 (two) times daily. Takes PRN for a few days when he feels a "boil" coming on - ok with cardiologist    . ticagrelor (BRILINTA) 90 MG TABS tablet Take 1 tablet (90 mg total) by mouth 2 (two) times daily. 180 tablet 3  . traMADol (ULTRAM) 50 MG tablet Take 50  mg by mouth every 6 (six) hours as needed.  0  . vitamin C (ASCORBIC ACID) 500 MG tablet Take 500 mg by mouth daily.     No current facility-administered medications for this visit.    Allergies:    Allergies  Allergen Reactions  . Other Anaphylaxis    [place] nuts: Stop breathing   . Tyloxapol Itching and Nausea And Vomiting    Itching and vomiting  Tolerates other forms of oxycodone    Social History:  The patient  reports that he has quit smoking. His smoking use included Cigarettes. He has a 5 pack-year smoking history. He has quit using smokeless tobacco. His smokeless tobacco use included Chew. He reports that he drinks alcohol. He reports that he does not use illicit drugs.   Family history:   Family History  Problem Relation Age of Onset  . Heart attack Paternal Uncle   . Hypertension Maternal Grandmother   . Hypertension Maternal Grandfather     ROS:  Please see the history of present illness.  All other systems reviewed and negative.   PHYSICAL EXAM: VS:  BP 113/66 mmHg  Pulse 68  Ht  (1.803 m)  Wt 223 lb 3.2 oz (101.243 kg)  BMI 31.14 kg/m2 Deferred  EKG:   Deferred  ASSESSMENT:  Problem List Items Addressed This Visit    None     PLAN:  Mr. Deruiter has had improvement in chest  pain now on Ranexa, L-arginine and CoQ10. Unfortunately there is constipation and I've recommended adding Colace to his regimen as well as intermittently using Miralax for hard stools. He is using Proctocort per his primary care provider. He noticed today that he did not have any blood in his stool for the first time. Hopefully this will resolve and if he keeps his stool soft and obese less likely to develop hemorrhoids. Unfortunately cannot discontinue his dual antiplatelet at this time. Continue current medicines and follow-up with me in 3 months.  Chrystie Nose, MD, [facility] Attending Cardiologist [facility]

## 2016-01-27 NOTE — Patient Instructions (Signed)
Dr. Rennis GoldenHilty recommends COLACE 100mg  (over the counter) - 1 capsule at night -- you can use miralax for constipation   Your physician recommends that you continue on your current medications as directed. Please refer to the Current Medication list given to you today.  Your physician wants you to follow-up in: THREE MONTHS with Dr. Rennis GoldenHilty. You will receive a reminder letter in the mail two months in advance. If you don't receive a letter, please call our office to schedule the follow-up appointment.

## 2016-01-29 ENCOUNTER — Encounter (HOSPITAL_COMMUNITY)
Admission: RE | Admit: 2016-01-29 | Discharge: 2016-01-29 | Disposition: A | Discharge status: Home / Self Care | Payer: BLUE CROSS/BLUE SHIELD | Source: Ambulatory Visit | Attending: Internal Medicine | Admitting: Internal Medicine

## 2016-01-29 DIAGNOSIS — I214 Non-ST elevation (NSTEMI) myocardial infarction: Secondary | ICD-10-CM | POA: Diagnosis not present

## 2016-01-29 DIAGNOSIS — Z955 Presence of coronary angioplasty implant and graft: Secondary | ICD-10-CM

## 2016-02-01 ENCOUNTER — Encounter (HOSPITAL_COMMUNITY)
Admission: RE | Admit: 2016-02-01 | Discharge: 2016-02-01 | Disposition: A | Discharge status: Home / Self Care | Payer: BLUE CROSS/BLUE SHIELD | Source: Ambulatory Visit | Attending: Internal Medicine | Admitting: Internal Medicine

## 2016-02-01 DIAGNOSIS — I214 Non-ST elevation (NSTEMI) myocardial infarction: Secondary | ICD-10-CM | POA: Diagnosis not present

## 2016-02-01 DIAGNOSIS — Z955 Presence of coronary angioplasty implant and graft: Secondary | ICD-10-CM

## 2016-02-03 ENCOUNTER — Encounter (HOSPITAL_COMMUNITY)
Admission: RE | Admit: 2016-02-03 | Discharge: 2016-02-03 | Disposition: A | Discharge status: Home / Self Care | Payer: BLUE CROSS/BLUE SHIELD | Source: Ambulatory Visit | Attending: Internal Medicine | Admitting: Internal Medicine

## 2016-02-03 DIAGNOSIS — Z955 Presence of coronary angioplasty implant and graft: Secondary | ICD-10-CM

## 2016-02-03 DIAGNOSIS — I214 Non-ST elevation (NSTEMI) myocardial infarction: Secondary | ICD-10-CM | POA: Diagnosis not present

## 2016-02-05 ENCOUNTER — Encounter (HOSPITAL_COMMUNITY)
Admission: RE | Admit: 2016-02-05 | Discharge: 2016-02-05 | Disposition: A | Discharge status: Home / Self Care | Payer: BLUE CROSS/BLUE SHIELD | Source: Ambulatory Visit | Attending: Internal Medicine | Admitting: Internal Medicine

## 2016-02-05 DIAGNOSIS — I214 Non-ST elevation (NSTEMI) myocardial infarction: Secondary | ICD-10-CM | POA: Diagnosis not present

## 2016-02-05 DIAGNOSIS — Z955 Presence of coronary angioplasty implant and graft: Secondary | ICD-10-CM

## 2016-02-08 ENCOUNTER — Encounter (HOSPITAL_COMMUNITY)
Admission: RE | Admit: 2016-02-08 | Discharge: 2016-02-08 | Disposition: A | Discharge status: Home / Self Care | Payer: BLUE CROSS/BLUE SHIELD | Source: Ambulatory Visit | Attending: Internal Medicine | Admitting: Internal Medicine

## 2016-02-08 DIAGNOSIS — I214 Non-ST elevation (NSTEMI) myocardial infarction: Secondary | ICD-10-CM

## 2016-02-08 DIAGNOSIS — Z955 Presence of coronary angioplasty implant and graft: Secondary | ICD-10-CM

## 2016-02-10 ENCOUNTER — Ambulatory Visit: Payer: BLUE CROSS/BLUE SHIELD | Admitting: Internal Medicine

## 2016-02-10 ENCOUNTER — Encounter (HOSPITAL_COMMUNITY)
Admission: RE | Admit: 2016-02-10 | Discharge: 2016-02-10 | Disposition: A | Discharge status: Home / Self Care | Payer: BLUE CROSS/BLUE SHIELD | Source: Ambulatory Visit | Attending: Internal Medicine | Admitting: Internal Medicine

## 2016-02-10 DIAGNOSIS — E785 Hyperlipidemia, unspecified: Secondary | ICD-10-CM | POA: Insufficient documentation

## 2016-02-10 DIAGNOSIS — Z87891 Personal history of nicotine dependence: Secondary | ICD-10-CM | POA: Diagnosis not present

## 2016-02-10 DIAGNOSIS — I110 Hypertensive heart disease with heart failure: Secondary | ICD-10-CM | POA: Diagnosis not present

## 2016-02-10 DIAGNOSIS — K219 Gastro-esophageal reflux disease without esophagitis: Secondary | ICD-10-CM | POA: Diagnosis not present

## 2016-02-10 DIAGNOSIS — Z7982 Long term (current) use of aspirin: Secondary | ICD-10-CM | POA: Insufficient documentation

## 2016-02-10 DIAGNOSIS — I214 Non-ST elevation (NSTEMI) myocardial infarction: Secondary | ICD-10-CM

## 2016-02-10 DIAGNOSIS — I509 Heart failure, unspecified: Secondary | ICD-10-CM | POA: Insufficient documentation

## 2016-02-10 DIAGNOSIS — Z955 Presence of coronary angioplasty implant and graft: Secondary | ICD-10-CM

## 2016-02-10 DIAGNOSIS — Z79899 Other long term (current) drug therapy: Secondary | ICD-10-CM | POA: Diagnosis not present

## 2016-02-10 DIAGNOSIS — I2510 Atherosclerotic heart disease of native coronary artery without angina pectoris: Secondary | ICD-10-CM | POA: Diagnosis not present

## 2016-02-12 ENCOUNTER — Encounter (HOSPITAL_COMMUNITY)
Admission: RE | Admit: 2016-02-12 | Discharge: 2016-02-12 | Disposition: A | Discharge status: Home / Self Care | Payer: BLUE CROSS/BLUE SHIELD | Source: Ambulatory Visit | Attending: Internal Medicine | Admitting: Internal Medicine

## 2016-02-12 DIAGNOSIS — I214 Non-ST elevation (NSTEMI) myocardial infarction: Secondary | ICD-10-CM | POA: Diagnosis not present

## 2016-02-12 DIAGNOSIS — Z955 Presence of coronary angioplasty implant and graft: Secondary | ICD-10-CM

## 2016-02-15 ENCOUNTER — Encounter (HOSPITAL_COMMUNITY)
Admission: RE | Admit: 2016-02-15 | Discharge: 2016-02-15 | Disposition: A | Discharge status: Home / Self Care | Payer: BLUE CROSS/BLUE SHIELD | Source: Ambulatory Visit | Attending: Internal Medicine | Admitting: Internal Medicine

## 2016-02-15 DIAGNOSIS — I214 Non-ST elevation (NSTEMI) myocardial infarction: Secondary | ICD-10-CM

## 2016-02-15 DIAGNOSIS — Z955 Presence of coronary angioplasty implant and graft: Secondary | ICD-10-CM

## 2016-02-17 ENCOUNTER — Telehealth: Payer: Self-pay | Admitting: Internal Medicine

## 2016-02-17 ENCOUNTER — Encounter (HOSPITAL_COMMUNITY)
Admission: RE | Admit: 2016-02-17 | Discharge: 2016-02-17 | Disposition: A | Discharge status: Home / Self Care | Payer: BLUE CROSS/BLUE SHIELD | Source: Ambulatory Visit | Attending: Internal Medicine | Admitting: Internal Medicine

## 2016-02-17 DIAGNOSIS — Z955 Presence of coronary angioplasty implant and graft: Secondary | ICD-10-CM

## 2016-02-17 DIAGNOSIS — I214 Non-ST elevation (NSTEMI) myocardial infarction: Secondary | ICD-10-CM | POA: Diagnosis not present

## 2016-02-17 NOTE — Telephone Encounter (Signed)
New message      FYI Pt [facility] him on potassium citrate ER 10meq tb.  He is taking this twice a day for a while. He want Dr Rennis GoldenHilty to know

## 2016-02-17 NOTE — Telephone Encounter (Signed)
Thanks - Dr H 

## 2016-02-17 NOTE — Telephone Encounter (Signed)
Returned patient call- patient reports urologist has added Potassium citrate ER tb BID to his medications and wanted to make MD Hilty aware.    Will add to medication list and make MD aware of change.

## 2016-02-19 ENCOUNTER — Encounter (HOSPITAL_COMMUNITY)
Admission: RE | Admit: 2016-02-19 | Discharge: 2016-02-19 | Disposition: A | Discharge status: Home / Self Care | Payer: BLUE CROSS/BLUE SHIELD | Source: Ambulatory Visit | Attending: Internal Medicine | Admitting: Internal Medicine

## 2016-02-19 DIAGNOSIS — Z955 Presence of coronary angioplasty implant and graft: Secondary | ICD-10-CM

## 2016-02-19 DIAGNOSIS — I214 Non-ST elevation (NSTEMI) myocardial infarction: Secondary | ICD-10-CM | POA: Diagnosis not present

## 2016-02-22 ENCOUNTER — Encounter (HOSPITAL_COMMUNITY)
Admission: RE | Admit: 2016-02-22 | Discharge: 2016-02-22 | Disposition: A | Discharge status: Home / Self Care | Payer: BLUE CROSS/BLUE SHIELD | Source: Ambulatory Visit | Attending: Internal Medicine | Admitting: Internal Medicine

## 2016-02-22 DIAGNOSIS — I214 Non-ST elevation (NSTEMI) myocardial infarction: Secondary | ICD-10-CM | POA: Diagnosis not present

## 2016-02-22 DIAGNOSIS — Z955 Presence of coronary angioplasty implant and graft: Secondary | ICD-10-CM

## 2016-02-23 ENCOUNTER — Encounter (HOSPITAL_COMMUNITY): Payer: Self-pay | Admitting: *Deleted

## 2016-02-24 ENCOUNTER — Encounter (HOSPITAL_COMMUNITY)
Admission: RE | Admit: 2016-02-24 | Discharge: 2016-02-24 | Disposition: A | Discharge status: Home / Self Care | Payer: BLUE CROSS/BLUE SHIELD | Source: Ambulatory Visit | Attending: Internal Medicine | Admitting: Internal Medicine

## 2016-02-24 DIAGNOSIS — I214 Non-ST elevation (NSTEMI) myocardial infarction: Secondary | ICD-10-CM

## 2016-02-24 DIAGNOSIS — Z955 Presence of coronary angioplasty implant and graft: Secondary | ICD-10-CM

## 2016-02-26 ENCOUNTER — Encounter (HOSPITAL_COMMUNITY)
Admission: RE | Admit: 2016-02-26 | Discharge: 2016-02-26 | Disposition: A | Discharge status: Home / Self Care | Payer: BLUE CROSS/BLUE SHIELD | Source: Ambulatory Visit | Attending: Internal Medicine | Admitting: Internal Medicine

## 2016-02-26 DIAGNOSIS — Z955 Presence of coronary angioplasty implant and graft: Secondary | ICD-10-CM

## 2016-02-26 DIAGNOSIS — I214 Non-ST elevation (NSTEMI) myocardial infarction: Secondary | ICD-10-CM

## 2016-02-26 NOTE — Progress Notes (Signed)
Pt arrived at cardiac rehab BP:  92/62. Pt asymptomatic. Pt reports laxative use last night for constipation not relieved with colace daily. Pt advised to increase PO water intake, fruits and vegetables.  In addition, try miralax 1 capful daily until stool pattern returns to normal.  Pt given gatorade.  Pt able to exercise without difficulty.  Understanding verbalized.

## 2016-02-26 NOTE — Progress Notes (Signed)
Cardiac Individual Treatment Plan  Patient Details  Name: Patrick Wilkins MRN: [id number] Date of Birth: 05-17-1956 Referring Provider:   Flowsheet Row CARDIAC REHAB PHASE II ORIENTATION from 12/08/2015 in [facility]  Referring Provider  Hilty, [place] MD      Initial Encounter Date:  Flowsheet Row CARDIAC REHAB PHASE II ORIENTATION from 12/08/2015 in [facility] CARDIAC REHAB  Date  12/08/15  Referring Provider  Hilty, [place] MD      Visit Diagnosis: No diagnosis found.  Patient's Home Medications on Admission:  Current Outpatient Prescriptions:  .  allopurinol (ZYLOPRIM) 100 MG tablet, Take 100 mg by mouth daily., Disp: , Rfl:  .  aspirin 81 MG tablet, Take 81 mg by mouth daily as needed for pain. For chest pain. Given by EMS, Disp: , Rfl:  .  atorvastatin (LIPITOR) 80 MG tablet, Take 1 tablet (80 mg total) by mouth daily at 6 PM., Disp: 180 tablet, Rfl: 3 .  carvedilol (COREG) 6.25 MG tablet, Take 1 tablet (6.25 mg total) by mouth 2 (two) times daily with a meal., Disp: 180 tablet, Rfl: 3 .  clonazePAM (KLONOPIN) 0.5 MG tablet, Take 0.5 mg by mouth 2 (two) times daily as needed for anxiety., Disp: , Rfl:  .  Coenzyme Q10 (CO Q-10) 300 MG CAPS, Take 1 capsule by mouth daily., Disp: , Rfl:  .  L-ARGININE PO, Take 300 mg by mouth 3 (three) times daily., Disp: , Rfl:  .  lisinopril (PRINIVIL,ZESTRIL) 10 MG tablet, Take 1 tablet (10 mg total) by mouth daily., Disp: 180 tablet, Rfl: 3 .  nitroGLYCERIN (NITROSTAT) 0.4 MG SL tablet, Place 1 tablet (0.4 mg total) under the tongue every 5 (five) minutes as needed for chest pain. Given by EMS, Disp: 25 tablet, Rfl: 12 .  omeprazole (PRILOSEC) 20 MG capsule, Take 40 mg by mouth 2 (two) times daily before a meal. , Disp: , Rfl:  .  potassium citrate (UROCIT-K) 10 MEQ (1080 MG) SR tablet, Take 1 tablet (10 mEq total) by mouth 2 (two) times daily., Disp: , Rfl:  .  psyllium (METAMUCIL) 58.6 % packet,  Take 1 packet by mouth at bedtime., Disp: , Rfl:  .  ranolazine (RANEXA) 1000 MG SR tablet, Take 1 tablet (1,000 mg total) by mouth 2 (two) times daily., Disp: 60 tablet, Rfl: 6 .  sulfamethoxazole-trimethoprim (BACTRIM DS,SEPTRA DS) 800-160 MG tablet, Take 1 tablet by mouth 2 (two) times daily. Takes PRN for a few days when he feels a "boil" coming on - ok with cardiologist, Disp: , Rfl:  .  ticagrelor (BRILINTA) 90 MG TABS tablet, Take 1 tablet (90 mg total) by mouth 2 (two) times daily., Disp: 180 tablet, Rfl: 3 .  traMADol (ULTRAM) 50 MG tablet, Take 50 mg by mouth every 6 (six) hours as needed., Disp: , Rfl: 0 .  vitamin C (ASCORBIC ACID) 500 MG tablet, Take 500 mg by mouth daily., Disp: , Rfl:   Past Medical History: Past Medical History:  Diagnosis Date  . Anxiety   . Arthritis    "right knee, back" (11/24/2015)  . CAD (coronary artery disease)    a. NSTEMI 10/30/15 s/p PTCA and DES of total occusion of  mid LAD. ostial and proximal LAD 50% stenosis, 50-70% PDA stenosis, and 70% stenosis of the first obtuse marginal.   . CHF (congestive heart failure) (HCC) 10/2015  . Childhood asthma   . Complication of anesthesia 1958   "died on the  table when they did OR to dx TB"  . GERD (gastroesophageal reflux disease)   . Hyperlipidemia   . Hypertension   . Kidney stones   . NSTEMI (non-ST elevated myocardial infarction) (HCC) 10/2015  . Tuberculosis 1958    Tobacco Use: History  Smoking Status  . Former Smoker  . Packs/day: 1.00  . Years: 5.00  . Types: Cigarettes  Smokeless Tobacco  . Former Neurosurgeon  . Types: Chew    Comment: "quit smoking cigarettes & chewing tobacco in my 20s"    Labs: Recent Review Flowsheet Data    Labs for ITP Cardiac and Pulmonary Rehab Latest Ref Rng & Units 11/01/2015   Cholestrol 0 - 200 mg/dL 409   LDLCALC 0 - 99 mg/dL 811(B)   HDL >14 mg/dL 78(G)   Trlycerides <956 mg/dL 213(Y)      Capillary Blood Glucose: No results found for:  GLUCAP   Exercise Target Goals:    Exercise Program Goal: Individual exercise prescription set with THRR, safety & activity barriers. Participant demonstrates ability to understand and report RPE using BORG scale, to self-measure pulse accurately, and to acknowledge the importance of the exercise prescription.  Exercise Prescription Goal: Starting with aerobic activity 30 plus minutes a day, 3 days per week for initial exercise prescription. Provide home exercise prescription and guidelines that participant acknowledges understanding prior to discharge.  Activity Barriers & Risk Stratification:     Activity Barriers & Cardiac Risk Stratification - 12/08/15 1651      Activity Barriers & Cardiac Risk Stratification   Activity Barriers Back Problems;Other (comment)   Comments Hx torn cartilage in right knee   Cardiac Risk Stratification High      6 Minute Walk:     6 Minute Walk    Row Name 12/08/15 1700         6 Minute Walk   Phase Initial     Distance 1549 feet     Walk Time 6 minutes     # of Rest Breaks 0     MPH 2.93     METS 3.49     RPE 11     VO2 Peak 12.22     Symptoms No     Resting HR 68 bpm     Resting BP 124/80     Max Ex. HR 90 bpm     Max Ex. BP 122/72     2 Minute Post BP 100/66        Initial Exercise Prescription:     Initial Exercise Prescription - 12/08/15 1700      Date of Initial Exercise RX and Referring Provider   Date 12/08/15   Referring Provider Hilty, [place] MD     Bike   Level 1.4   Minutes 10   METs 3.46     NuStep   Level 3   Minutes 10   METs 2.8     Track   Laps 11   Minutes 10   METs 2.92     Prescription Details   Frequency (times per week) 3   Duration Progress to 30 minutes of continuous aerobic without signs/symptoms of physical distress     Intensity   THRR 40-80% of Max Heartrate 64-129   Ratings of Perceived Exertion 11-13   Perceived Dyspnea 0-4     Progression   Progression Continue to progress  workloads to maintain intensity without signs/symptoms of physical distress.     Resistance Training   Training Prescription Yes  Weight 2-3lbs.   Reps 10-12      Perform Capillary Blood Glucose checks as needed.  Exercise Prescription Changes:     Exercise Prescription Changes    Row Name 12/23/15 1700 01/11/16 1400 01/18/16 1600 02/05/16 1500 02/17/16 1600     Exercise Review   Progression Yes Yes Yes Yes Yes     Response to Exercise   Blood Pressure (Admit) 122/72 108/70 108/60 106/72 108/70   Blood Pressure (Exercise) 142/72 134/64 130/80 126/70 142/70   Blood Pressure (Exit) 104/64 112/70 104/60 126/70 102/60   Heart Rate (Admit) 82 bpm 74 bpm 76 bpm 71 bpm 67 bpm   Heart Rate (Exercise) 108 bpm 101 bpm 98 bpm 104 bpm 107 bpm   Heart Rate (Exit) 82 bpm 77 bpm 73 bpm 75 bpm 78 bpm   Rating of Perceived Exertion (Exercise) 11 12 12 12 12    Comments reviewed HEP 12/21/15 reviewed HEP 12/21/15 reviewed HEP 12/21/15 reviewed HEP 12/21/15 reviewed HEP 12/21/15     Progression   Average METs 3.6 3 3.2 3.3 3.7     Resistance Training   Training Prescription Yes Yes Yes Yes Yes   Weight 3lbs 5lbs 5lbs 5lbs 5lbs   Reps 10-12 10-12 10-12 10-12 10-12     Bike   Level 1.4 1   decr. WL per MD, pt had cath. done on 6/28 1   decr. WL per MD, pt had cath. done on 6/28 1.2   decr. WL per MD, pt had cath. done on 6/28 1.2   decr. WL per MD, pt had cath. done on 6/28   Minutes 10 10 10 10 10    METs 3.46 2.82 2.82 3.26 3.26     NuStep   Level 3 4 4 4 4    Minutes 10 10 10 10 10    METs 2.8 3 2.9 2.7 2.7     Track   Laps 12 12 14 17 16    Minutes 10 10 10 10 10    METs 3.09 3.09 3.43 3.95 3.79     Home Exercise Plan   Plans to continue exercise at Home  plans to walk 3x/week  Home  plans to walk 3x/week  Home  plans to walk 3x/week  Home  plans to walk 3x/week  Home  plans to walk 3x/week    Frequency Add 3 additional days to program exercise sessions. Add 3 additional days to  program exercise sessions. Add 3 additional days to program exercise sessions. Add 3 additional days to program exercise sessions. Add 3 additional days to program exercise sessions.      Exercise Comments:     Exercise Comments    Row Name 12/23/15 1736 01/11/16 1459 01/18/16 1631 02/19/16 1515     Exercise Comments Reviewed METs and goals. Pt is tolerating exercise well; will continue to monitor exercise progression Pt workloads were decreased due to recurring CP per MD Reviewed METs and goals. Pt is tolerating exercise well; will continue to monitor exercise progression Reviewed METs and goals. Pt is tolerating exercise well; will continue to monitor exercise progression       Discharge Exercise Prescription (Final Exercise Prescription Changes):     Exercise Prescription Changes - 02/17/16 1600      Exercise Review   Progression Yes     Response to Exercise   Blood Pressure (Admit) 108/70   Blood Pressure (Exercise) 142/70   Blood Pressure (Exit) 102/60   Heart Rate (Admit) 67 bpm   Heart Rate (  Exercise) 107 bpm   Heart Rate (Exit) 78 bpm   Rating of Perceived Exertion (Exercise) 12   Comments reviewed HEP 12/21/15     Progression   Average METs 3.7     Resistance Training   Training Prescription Yes   Weight 5lbs   Reps 10-12     Bike   Level 1.2   decr. WL per MD, pt had cath. done on 6/28   Minutes 10   METs 3.26     NuStep   Level 4   Minutes 10   METs 2.7     Track   Laps 16   Minutes 10   METs 3.79     Home Exercise Plan   Plans to continue exercise at Home  plans to walk 3x/week    Frequency Add 3 additional days to program exercise sessions.      Nutrition:  Target Goals: Understanding of nutrition guidelines, daily intake of sodium 1500mg , cholesterol 200mg , calories 30% from fat and 7% or less from saturated fats, daily to have 5 or more servings of fruits and vegetables.  Biometrics:     Pre Biometrics - 12/08/15 1653      Pre  Biometrics   Waist Circumference 47.75 inches   Hip Circumference 46.75 inches   Waist to Hip Ratio 1.02 %   Triceps Skinfold 18.5 mm   % Body Fat 33.4 %   Grip Strength 43.5 kg   Flexibility 0 in   Single Leg Stand 30 seconds       Nutrition Therapy Plan and Nutrition Goals:     Nutrition Therapy & Goals - 12/11/15 1010      Nutrition Therapy   Diet Therapeutic Lifestyle Changes     Personal Nutrition Goals   Personal Goal #1 0.5-2 lb wt loss per week to a goal wt loss of 6-24 lb at graduation from Cardiac Rehab.     Intervention Plan   Intervention Prescribe, educate and counsel regarding individualized specific dietary modifications aiming towards targeted core components such as weight, hypertension, lipid management, diabetes, heart failure and other comorbidities.   Expected Outcomes Short Term Goal: Understand basic principles of dietary content, such as calories, fat, sodium, cholesterol and nutrients.;Long Term Goal: Adherence to prescribed nutrition plan.      Nutrition Discharge: Nutrition Scores:     Nutrition Assessments - 12/23/15 1500      MEDFICTS Scores   Pre Score 6      Nutrition Goals Re-Evaluation:   Psychosocial: Target Goals: Acknowledge presence or absence of depression, maximize coping skills, provide positive support system. Participant is able to verbalize types and ability to use techniques and skills needed for reducing stress and depression.  Initial Review & Psychosocial Screening:     Initial Psych Review & Screening - 12/15/15 1217      Initial Review   Current issues with Current Anxiety/Panic     Family Dynamics   Good Support System? Yes     Barriers   Psychosocial barriers to participate in program The patient should benefit from training in stress management and relaxation.     Screening Interventions   Interventions Encouraged to exercise      Quality of Life Scores:     Quality of Life - 12/08/15 1659       Quality of Life Scores   Health/Function Pre 24.57 %   Socioeconomic Pre 27.5 %   Psych/Spiritual Pre 28.21 %   Family Pre 27.6 %  GLOBAL Pre 26.33 %      PHQ-9: Recent Review Flowsheet Data    [facility]   Decreased Interest 0   Down, Depressed, Hopeless 0   PHQ - 2 Score 0      Psychosocial Evaluation and Intervention:     Psychosocial Evaluation - 01/01/16 1356      Psychosocial Evaluation & Interventions   Interventions Stress management education;Relaxation education;Encouraged to exercise with the program and follow exercise prescription   Continued Psychosocial Services Needed Yes      Psychosocial Re-Evaluation:     Psychosocial Re-Evaluation    Row Name 01/22/16 1444 02/17/16 1550           Psychosocial Re-Evaluation   Interventions Stress management education;Relaxation education;Encouraged to attend Cardiac Rehabilitation for the exercise Stress management education;Encouraged to attend Cardiac Rehabilitation for the exercise      Comments pt continues to exhibit health related anxiety although it is diminishing. pt is tolerating group exercise program without difficulty.   pt health related anxiety is decreasing. pt is able to tolerate more actiivity with less difficulty. pt has been attending stress management educaiton classes.  pt has less chest pain and is proud of his weight loss.        Continued Psychosocial Services Needed Yes Yes         Vocational Rehabilitation: Provide vocational rehab assistance to qualifying candidates.   Vocational Rehab Evaluation & Intervention:     Vocational Rehab - 12/09/15 1008      Initial Vocational Rehab Evaluation & Intervention   Assessment shows need for Vocational Rehabilitation No      Education: Education Goals: Education classes will be provided on a weekly basis, covering required topics. Participant will state understanding/return demonstration of topics  presented.  Learning Barriers/Preferences:     Learning Barriers/Preferences - 12/08/15 1651      Learning Barriers/Preferences   Learning Barriers None   Learning Preferences Written Material;Verbal Instruction;Skilled Demonstration;Video;Group Instruction;Individual Instruction      Education Topics: Count Your Pulse:  -Group instruction provided by verbal instruction, demonstration, patient participation and written materials to support subject.  Instructors address importance of being able to find your pulse and how to count your pulse when at home without a heart monitor.  Patients get hands on experience counting their pulse with staff help and individually. Flowsheet Row CARDIAC REHAB PHASE [facility]  Date  01/15/16  Instruction Review Code  2- meets goals/outcomes      Heart Attack, Angina, and Risk Factor Modification:  -Group instruction provided by verbal instruction, video, and written materials to support subject.  Instructors address signs and symptoms of angina and heart attacks.    Also discuss risk factors for heart disease and how to make changes to improve heart health risk factors.   Functional Fitness:  -Group instruction provided by verbal instruction, demonstration, patient participation, and written materials to support subject.  Instructors address safety measures for doing things around the house.  Discuss how to get up and down off the floor, how to pick things up properly, how to safely get out of a chair without assistance, and balance training.   Meditation and Mindfulness:  -Group instruction provided by verbal instruction, patient participation, and written materials to support subject.  Instructor addresses importance of mindfulness and meditation practice to help reduce stress and improve awareness.  Instructor also leads participants through a meditation exercise.  Stretching for  Flexibility and Mobility:  -Group instruction provided by verbal instruction, patient participation, and written materials to support subject.  Instructors lead participants through series of stretches that are designed to increase flexibility thus improving mobility.  These stretches are additional exercise for major muscle groups that are typically performed during regular warm up and cool down.   Hands Only CPR Anytime:  -Group instruction provided by verbal instruction, video, patient participation and written materials to support subject.  Instructors co-teach with AHA video for hands only CPR.  Participants get hands on experience with mannequins.   Nutrition I class: Heart Healthy Eating:  -Group instruction provided by PowerPoint slides, verbal discussion, and written materials to support subject matter. The instructor gives an explanation and review of the Therapeutic Lifestyle Changes diet recommendations, which includes a discussion on lipid goals, dietary fat, sodium, fiber, plant stanol/sterol esters, sugar, and the components of a well-balanced, healthy diet.   Nutrition II class: Lifestyle Skills:  -Group instruction provided by PowerPoint slides, verbal discussion, and written materials to support subject matter. The instructor gives an explanation and review of label reading, grocery shopping for heart health, heart healthy recipe modifications, and ways to make healthier choices when eating out.   Diabetes Question & Answer:  -Group instruction provided by PowerPoint slides, verbal discussion, and written materials to support subject matter. The instructor gives an explanation and review of diabetes co-morbidities, pre- and post-prandial blood glucose goals, pre-exercise blood glucose goals, signs, symptoms, and treatment of hypoglycemia and hyperglycemia, and foot care basics.   Diabetes Blitz:  -Group instruction provided by PowerPoint slides, verbal discussion, and written  materials to support subject matter. The instructor gives an explanation and review of the physiology behind type 1 and type 2 diabetes, diabetes medications and rational behind using different medications, pre- and post-prandial blood glucose recommendations and Hemoglobin A1c goals, diabetes diet, and exercise including blood glucose guidelines for exercising safely.    Portion Distortion:  -Group instruction provided by PowerPoint slides, verbal discussion, written materials, and food models to support subject matter. The instructor gives an explanation of serving size versus portion size, changes in portions sizes over the last 20 years, and what consists of a serving from each food group.   Stress Management:  -Group instruction provided by verbal instruction, video, and written materials to support subject matter.  Instructors review role of stress in heart disease and how to cope with stress positively.   Flowsheet Row CARDIAC REHAB PHASE II EXERCISE from 02/24/2016 in [facility] CARDIAC REHAB  Date  02/10/16  Instruction Review Code  2- meets goals/outcomes      Exercising on Your Own:  -Group instruction provided by verbal instruction, power point, and written materials to support subject.  Instructors discuss benefits of exercise, components of exercise, frequency and intensity of exercise, and end points for exercise.  Also discuss use of nitroglycerin and activating EMS.  Review options of places to exercise outside of rehab.  Review guidelines for sex with heart disease.   Cardiac Drugs I:  -Group instruction provided by verbal instruction and written materials to support subject.  Instructor reviews cardiac drug classes: antiplatelets, anticoagulants, beta blockers, and statins.  Instructor discusses reasons, side effects, and lifestyle considerations for each drug class. Flowsheet Row CARDIAC REHAB PHASE II EXERCISE from 02/24/2016 in [facility]  CARDIAC REHAB  Date  02/24/16  Educator  pharmD  Instruction Review Code  2- meets goals/outcomes  Cardiac Drugs II:  -Group instruction provided by verbal instruction and written materials to support subject.  Instructor reviews cardiac drug classes: angiotensin converting enzyme inhibitors (ACE-I), angiotensin II receptor blockers (ARBs), nitrates, and calcium channel blockers.  Instructor discusses reasons, side effects, and lifestyle considerations for each drug class.   Anatomy and Physiology of the Circulatory System:  -Group instruction provided by verbal instruction, video, and written materials to support subject.  Reviews functional anatomy of heart, how it relates to various diagnoses, and what role the heart plays in the overall system.   Knowledge Questionnaire Score:     Knowledge Questionnaire Score - 12/08/15 1652      Knowledge Questionnaire Score   Pre Score 18/24      Core Components/Risk Factors/Patient Goals at Admission:     Personal Goals and Risk Factors at Admission - 12/08/15 1654      Core Components/Risk Factors/Patient Goals on Admission    Weight Management Yes;Weight Loss   Intervention Weight Management: Develop a combined nutrition and exercise program designed to reach desired caloric intake, while maintaining appropriate intake of nutrient and fiber, sodium and fats, and appropriate energy expenditure required for the weight goal.;Weight Management: Provide education and appropriate resources to help participant work on and attain dietary goals.;Weight Management/Obesity: Establish reasonable short term and long term weight goals.;Obesity: Provide education and appropriate resources to help participant work on and attain dietary goals.   Admit Weight 235 lb 14.3 oz (107 kg)   Goal Weight: Short Term 229 lb (103.9 kg)   Goal Weight: Long Term 200 lb (90.7 kg)   Expected Outcomes Short Term: Continue to assess and modify interventions until  short term weight is achieved;Long Term: Adherence to nutrition and physical activity/exercise program aimed toward attainment of established weight goal;Weight Maintenance: Understanding of the daily nutrition guidelines, which includes 25-35% calories from fat, 7% or less cal from saturated fats, less than 200mg  cholesterol, less than 1.5gm of sodium, & 5 or more servings of fruits and vegetables daily;Weight Loss: Understanding of general recommendations for a balanced deficit meal plan, which promotes 1-2 lb weight loss per week and includes a negative energy balance of [phone] kcal/d;Understanding recommendations for meals to include 15-35% energy as protein, 25-35% energy from fat, 35-60% energy from carbohydrates, less than 200mg  of dietary cholesterol, 20-35 gm of total fiber daily;Understanding of distribution of calorie intake throughout the day with the consumption of 4-5 meals/snacks;Weight Gain: Understanding of general recommendations for a high calorie, high protein meal plan that promotes weight gain by distributing calorie intake throughout the day with the consumption for 4-5 meals, snacks, and/or supplements   Personal Goal Other Yes   Personal Goal Improve overall health, heart stronger. Be able to walk better.   Intervention Develop exercise program for home in conjunction with cardiac rehab to improve cardiorespiratory fitness. Encourage attendance at education classes for risk factor modification.   Expected Outcomes Understand modifiable risk factors for heart disease. Adhere to safe exercise parameters, continue walking program at home in addition to exercise at cardiac rehab.      Core Components/Risk Factors/Patient Goals Review:      Goals and Risk Factor Review    Row Name 12/24/15 1648 01/18/16 1409 02/17/16 1721         Core Components/Risk Factors/Patient Goals Review   Personal Goals Review Other Other Increase Strength and Stamina;Improve shortness of breath with  ADL's;Other;Weight Management/Obesity     Review pt has started walking at home for exercise 2x/week  Able to walk longer and further without complications. Walking for HEP on T/TH/Sat Able to walk longer and further without decreased CP and SOB. Less anxiety more personable. Satisfied with weightloss     Expected Outcomes pt will continue with HEP with less fatigue and SOB pt will continue with HEP with less fatigue and SOB Pt will continue with weightloss and tolerate exercise program without any complications        Core Components/Risk Factors/Patient Goals at Discharge (Final Review):      Goals and Risk Factor Review - 02/17/16 1721      Core Components/Risk Factors/Patient Goals Review   Personal Goals Review Increase Strength and Stamina;Improve shortness of breath with ADL's;Other;Weight Management/Obesity   Review Able to walk longer and further without decreased CP and SOB. Less anxiety more personable. Satisfied with weightloss   Expected Outcomes Pt will continue with weightloss and tolerate exercise program without any complications      ITP Comments:     ITP Comments    Row Name 12/08/15 1406           ITP Comments Dr. Armanda Magic, Medical Director           Comments: Pt is making expected progress toward personal goals after completing 31 sessions. Recommend continued exercise and life style modification education including  stress management and relaxation techniques to decrease cardiac risk profile.

## 2016-02-29 ENCOUNTER — Telehealth: Payer: Self-pay | Admitting: Internal Medicine

## 2016-02-29 ENCOUNTER — Encounter (HOSPITAL_COMMUNITY)
Admission: RE | Admit: 2016-02-29 | Discharge: 2016-02-29 | Disposition: A | Discharge status: Home / Self Care | Payer: BLUE CROSS/BLUE SHIELD | Source: Ambulatory Visit | Attending: Internal Medicine | Admitting: Internal Medicine

## 2016-02-29 DIAGNOSIS — Z955 Presence of coronary angioplasty implant and graft: Secondary | ICD-10-CM

## 2016-02-29 DIAGNOSIS — I214 Non-ST elevation (NSTEMI) myocardial infarction: Secondary | ICD-10-CM | POA: Diagnosis not present

## 2016-02-29 NOTE — Telephone Encounter (Signed)
New message    FYI     Pt states that on this past Fri & Sat the he was exhausted w/no desire to eat. He slept in on Saturday from 3pm to 10pm. He got up and went right back to sleep. He was experiencing SOB. Please call.

## 2016-02-29 NOTE — Telephone Encounter (Signed)
Left msg & communicated physician advice.

## 2016-02-29 NOTE — Telephone Encounter (Signed)
He may want to see his PCP if his symptoms don't improve by the middle of this week.  DR. HRexene Edison

## 2016-02-29 NOTE — Telephone Encounter (Signed)
Pt notes "I had no appetite", skipped breakfast, was out in heat on friday.   Saturday, no appetite, out in the heat in the AM, 1.5 hrs. Felt tired after coming in, slept Sat afternoon.  No chest pain, had some mild SOB. Regarding this symptom, he felt like he wasn't getting a good amt of air in, but notes it was hot and humid. This only occurred outside, and he felt better once indoors.   States when checked at home, his BPs and HRs are normal 120s/80s, HR 70s. - he checks these daily in AM.  Notes he is enrolled in cardiac rehab and going today.  we discussed that he may want to let them know about events over the weekend, and they can do EKG, BP and HR check and see if anything of concern. From available information, advised the patient to stay well hydrated, eat at regular times.  Will defer to Dr. Rennis GoldenHilty for additional advice.  also wants to know if any labwork recommended prior to his upcoming October appt.

## 2016-02-29 NOTE — Telephone Encounter (Signed)
Left msg for patient to call. 

## 2016-03-02 ENCOUNTER — Encounter (HOSPITAL_COMMUNITY)
Admission: RE | Admit: 2016-03-02 | Discharge: 2016-03-02 | Disposition: A | Discharge status: Home / Self Care | Payer: BLUE CROSS/BLUE SHIELD | Source: Ambulatory Visit | Attending: Internal Medicine | Admitting: Internal Medicine

## 2016-03-02 VITALS — Wt 213.2 lb

## 2016-03-02 DIAGNOSIS — Z955 Presence of coronary angioplasty implant and graft: Secondary | ICD-10-CM

## 2016-03-02 DIAGNOSIS — I214 Non-ST elevation (NSTEMI) myocardial infarction: Secondary | ICD-10-CM

## 2016-03-04 ENCOUNTER — Encounter (HOSPITAL_COMMUNITY)
Admission: RE | Admit: 2016-03-04 | Discharge: 2016-03-04 | Disposition: A | Discharge status: Home / Self Care | Payer: BLUE CROSS/BLUE SHIELD | Source: Ambulatory Visit | Attending: Internal Medicine | Admitting: Internal Medicine

## 2016-03-04 ENCOUNTER — Ambulatory Visit (HOSPITAL_COMMUNITY): Payer: Self-pay | Admitting: Cardiac Rehabilitation

## 2016-03-04 DIAGNOSIS — I214 Non-ST elevation (NSTEMI) myocardial infarction: Secondary | ICD-10-CM | POA: Diagnosis not present

## 2016-03-07 ENCOUNTER — Encounter (HOSPITAL_COMMUNITY): Payer: BLUE CROSS/BLUE SHIELD

## 2016-03-07 ENCOUNTER — Encounter (HOSPITAL_COMMUNITY): Payer: Self-pay

## 2016-03-09 ENCOUNTER — Encounter (HOSPITAL_COMMUNITY): Payer: BLUE CROSS/BLUE SHIELD

## 2016-03-09 ENCOUNTER — Ambulatory Visit: Payer: BLUE CROSS/BLUE SHIELD | Admitting: Internal Medicine

## 2016-03-11 ENCOUNTER — Encounter (HOSPITAL_COMMUNITY): Payer: BLUE CROSS/BLUE SHIELD

## 2016-03-16 ENCOUNTER — Encounter (HOSPITAL_COMMUNITY): Payer: BLUE CROSS/BLUE SHIELD

## 2016-03-18 ENCOUNTER — Encounter (HOSPITAL_COMMUNITY): Payer: BLUE CROSS/BLUE SHIELD

## 2016-03-30 ENCOUNTER — Other Ambulatory Visit: Payer: Self-pay | Admitting: Internal Medicine

## 2016-03-30 MED ORDER — RANOLAZINE ER 1000 MG PO TB12: 1000.0000 mg | ORAL_TABLET | Freq: Two times a day (BID) | ORAL | 3 refills | Status: DC

## 2016-03-30 NOTE — Telephone Encounter (Signed)
E-SENT TO MAIL ORDER PATIENT AWARE

## 2016-03-30 NOTE — Telephone Encounter (Signed)
New message     Pt calling about med issues.  Pt c/o medication issue:  1. Name of Medication: Ranexa 1000 mg  2. How are you currently taking this medication (dosage and times per day)?  2 a day  3. Are you having a reaction (difficulty breathing--STAT)? No   4. What is your medication issue? Wants to have this med added to the express script due to cost

## 2016-04-01 NOTE — Addendum Note (Signed)
Encounter addended by: Guilherme Schwenke D Darielys Giglia on: 04/01/2016  4:44 PM<BR>    Actions taken: Flowsheet data copied forward, Flowsheet accepted, Visit Navigator Flowsheet section accepted

## 2016-04-04 NOTE — Addendum Note (Signed)
Encounter addended by: Jacques EarthlyEdna Brewbaker Jaire Pinkham, RD on: 04/04/2016  8:54 AM<BR>    Actions taken: Flowsheet data copied forward, Visit Navigator Flowsheet section accepted

## 2016-04-14 ENCOUNTER — Ambulatory Visit (INDEPENDENT_AMBULATORY_CARE_PROVIDER_SITE_OTHER): Payer: BLUE CROSS/BLUE SHIELD | Admitting: Internal Medicine

## 2016-04-14 ENCOUNTER — Encounter: Payer: Self-pay | Admitting: Internal Medicine

## 2016-04-14 VITALS — BP 115/63 | HR 63 | Ht 71.0 in | Wt 208.6 lb

## 2016-04-14 DIAGNOSIS — I10 Essential (primary) hypertension: Secondary | ICD-10-CM

## 2016-04-14 DIAGNOSIS — I2109 ST elevation (STEMI) myocardial infarction involving other coronary artery of anterior wall: Secondary | ICD-10-CM | POA: Diagnosis not present

## 2016-04-14 DIAGNOSIS — I214 Non-ST elevation (NSTEMI) myocardial infarction: Secondary | ICD-10-CM

## 2016-04-14 DIAGNOSIS — I2583 Coronary atherosclerosis due to lipid rich plaque: Secondary | ICD-10-CM

## 2016-04-14 DIAGNOSIS — I2510 Atherosclerotic heart disease of native coronary artery without angina pectoris: Secondary | ICD-10-CM | POA: Diagnosis not present

## 2016-04-14 MED ORDER — RANOLAZINE ER 1000 MG PO TB12: 1000.0000 mg | ORAL_TABLET | Freq: Every day | ORAL | 3 refills | Status: DC

## 2016-04-14 MED ORDER — LISINOPRIL 10 MG PO TABS: 5.0000 mg | ORAL_TABLET | Freq: Every day | ORAL | 3 refills | Status: DC

## 2016-04-14 NOTE — Patient Instructions (Signed)
Medication Instructions:  DECREASE Lisinopril to 5mg  take 1 tab by mouth daily WEAN DOWN on Ranexa Take 1 tab by mouth for 7 days then STOP if experiencing NO CHEST PAIN  Labwork: None   Testing/Procedures: Your physician has requested that you have an echocardiogram. Echocardiography is a painless test that uses sound waves to create images of your heart. It provides your doctor with information about the size and shape of your heart and how well your heart's chambers and valves are working. This procedure takes approximately one hour. There are no restrictions for this procedure.   Follow-Up: Your physician wants you to follow-up in: 6 months with Dr Rennis GoldenHilty.  You will receive a reminder letter in the mail two months in advance. If you don't receive a letter, please call our office to schedule the follow-up appointment.  Any Other Special Instructions Will Be Listed Below (If Applicable).    If you need a refill on your cardiac medications before your next appointment, please call your pharmacy.

## 2016-04-14 NOTE — Progress Notes (Signed)
Patient ID: Patrick Wilkins, male   DOB: Jun 03, 1956, 60 y.o.   MRN: [id number]    Date:  04/14/2016   ID:  Patrick Wilkins, DOB Jun 03, 1956, MRN [id number]  PCP:  Patrick Wilkins  Primary Cardiologist:  Patrick Wilkins  Chief Complaint  Patient presents with  . Follow-up    3 month; dizziness when getting up too fast.      History of Present Illness: Patrick CockayneRicky D Wilkins is a 60 y.o. male history of tobacco abuse but no known cardiac history presented 10/30/15 with chest pain. Reports starting around 6pm onset of 10/10 pressure in midchest with diaphoresis and SOB. Symptoms improved but not resolved with SL NG x3, also received ASA in the field and 6mg  of IV morphine. Pain down from 10/10 to 5/10 but not resolved. Brought in as code STEMI by EMS. Initial troponin of 0.11.   Cath with multivessel CAD. S/p PTCA and DES of total occlusion of mid LAD. Continued ASA/ brilinta/ statin/ lisinopril and coreg. Continue to have mild chest discomfort. Due to elevated BP, lisinopril dose was adjusted. The patient was given IV lasix for mild right basilar crackles and EF of 40-45% on cath. Echo showed improved EF to 50-55%, grade 2 DD, trace MR and TR. Dyspnea has resolved with lasix, felt he will not need lasix at home. Changed protonix back to omeprazole at discharge. The patient takes Celebrex for DDD, advised to avoid celebrex/ NSAIDs if able.  Patient presents for posthospital follow-up. He reports doing well. He is walking 15 minutes twice a day without any problems. He is taking Tylenol instead of Celebrex for his back. He has requested tramadol which helped in the hospital. He is taking all medications as prescribed. Did state that he was having some quivering in his heart/nervousness 1 day or so he took his clonazepam and that resolved it.   The patient currently denies nausea, vomiting, fever, chest pain, shortness of breath, orthopnea, dizziness, PND, cough, congestion, abdominal pain, hematochezia, melena,  lower extremity edema, claudication.  12/08/2015  Patrick Wilkins returns today for follow-up. He was seen after his recent acute MI by 2 physician assistants. The second office visit was for acute chest pain. There was concern as he had some moderate lesions on That were not revascularized that he may be having additional ischemia. He was admitted and ruled out for MI. He underwent nuclear stress testing which was negative for ischemia. He was then discharged. He says he has had no further chest pain symptoms. He is on low-dose imdur 15 mg daily. He's been compliant with aspirin and Brilinta and is exercising on his own. He says disease consider cardiac rehabilitation however it would cost him over $100 per session which is quite expensive. He may likely be able to do this exercise on his own. Today's asking if he could use Bactrim as needed for what sounds like recurrent abscesses. There is no issue that I have with it. He does get some dizziness with quick position changes. He denies any further chest pain.  01/06/2016  Patrick Wilkins returns today for follow-up. I received several messages from cardiac rehabilitation regarding ongoing chest pain symptoms. These have occurred with exercise, particularly cardiac rehabilitation and with sexual activity. He has stopped taking isosorbide due to headaches and intolerance to that medication and noted that it was not particularly helpful with chest pain. 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 In  fact he was tearful in the office today and says that he is considered switching careers although does not know what he would do at this late life.  01/27/2016  Patrick Wilkins was seen today in follow-up. He reports his chest pain is resolved. Unfortunately he's having problems with constipation and has recently had some mild rectal blood after bowel movements. He was determined to have hemorrhoids by his PCP and placed on Proctocort.  The bleeding may be more significant due to his antiplatelet therapy. He continues to have hard stools and would benefit from a stool softener and occasional use of relaxer constipation. He does take Metamucil.  04/14/2016  Patrick Wilkins returns today for follow-up. He is feeling much better.He is managed to lose a significant amount of was down to 200  At one point he was up to 245 pounds. He's exercising regularly and denies any chest pain. He is occasionally having dizziness. I have reviewed his home blood pressure list which was extensive and indicated that he at times has blood pressures around the 100 systolic or even in the 90s. Given his weight loss I feel that he is currently being over treated with medication. He is interested in a repeat echocardiogram to see if his LV function has normalized after anterior MI. EF was low at 50-55%. He had recent lab work from his primary care provider which showed normal renal function and a cholesterol of 91, triglycerides 96, HDL 39 and LDL 33. This represents excellent cholesterol control. He's also concerned about the cost of ranolazine, a medicine are not sure that he needs to stay on long-term. He reports his constipation and hemorrhoidal bleeding has resolved.  Wt Readings from Last 3 Encounters:  04/14/16 208 lb 9.6 oz (94.6 kg)  04/01/16 213 lb 3 oz (96.7 kg)  01/27/16 223 lb 3.2 oz (101.2 kg)     Past Medical History:  Diagnosis Date  . Anxiety   . Arthritis    "right knee, back" (11/24/2015)  . CAD (coronary artery disease)    a. NSTEMI 10/30/15 s/p PTCA and DES of total occusion of  mid LAD. ostial and proximal LAD 50% stenosis, 50-70% PDA stenosis, and 70% stenosis of the first obtuse marginal.   . CHF (congestive heart failure) (HCC) 10/2015  . Childhood asthma   . Complication of anesthesia 1958   "died on the table when they did OR to dx TB"  . GERD (gastroesophageal reflux disease)   . Hyperlipidemia   . Hypertension   . Kidney stones    . NSTEMI (non-ST elevated myocardial infarction) (HCC) 10/2015  . Tuberculosis 1958    Current Outpatient Prescriptions  Medication Sig Dispense Refill  . allopurinol (ZYLOPRIM) 100 MG tablet Take 100 mg by mouth daily.    Marland Kitchen aspirin 81 MG tablet Take 81 mg by mouth daily as needed for pain. For chest pain. Given by EMS    . atorvastatin (LIPITOR) 80 MG tablet Take 1 tablet (80 mg total) by mouth daily at 6 PM. 180 tablet 3  . carvedilol (COREG) 6.25 MG tablet Take 1 tablet (6.25 mg total) by mouth 2 (two) times daily with a meal. 180 tablet 3  . clonazePAM (KLONOPIN) 0.5 MG tablet Take 0.5 mg by mouth 2 (two) times daily as needed for anxiety.    . Coenzyme Q10 (CO Q-10) 300 MG CAPS Take 1 capsule by mouth daily.    . L-ARGININE PO Take 300 mg by mouth 3 (three) times daily.    Marland Kitchen  lisinopril (PRINIVIL,ZESTRIL) 10 MG tablet Take 1 tablet (10 mg total) by mouth daily. 180 tablet 3  . nitroGLYCERIN (NITROSTAT) 0.4 MG SL tablet Place 1 tablet (0.4 mg total) under the tongue every 5 (five) minutes as needed for chest pain. Given by EMS 25 tablet 12  . omeprazole (PRILOSEC) 20 MG capsule Take 40 mg by mouth 2 (two) times daily before a meal.     . potassium citrate (UROCIT-K) 10 MEQ (1080 MG) SR tablet Take 1 tablet (10 mEq total) by mouth 2 (two) times daily.    . psyllium (METAMUCIL) 58.6 % packet Take 1 packet by mouth at bedtime.    . ranolazine (RANEXA) 1000 MG SR tablet Take 1 tablet (1,000 mg total) by mouth 2 (two) times daily. 180 tablet 3  . sulfamethoxazole-trimethoprim (BACTRIM DS,SEPTRA DS) 800-160 MG tablet Take 1 tablet by mouth 2 (two) times daily. Takes PRN for a few days when he feels a "boil" coming on - ok with cardiologist    . ticagrelor (BRILINTA) 90 MG TABS tablet Take 1 tablet (90 mg total) by mouth 2 (two) times daily. 180 tablet 3  . traMADol (ULTRAM) 50 MG tablet Take 50 mg by mouth every 6 (six) hours as needed.  0  . vitamin C (ASCORBIC ACID) 500 MG tablet Take 500 mg by  mouth daily.     No current facility-administered medications for this visit.     Allergies:    Allergies  Allergen Reactions  . Other Anaphylaxis    [place] nuts: Stop breathing   . Tyloxapol Itching and Nausea And Vomiting    Itching and vomiting  Tolerates other forms of oxycodone    Social History:  The patient  reports that he has quit smoking. His smoking use included Cigarettes. He has a 5.00 pack-year smoking history. He has quit using smokeless tobacco. His smokeless tobacco use included Chew. He reports that he drinks alcohol. He reports that he does not use drugs.   Family history:   Family History  Problem Relation Age of Onset  . Hypertension Maternal Grandmother   . Hypertension Maternal Grandfather   . Heart attack Paternal Uncle     ROS:  Please see the history of present illness.  All other systems reviewed and negative.   PHYSICAL EXAM: VS:  BP 115/63   Pulse 63   Ht 5\' 11"  (1.803 m)   Wt 208 lb 9.6 oz (94.6 kg)   BMI 29.09 kg/m  General appearance: alert and no distress Lungs: clear to auscultation bilaterally Heart: regular rate and rhythm, S1, S2 normal, no murmur, click, rub or gallop Extremities: extremities normal, atraumatic, no cyanosis or edema Neurologic: Grossly normal  EKG:   Normal sinus rhythm at 63  ASSESSMENT: 1. CAD status post anterior NSTEMI - DES to the mid-LAD with a 2.75 mm x 18 mm DES 2. Ischemic cardiomyopathy - EF 40-45% by cath (at 50% by echo) 3. Hypertension 4. Dyslipidemia 5. Obesity with recent weight loss  PLAN:  1. Mr. Bartoli Reports improvement of his chest pain and is not require taking any more nitroglycerin. He's exercising now daily and has had significant weight loss. He gets some positional dizziness which is likely related to low normal blood pressures given his weight loss. I recommend decreasing his lisinopril to 5 mg daily. Cholesterol is excellent at this point. We'll continue his current medicine. He is  advised to wean down his ranolazine, starting with 1000 mg daily for one week if he  has no further chest discomfort he can discontinue it. He will need to remain on Brilinta until April 2018. We will recheck an echocardiogram if he has regained normal LV function.  Follow-up in 6 months.  Chrystie Nose, Wilkins, [facility] Attending Cardiologist [facility]

## 2016-04-26 ENCOUNTER — Ambulatory Visit (HOSPITAL_COMMUNITY): Payer: BLUE CROSS/BLUE SHIELD | Attending: Cardiology

## 2016-04-26 ENCOUNTER — Other Ambulatory Visit: Payer: Self-pay

## 2016-04-26 ENCOUNTER — Ambulatory Visit: Payer: BLUE CROSS/BLUE SHIELD | Admitting: Internal Medicine

## 2016-04-26 DIAGNOSIS — I2510 Atherosclerotic heart disease of native coronary artery without angina pectoris: Secondary | ICD-10-CM | POA: Diagnosis not present

## 2016-04-26 DIAGNOSIS — I214 Non-ST elevation (NSTEMI) myocardial infarction: Secondary | ICD-10-CM | POA: Diagnosis not present

## 2016-04-26 DIAGNOSIS — I2583 Coronary atherosclerosis due to lipid rich plaque: Secondary | ICD-10-CM | POA: Insufficient documentation

## 2016-04-26 DIAGNOSIS — I081 Rheumatic disorders of both mitral and tricuspid valves: Secondary | ICD-10-CM | POA: Diagnosis not present

## 2016-04-26 DIAGNOSIS — I2109 ST elevation (STEMI) myocardial infarction involving other coronary artery of anterior wall: Secondary | ICD-10-CM | POA: Insufficient documentation

## 2016-05-02 ENCOUNTER — Telehealth: Payer: Self-pay | Admitting: Internal Medicine

## 2016-05-02 NOTE — Telephone Encounter (Signed)
Patient calling the office for samples of medication:   1.  What medication and dosage are you requesting samples for?Brilinta   2.  Are you currently out of this medication?  Only have 2 left .

## 2016-05-02 NOTE — Telephone Encounter (Signed)
Returned call and informed wife samples available at front desk for pickup.

## 2016-05-14 ENCOUNTER — Other Ambulatory Visit: Payer: Self-pay | Admitting: Physician Assistant

## 2016-05-16 NOTE — Telephone Encounter (Signed)
Rx request sent to pharmacy.  

## 2016-05-31 NOTE — Progress Notes (Signed)
Cardiac Individual Treatment Plan  Patient Details  Name: Patrick Wilkins MRN: [id number] Date of Birth: 1956/01/05 Referring Provider:   Flowsheet Row CARDIAC REHAB PHASE II ORIENTATION from 12/08/2015 in [facility]  Referring Provider  Hilty, [place] MD      Initial Encounter Date:  [place] from 12/08/2015 in [facility]  Date  12/08/15  Referring Provider  Hilty, [place] MD      Visit Diagnosis: No diagnosis found.  Patient's Home Medications on Admission:  Current Outpatient Prescriptions:  .  allopurinol (ZYLOPRIM) 100 MG tablet, Take 100 mg by mouth daily., Disp: , Rfl:  .  aspirin 81 MG tablet, Take 81 mg by mouth daily as needed for pain. For chest pain. Given by EMS, Disp: , Rfl:  .  atorvastatin (LIPITOR) 80 MG tablet, TAKE 1 TABLET (80 MG TOTAL) BY MOUTH DAILY AT 6 PM., Disp: 30 tablet, Rfl: 6 .  carvedilol (COREG) 6.25 MG tablet, Take 1 tablet (6.25 mg total) by mouth 2 (two) times daily with a meal., Disp: 180 tablet, Rfl: 3 .  clonazePAM (KLONOPIN) 0.5 MG tablet, Take 0.5 mg by mouth 2 (two) times daily as needed for anxiety., Disp: , Rfl:  .  Coenzyme Q10 (CO Q-10) 300 MG CAPS, Take 1 capsule by mouth daily., Disp: , Rfl:  .  L-ARGININE PO, Take 300 mg by mouth 3 (three) times daily., Disp: , Rfl:  .  lisinopril (PRINIVIL,ZESTRIL) 10 MG tablet, Take 0.5 tablets (5 mg total) by mouth daily., Disp: 180 tablet, Rfl: 3 .  nitroGLYCERIN (NITROSTAT) 0.4 MG SL tablet, Place 1 tablet (0.4 mg total) under the tongue every 5 (five) minutes as needed for chest pain. Given by EMS, Disp: 25 tablet, Rfl: 12 .  omeprazole (PRILOSEC) 20 MG capsule, Take 40 mg by mouth 2 (two) times daily before a meal. , Disp: , Rfl:  .  potassium citrate (UROCIT-K) 10 MEQ (1080 MG) SR tablet, Take 1 tablet (10 mEq total) by mouth 2 (two) times daily., Disp: , Rfl:  .  psyllium (METAMUCIL) 58.6 % packet,  Take 1 packet by mouth at bedtime., Disp: , Rfl:  .  ranolazine (RANEXA) 1000 MG SR tablet, Take 1 tablet (1,000 mg total) by mouth daily. Take 1 tab by mouth for 7 days then stop if no chest pain is current, Disp: 180 tablet, Rfl: 3 .  sulfamethoxazole-trimethoprim (BACTRIM DS,SEPTRA DS) 800-160 MG tablet, Take 1 tablet by mouth 2 (two) times daily. Takes PRN for a few days when he feels a "boil" coming on - ok with cardiologist, Disp: , Rfl:  .  ticagrelor (BRILINTA) 90 MG TABS tablet, Take 1 tablet (90 mg total) by mouth 2 (two) times daily., Disp: 180 tablet, Rfl: 3 .  traMADol (ULTRAM) 50 MG tablet, Take 50 mg by mouth every 6 (six) hours as needed., Disp: , Rfl: 0 .  vitamin C (ASCORBIC ACID) 500 MG tablet, Take 500 mg by mouth daily., Disp: , Rfl:   Past Medical History: Past Medical History:  Diagnosis Date  . Anxiety   . Arthritis    "right knee, back" (11/24/2015)  . CAD (coronary artery disease)    a. NSTEMI 10/30/15 s/p PTCA and DES of total occusion of  mid LAD. ostial and proximal LAD 50% stenosis, 50-70% PDA stenosis, and 70% stenosis of the first obtuse marginal.   . CHF (congestive heart failure) ([place]) 10/2015  . Childhood  asthma   . Complication of anesthesia 1958   "died on the table when they did OR to dx TB"  . GERD (gastroesophageal reflux disease)   . Hyperlipidemia   . Hypertension   . Kidney stones   . NSTEMI (non-ST elevated myocardial infarction) ([place]) 10/2015  . Tuberculosis 1958    Tobacco Use: History  Smoking Status  . Former Smoker  . Packs/day: 1.00  . Years: 5.00  . Types: Cigarettes  Smokeless Tobacco  . Former Systems developer  . Types: Chew    Comment: "quit smoking cigarettes & chewing tobacco in my 89s"    Labs: Recent Review Flowsheet Data    Labs for ITP Cardiac and Pulmonary Rehab Latest Ref Rng & Units 11/01/2015   Cholestrol 0 - 200 mg/dL 171   LDLCALC 0 - 99 mg/dL 100(H)   HDL >40 mg/dL 36(L)   Trlycerides <150 mg/dL 173(H)      Capillary  Blood Glucose: No results found for: GLUCAP   Exercise Target Goals:    Exercise Program Goal: Individual exercise prescription set with THRR, safety & activity barriers. Participant demonstrates ability to understand and report RPE using BORG scale, to self-measure pulse accurately, and to acknowledge the importance of the exercise prescription.  Exercise Prescription Goal: Starting with aerobic activity 30 plus minutes a day, 3 days per week for initial exercise prescription. Provide home exercise prescription and guidelines that participant acknowledges understanding prior to discharge.  Activity Barriers & Risk Stratification:   6 Minute Walk:     6 Minute Walk    Row Name 04/01/16 1633         6 Minute Walk   Phase Discharge     Distance 2149 feet     Distance % Change 38.73 %     Walk Time 6 minutes     # of Rest Breaks 0     MPH 4.1     METS 5.3     RPE 13     VO2 Peak 18.6     Symptoms No     Resting HR 65 bpm     Resting BP 104/60     Max Ex. HR 121 bpm     Max Ex. BP 160/60     2 Minute Post BP 92/60  rchk 95/60        Initial Exercise Prescription:   Perform Capillary Blood Glucose checks as needed.  Exercise Prescription Changes:     Exercise Prescription Changes    Row Name 01/11/16 1400 01/18/16 1600 02/05/16 1500 02/17/16 1600 03/03/16 1300     Exercise Review   Progression Yes Yes Yes Yes Yes     Response to Exercise   Blood Pressure (Admit) 108/70 108/60 106/72 108/70 116/60   Blood Pressure (Exercise) 134/64 130/80 126/70 142/70 114/58   Blood Pressure (Exit) 112/70 104/60 126/70 102/60 104/64   Heart Rate (Admit) 74 bpm 76 bpm 71 bpm 67 bpm 67 bpm   Heart Rate (Exercise) 101 bpm 98 bpm 104 bpm 107 bpm 104 bpm   Heart Rate (Exit) 77 bpm 73 bpm 75 bpm 78 bpm 67 bpm   Rating of Perceived Exertion (Exercise) 12 12 12 12 12    Comments reviewed HEP 12/21/15 reviewed HEP 12/21/15 reviewed HEP 12/21/15 reviewed HEP 12/21/15 reviewed HEP 12/21/15      Progression   Average METs 3 3.2 3.3 3.7 3.8     Resistance Training   Training Prescription Yes Yes Yes Yes Yes   Weight  5lbs 5lbs 5lbs 5lbs 5lbs   Reps 10-12 10-12 10-12 10-12 10-12     Bike   Level 1   decr. WL per MD, pt had cath. done on 6/28 1   decr. WL per MD, pt had cath. done on 6/28 1.2   decr. WL per MD, pt had cath. done on 6/28 1.2   decr. WL per MD, pt had cath. done on 6/28 1.2   decr. WL per MD, pt had cath. done on 6/28   Minutes 10 10 10 10 10    METs 2.82 2.82 3.26 3.26 3.26     NuStep   Level 4 4 4 4 4    Minutes 10 10 10 10 10    METs 3 2.9 2.7 2.7 3.7     Track   Laps 12 14 17 16 17    Minutes 10 10 10 10 10    METs 3.09 3.43 3.95 3.79 3.95     Home Exercise Plan   Plans to continue exercise at Home  plans to walk 3x/week  Home  plans to walk 3x/week  Home  plans to walk 3x/week  Home  plans to walk 3x/week  Home  plans to walk 3x/week    Frequency Add 3 additional days to program exercise sessions. Add 3 additional days to program exercise sessions. Add 3 additional days to program exercise sessions. Add 3 additional days to program exercise sessions. Add 3 additional days to program exercise sessions.   [place] Name 03/04/16 1635             Exercise Review   Progression Yes         Response to Exercise   Blood Pressure (Admit) 96/66       Blood Pressure (Exercise) 118/70       Blood Pressure (Exit) 92/60  95/60       Heart Rate (Admit) 68 bpm       Heart Rate (Exercise) 111 bpm       Heart Rate (Exit) 70 bpm       Rating of Perceived Exertion (Exercise) 12       Comments reviewed HEP 12/21/15       Duration Progress to 30 minutes of continuous aerobic without signs/symptoms of physical distress       Intensity THRR unchanged         Progression   Progression Continue to progress workloads to maintain intensity without signs/symptoms of physical distress.       Average METs 4.3         Resistance Training   Training Prescription Yes        Weight 5lbs       Reps 10-12         Bike   Level 1.2       Minutes 10       METs 3.26         NuStep   Level 4       Minutes 10       METs 4.4         Track   Laps 18       Minutes 10       METs 4.14         Home Exercise Plan   Plans to continue exercise at Home  plans to walk 3x/week       Frequency Add 3 additional days to program exercise sessions.          Exercise Comments:  Exercise Comments    Row Name 01/11/16 1459 01/18/16 1631 02/19/16 1515 03/03/16 1301 04/01/16 1641   Exercise Comments Pt workloads were decreased due to recurring CP per MD Reviewed METs and goals. Pt is tolerating exercise well; will continue to monitor exercise progression Reviewed METs and goals. Pt is tolerating exercise well; will continue to monitor exercise progression Reviewed METs. Pt is tolerating exercise well; will continue to monitor exercise progression Pt has completed 36 sessions of cardiac rehab. Pt plans to walk at home for exercise. Reminded pt of temperature and warning signs/emergency precautions.      Discharge Exercise Prescription (Final Exercise Prescription Changes):     Exercise Prescription Changes - 03/04/16 1635      Exercise Review   Progression Yes     Response to Exercise   Blood Pressure (Admit) 96/66   Blood Pressure (Exercise) 118/70   Blood Pressure (Exit) 92/60  95/60   Heart Rate (Admit) 68 bpm   Heart Rate (Exercise) 111 bpm   Heart Rate (Exit) 70 bpm   Rating of Perceived Exertion (Exercise) 12   Comments reviewed HEP 12/21/15   Duration Progress to 30 minutes of continuous aerobic without signs/symptoms of physical distress   Intensity THRR unchanged     Progression   Progression Continue to progress workloads to maintain intensity without signs/symptoms of physical distress.   Average METs 4.3     Resistance Training   Training Prescription Yes   Weight 5lbs   Reps 10-12     Bike   Level 1.2   Minutes 10   METs 3.26      NuStep   Level 4   Minutes 10   METs 4.4     Track   Laps 18   Minutes 10   METs 4.14     Home Exercise Plan   Plans to continue exercise at Home  plans to walk 3x/week   Frequency Add 3 additional days to program exercise sessions.      Nutrition:  Target Goals: Understanding of nutrition guidelines, daily intake of sodium <1554m, cholesterol <2058m calories 30% from fat and 7% or less from saturated fats, daily to have 5 or more servings of fruits and vegetables.  Biometrics:      Post Biometrics - 04/01/16 1642       Post  Biometrics   Weight 213 lb 3 oz (96.7 kg)   Waist Circumference 40.5 inches   Hip Circumference 45 inches   Waist to Hip Ratio 0.9 %   Triceps Skinfold 18 mm   % Body Fat 28.6 %   Grip Strength 53 kg   Flexibility 0 in   Single Leg Stand 30 seconds      Nutrition Therapy Plan and Nutrition Goals:   Nutrition Discharge: Nutrition Scores:     Nutrition Assessments - 03/16/16 1405      MEDFICTS Scores   Pre Score 6   Post Score 32   Score Difference 26      Nutrition Goals Re-Evaluation:     Nutrition Goals Re-Evaluation    Row Name 03/16/16 1406 04/04/16 0845           Personal Goal #1 Re-Evaluation   Personal Goal #1 0.5-2 lb wt loss per week to a goal wt loss of 6-24 lb at [facility]-2 lb wt loss per week to a goal wt loss of 6-24 lb at [facility]     Goal Progress Seen  Met Met      Comments Pt wt is down 11.8 lb from admission to most recent wt documented in EMR of 01/27/16 223.2 lb Pt wt is down 22.7 lb from admission        Weight   Current Weight 223 lb 3.2 oz (101.2 kg) 212 lb 11.2 oz (96.5 kg)         Psychosocial: Target Goals: Acknowledge presence or absence of depression, maximize coping skills, provide positive support system. Participant is able to verbalize types and ability to use techniques and skills needed for reducing stress and depression.  Initial  Review & Psychosocial Screening:   Quality of Life Scores:     Quality of Life - 04/01/16 1638      Quality of Life Scores   Health/Function Post 23.2 %   Socioeconomic Post 19.71 %   Psych/Spiritual Post 30 %   Family Post 18 %   GLOBAL Post 23.12 %      PHQ-9: Recent Review Flowsheet Data    Depression screen [facility] 2/9 03/07/2016 12/15/2015   Decreased Interest 0 0   Down, Depressed, Hopeless 0 0   PHQ - 2 Score 0 0      Psychosocial Evaluation and Intervention:     Psychosocial Evaluation - 03/07/16 1723      Discharge Psychosocial Assessment & Intervention   Discharge Continue support measures as needed   Comments pt instructed of need continued stress management and relaxation techniques. pt advised to reach out to counselor if current strategies ineffective.      Psychosocial Re-Evaluation:     Psychosocial Re-Evaluation    [place] Name 01/22/16 1444 02/17/16 1550           Psychosocial Re-Evaluation   Interventions Stress management education;Relaxation education;Encouraged to attend Cardiac Rehabilitation for the exercise Stress management education;Encouraged to attend Cardiac Rehabilitation for the exercise      Comments pt continues to exhibit health related anxiety although it is diminishing. pt is tolerating group exercise program without difficulty.   pt health related anxiety is decreasing. pt is able to tolerate more actiivity with less difficulty. pt has been attending stress management educaiton classes.  pt has less chest pain and is proud of his weight loss.        Continued Psychosocial Services Needed Yes Yes         Vocational Rehabilitation: Provide vocational rehab assistance to qualifying candidates.   Vocational Rehab Evaluation & Intervention:   Education: Education Goals: Education classes will be provided on a weekly basis, covering required topics. Participant will state understanding/return demonstration of topics  presented.  Learning Barriers/Preferences:   Education Topics: Count Your Pulse:  -Group instruction provided by verbal instruction, demonstration, patient participation and written materials to support subject.  Instructors address importance of being able to find your pulse and how to count your pulse when at home without a heart monitor.  Patients get hands on experience counting their pulse with staff help and individually. Flowsheet Row CARDIAC REHAB PHASE II EXERCISE from 03/02/2016 in [facility]  Date  01/15/16  Instruction Review Code  2- meets goals/outcomes      Heart Attack, Angina, and Risk Factor Modification:  -Group instruction provided by verbal instruction, video, and written materials to support subject.  Instructors address signs and symptoms of angina and heart attacks.    Also discuss risk factors for heart disease and how to make changes to improve heart health risk factors. Flowsheet  Row CARDIAC REHAB PHASE II EXERCISE from 03/02/2016 in [facility]  Date  03/02/16  Educator  RN  Instruction Review Code  2- meets goals/outcomes      Functional Fitness:  -Group instruction provided by verbal instruction, demonstration, patient participation, and written materials to support subject.  Instructors address safety measures for doing things around the house.  Discuss how to get up and down off the floor, how to pick things up properly, how to safely get out of a chair without assistance, and balance training. Flowsheet Row CARDIAC REHAB PHASE II EXERCISE from 03/02/2016 in [facility]  Date  02/26/16  Instruction Review Code  2- meets goals/outcomes      Meditation and Mindfulness:  -Group instruction provided by verbal instruction, patient participation, and written materials to support subject.  Instructor addresses importance of mindfulness and meditation practice to help  reduce stress and improve awareness.  Instructor also leads participants through a meditation exercise.    Stretching for Flexibility and Mobility:  -Group instruction provided by verbal instruction, patient participation, and written materials to support subject.  Instructors lead participants through series of stretches that are designed to increase flexibility thus improving mobility.  These stretches are additional exercise for major muscle groups that are typically performed during regular warm up and cool down.   Hands Only CPR Anytime:  -Group instruction provided by verbal instruction, video, patient participation and written materials to support subject.  Instructors co-teach with AHA video for hands only CPR.  Participants get hands on experience with mannequins.   Nutrition I class: Heart Healthy Eating:  -Group instruction provided by PowerPoint slides, verbal discussion, and written materials to support subject matter. The instructor gives an explanation and review of the Therapeutic Lifestyle Changes diet recommendations, which includes a discussion on lipid goals, dietary fat, sodium, fiber, plant stanol/sterol esters, sugar, and the components of a well-balanced, healthy diet.   Nutrition II class: Lifestyle Skills:  -Group instruction provided by PowerPoint slides, verbal discussion, and written materials to support subject matter. The instructor gives an explanation and review of label reading, grocery shopping for heart health, heart healthy recipe modifications, and ways to make healthier choices when eating out.   Diabetes Question & Answer:  -Group instruction provided by PowerPoint slides, verbal discussion, and written materials to support subject matter. The instructor gives an explanation and review of diabetes co-morbidities, pre- and post-prandial blood glucose goals, pre-exercise blood glucose goals, signs, symptoms, and treatment of hypoglycemia and hyperglycemia,  and foot care basics.   Diabetes Blitz:  -Group instruction provided by PowerPoint slides, verbal discussion, and written materials to support subject matter. The instructor gives an explanation and review of the physiology behind type 1 and type 2 diabetes, diabetes medications and rational behind using different medications, pre- and post-prandial blood glucose recommendations and Hemoglobin A1c goals, diabetes diet, and exercise including blood glucose guidelines for exercising safely.    Portion Distortion:  -Group instruction provided by PowerPoint slides, verbal discussion, written materials, and food models to support subject matter. The instructor gives an explanation of serving size versus portion size, changes in portions sizes over the last 20 years, and what consists of a serving from each food group.   Stress Management:  -Group instruction provided by verbal instruction, video, and written materials to support subject matter.  Instructors review role of stress in heart disease and how to cope with stress positively.   [place]  PHASE II EXERCISE from 03/02/2016 in [facility]  Date  02/10/16  Instruction Review Code  2- meets goals/outcomes      Exercising on Your Own:  -Group instruction provided by verbal instruction, power point, and written materials to support subject.  Instructors discuss benefits of exercise, components of exercise, frequency and intensity of exercise, and end points for exercise.  Also discuss use of nitroglycerin and activating EMS.  Review options of places to exercise outside of rehab.  Review guidelines for sex with heart disease.   Cardiac Drugs I:  -Group instruction provided by verbal instruction and written materials to support subject.  Instructor reviews cardiac drug classes: antiplatelets, anticoagulants, beta blockers, and statins.  Instructor discusses reasons, side effects, and lifestyle  considerations for each drug class. Flowsheet Row CARDIAC REHAB PHASE II EXERCISE from 03/02/2016 in [facility]  Date  02/24/16  Educator  pharmD  Instruction Review Code  2- meets goals/outcomes      Cardiac Drugs II:  -Group instruction provided by verbal instruction and written materials to support subject.  Instructor reviews cardiac drug classes: angiotensin converting enzyme inhibitors (ACE-I), angiotensin II receptor blockers (ARBs), nitrates, and calcium channel blockers.  Instructor discusses reasons, side effects, and lifestyle considerations for each drug class.   Anatomy and Physiology of the Circulatory System:  -Group instruction provided by verbal instruction, video, and written materials to support subject.  Reviews functional anatomy of heart, how it relates to various diagnoses, and what role the heart plays in the overall system.   Knowledge Questionnaire Score:     Knowledge Questionnaire Score - 03/03/16 1704      Knowledge Questionnaire Score   Post Score 23/24      Core Components/Risk Factors/Patient Goals at Admission:   Core Components/Risk Factors/Patient Goals Review:      Goals and Risk Factor Review    Row Name 01/18/16 1409 02/17/16 1721           Core Components/Risk Factors/Patient Goals Review   Personal Goals Review Other Increase Strength and Stamina;Improve shortness of breath with ADL's;Other;Weight Management/Obesity      Review Able to walk longer and further without complications. Walking for HEP on T/TH/Sat Able to walk longer and further without decreased CP and SOB. Less anxiety more personable. Satisfied with weightloss      Expected Outcomes pt will continue with HEP with less fatigue and SOB Pt will continue with weightloss and tolerate exercise program without any complications         Core Components/Risk Factors/Patient Goals at Discharge (Final Review):      Goals and Risk Factor Review -  02/17/16 1721      Core Components/Risk Factors/Patient Goals Review   Personal Goals Review Increase Strength and Stamina;Improve shortness of breath with ADL's;Other;Weight Management/Obesity   Review Able to walk longer and further without decreased CP and SOB. Less anxiety more personable. Satisfied with weightloss   Expected Outcomes Pt will continue with weightloss and tolerate exercise program without any complications      ITP Comments:   Comments: Pt graduated from cardiac rehab program today with completion of 36 exercise sessions in Phase II. Pt maintained good attendance and progressed nicely during his participation in rehab as evidenced by increased MET level.   Medication list reconciled.  Pt has made significant lifestyle changes and should be commended for his success. Pt feels he has achieved his goals during cardiac rehab, which include weight  loss and increased endurance. Pt plans to continue exercising on his own.

## 2016-08-15 ENCOUNTER — Telehealth: Payer: Self-pay | Admitting: Internal Medicine

## 2016-08-15 NOTE — Telephone Encounter (Signed)
New message  Pt call requesting to speak with RN. Pt states he was to call and inform Dr. Rennis GoldenHilty of new medications he is on. Pt states he has stared taking Flexeril an potassium. Pt did not have the mg for medication but states will have when called back. Please call back to discuss

## 2016-08-15 NOTE — Telephone Encounter (Signed)
Patient to call back

## 2016-08-16 NOTE — Telephone Encounter (Signed)
Spoke w patient. Unsure of mg dosing (he's not at home currently), informed him when he calls back he can speak w me or triage RN and clarify dosing for potassium and flexeril.

## 2016-08-17 NOTE — Telephone Encounter (Signed)
msg left for patient to call. 

## 2016-08-17 NOTE — Telephone Encounter (Signed)
Patient should avoid using clonazepam, tramadol and fexeeril at the same time. Please try to separate dose by 2-3 hours if all medication needed.   Flexeril may have some interaction with potassium tablet formulation  (GI irritation)  . If flexeril needed for > 5 days. Please consider changing potassium formulation to liquid form

## 2016-08-17 NOTE — Telephone Encounter (Signed)
Patient med list updated. Note we already had potassium at reported dosage in his med list.  He is also taking flexeril. Dosage and frequency of use entered. Pt does not need a call back unless any concern for medication interactions.

## 2016-08-19 NOTE — Telephone Encounter (Signed)
OK to leave detailed msg. Have done so to communicate recommendations, and recommended patient call if questions.

## 2016-08-25 ENCOUNTER — Telehealth: Payer: Self-pay | Admitting: Internal Medicine

## 2016-08-25 NOTE — Telephone Encounter (Signed)
New message    Pt c/o medication issue:  1. Name of Medication: mucinex D  2. How are you currently taking this medication (dosage and times per day)?   3. Are you having a reaction (difficulty breathing--STAT)? no  4. What is your medication issue? Pt wanting to make sure it is ok to take with his other medications.

## 2016-08-25 NOTE — Telephone Encounter (Signed)
OK to leave detailed msg per DPR.  Advised to avoid this medication as it is known to cause increase in BP, HR.  If questions call back.

## 2016-08-29 ENCOUNTER — Telehealth: Payer: Self-pay | Admitting: Internal Medicine

## 2016-08-29 NOTE — Telephone Encounter (Signed)
New message    Pt calling c/o feeling a tingly sensation from his neck to the top of his shoulder. He states there is no other symptoms. Pt states it has happened about 30 times in the past month. He states its beginning to happen more frequent now. He was calling to talk to the nurse because he's not sure if it's from his medication.

## 2016-08-29 NOTE — Telephone Encounter (Signed)
Sound neuromuscular - agree with PCP work-up.  Dr. HRexene Edison

## 2016-08-29 NOTE — Telephone Encounter (Signed)
Patient of Dr. Rennis GoldenHilty  Voices occasional pain/tingling sensation in back of neck, left side, radiating to shoulder.  Occurs ~1 time daily, occurred 2 times today and he was concerned.  Voices that it lasts typically 1-2 mins.  Denies SOB, dizziness, lightheadedness, nausea, CP, etc. Usually occurs when he is seated. "doesn't cause great concern", but patient requested advice. I suggested PCP eval may be beneficial for nerve, MSK concern, he's aware I'll see if Dr. Rennis GoldenHilty has any recommendations and will return his call.  Pt voiced acknowledgment and thanks.

## 2016-08-29 NOTE — Telephone Encounter (Signed)
Left msg for pt communicating recommendations.

## 2016-10-05 ENCOUNTER — Other Ambulatory Visit: Payer: Self-pay

## 2016-10-05 MED ORDER — ATORVASTATIN CALCIUM 80 MG PO TABS: 80.0000 mg | ORAL_TABLET | Freq: Every day | ORAL | 0 refills | Status: DC

## 2016-10-17 ENCOUNTER — Ambulatory Visit (INDEPENDENT_AMBULATORY_CARE_PROVIDER_SITE_OTHER): Payer: BLUE CROSS/BLUE SHIELD | Admitting: Internal Medicine

## 2016-10-17 ENCOUNTER — Encounter: Payer: Self-pay | Admitting: Internal Medicine

## 2016-10-17 VITALS — BP 114/64 | HR 53 | Ht 71.0 in | Wt 215.0 lb

## 2016-10-17 DIAGNOSIS — I255 Ischemic cardiomyopathy: Secondary | ICD-10-CM

## 2016-10-17 DIAGNOSIS — I2510 Atherosclerotic heart disease of native coronary artery without angina pectoris: Secondary | ICD-10-CM

## 2016-10-17 DIAGNOSIS — E782 Mixed hyperlipidemia: Secondary | ICD-10-CM | POA: Diagnosis not present

## 2016-10-17 DIAGNOSIS — I10 Essential (primary) hypertension: Secondary | ICD-10-CM | POA: Diagnosis not present

## 2016-10-17 NOTE — Progress Notes (Signed)
Patient ID: Patrick Wilkins, male   DOB: 1955/12/17, 61 y.o.   MRN: [id number]    Date:  10/17/2016   ID:  Patrick Wilkins, DOB [birth year], MRN [id number]  PCP:  [name], MD  Primary Cardiologist:  [facility]  Chief Complaint  Patient presents with  . Chest Pain    a little      History of Present Illness: Patrick Wilkins is a 61 y.o. male history of tobacco abuse but no known cardiac history presented 10/30/15 with chest pain. Reports starting around 6pm onset of 10/10 pressure in midchest with diaphoresis and SOB. Symptoms improved but not resolved with SL NG x3, also received ASA in the field and  of IV morphine. Pain down from 10/10 to 5/10 but not resolved. Brought in as code STEMI by EMS. Initial troponin of 0.11.   Cath with multivessel CAD. S/p PTCA and DES of total occlusion of mid LAD. Continued ASA/ brilinta/ statin/ lisinopril and coreg. Continue to have mild chest discomfort. Due to elevated BP, lisinopril dose was adjusted. The patient was given IV lasix for mild right basilar crackles and EF of 40-45% on cath. Echo showed improved EF to 50-55%, grade 2 DD, trace MR and TR. Dyspnea has resolved with lasix, felt he will not need lasix at home. Changed protonix back to omeprazole at discharge. The patient takes Celebrex for DDD, advised to avoid celebrex/ NSAIDs if able.  Patient presents for posthospital follow-up. He reports doing well. He is walking 15 minutes twice a day without any problems. He is taking Tylenol instead of Celebrex for his back. He has requested tramadol which helped in the hospital. He is taking all medications as prescribed. Did state that he was having some quivering in his heart/nervousness 1 day or so he took his clonazepam and that resolved it.   The patient currently denies nausea, vomiting, fever, chest pain, shortness of breath, orthopnea, dizziness, PND, cough, congestion, abdominal pain, hematochezia, melena, lower extremity edema,  claudication.  12/08/2015  Mr. [name] returns today for follow-up. He was seen after his recent acute MI by 2 physician assistants. The second office visit was for acute chest pain. There was concern as he had some moderate lesions on That were not revascularized that he may be having additional ischemia. He was admitted and ruled out for MI. He underwent nuclear stress testing which was negative for ischemia. He was then discharged. He says he has had no further chest pain symptoms. He is on low-dose imdur 15 mg daily. He's been compliant with aspirin and Brilinta and is exercising on his own. He says disease consider cardiac rehabilitation however it would cost him over $100 per session which is quite expensive. He may likely be able to do this exercise on his own. Today's asking if he could use Bactrim as needed for what sounds like recurrent abscesses. There is no issue that I have with it. He does get some dizziness with quick position changes. He denies any further chest pain.  01/06/2016  [name] returns today for follow-up. I received several messages from cardiac rehabilitation regarding ongoing chest pain symptoms. These have occurred with exercise, particularly cardiac rehabilitation and with sexual activity. He has stopped taking isosorbide due to headaches and intolerance to that medication and noted that it was not particularly helpful with chest pain. 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 In fact he was tearful in  the office today and says that he is considered switching careers although does not know what he would do at this late life.  01/27/2016  [name] was seen today in follow-up. He reports his chest pain is resolved. Unfortunately he's having problems with constipation and has recently had some mild rectal blood after bowel movements. He was determined to have hemorrhoids by his PCP and placed on Proctocort. The bleeding may be  more significant due to his antiplatelet therapy. He continues to have hard stools and would benefit from a stool softener and occasional use of relaxer constipation. He does take Metamucil.  04/14/2016  [name] returns today for follow-up. He is feeling much better.He is managed to lose a significant amount of was down to 200  At one point he was up to 245 pounds. He's exercising regularly and denies any chest pain. He is occasionally having dizziness. I have reviewed his home blood pressure list which was extensive and indicated that he at times has blood pressures around the 100 systolic or even in the 90s. Given his weight loss I feel that he is currently being over treated with medication. He is interested in a repeat echocardiogram to see if his LV function has normalized after anterior MI. EF was low at 50-55%. He had recent lab work from his primary care provider which showed normal renal function and a cholesterol of 91, triglycerides 96, HDL 39 and LDL 33. This represents excellent cholesterol control. He's also concerned about the cost of ranolazine, a medicine are not sure that he needs to stay on long-term. He reports his constipation and hemorrhoidal bleeding has resolved.  10/17/2016  [name] was seen today in follow-up. He says he still gets some occasional feeling of bruising in the chest which comes and goes but is not the cervix associated with exertion or relieved by rest. I don't believe this is angina. He was intolerant of nitrates and Ranexa but is done well with adjustments including carvedilol and supplementation with L-arginine and coQ10. He is on aspirin and Brilinta and is coming up on one year since his prior stent on 10/30/2015. Recently he's gained a little bit of weight to do less activity although blood pressure is still a goal. His cholesterol is well treated on atorvastatin with LDL-C of 33.  Wt Readings from Last 3 Encounters:  10/17/16 215 lb (97.5 kg)  04/14/16  208 lb 9.6 oz (94.6 kg)  04/01/16 213 lb 3 oz (96.7 kg)     Past Medical History:  Diagnosis Date  . Anxiety   . Arthritis    "right knee, back" (11/24/2015)  . CAD (coronary artery disease)    a. NSTEMI 10/30/15 s/p PTCA and DES of total occusion of  mid LAD. ostial and proximal LAD 50% stenosis, 50-70% PDA stenosis, and 70% stenosis of the first obtuse marginal.   . CHF (congestive heart failure) (HCC) 10/2015  . Childhood asthma   . Complication of anesthesia 1958   "died on the table when they did OR to dx TB"  . GERD (gastroesophageal reflux disease)   . Hyperlipidemia   . Hypertension   . Kidney stones   . NSTEMI (non-ST elevated myocardial infarction) (HCC) 10/2015  . Tuberculosis 1958    Current Outpatient Prescriptions  Medication Sig Dispense Refill  . allopurinol (ZYLOPRIM) 100 MG tablet Take 100 mg by mouth daily.    Marland Kitchen aspirin 81 MG tablet Take 81 mg by mouth daily as needed for pain. For  chest pain. Given by EMS    . atorvastatin (LIPITOR) 80 MG tablet Take 1 tablet (80 mg total) by mouth daily at 6 PM. 30 tablet 0  . carvedilol (COREG) 6.25 MG tablet Take 1 tablet (6.25 mg total) by mouth 2 (two) times daily with a meal. 180 tablet 3  . clonazePAM (KLONOPIN) 0.5 MG tablet Take 0.5 mg by mouth 2 (two) times daily as needed for anxiety.    . Coenzyme Q10 (CO Q-10) 300 MG CAPS Take 1 capsule by mouth daily.    . cyclobenzaprine (FLEXERIL) 10 MG tablet Take 10 mg by mouth 3 (three) times daily as needed for muscle pain.  1  . docusate sodium (COLACE) 100 MG capsule Take 1 capsule by mouth as directed.    Marland Kitchen L-Arginine 500 MG CAPS Take 1 tablet by mouth 2 (two) times daily.    . L-ARGININE PO Take 300 mg by mouth 3 (three) times daily.    Marland Kitchen lisinopril (PRINIVIL,ZESTRIL) 10 MG tablet Take 0.5 tablets (5 mg total) by mouth daily. 180 tablet 3  . nitroGLYCERIN (NITROSTAT) 0.4 MG SL tablet Place 1 tablet (0.4 mg total) under the tongue every 5 (five) minutes as needed for chest  pain. Given by EMS 25 tablet 12  . omeprazole (PRILOSEC) 20 MG capsule Take 40 mg by mouth 2 (two) times daily before a meal.     . potassium citrate (UROCIT-K) 10 MEQ (1080 MG) SR tablet Take 1 tablet (10 mEq total) by mouth 2 (two) times daily.    . psyllium (METAMUCIL) 58.6 % packet Take 1 packet by mouth at bedtime.    . sulfamethoxazole-trimethoprim (BACTRIM DS,SEPTRA DS) 800-160 MG tablet Take 1 tablet by mouth 2 (two) times daily. Takes PRN for a few days when he feels a "boil" coming on - ok with cardiologist    . ticagrelor (BRILINTA) 90 MG TABS tablet Take 1 tablet (90 mg total) by mouth 2 (two) times daily. 180 tablet 3  . traMADol (ULTRAM) 50 MG tablet Take 50 mg by mouth every 6 (six) hours as needed.  0  . vitamin C (ASCORBIC ACID) 500 MG tablet Take 500 mg by mouth daily.     No current facility-administered medications for this visit.     Allergies:    Allergies  Allergen Reactions  . Other Anaphylaxis    [place] nuts: Stop breathing   . Tyloxapol Itching and Nausea And Vomiting    Itching and vomiting  Tolerates other forms of oxycodone    Social History:  The patient  reports that he has quit smoking. His smoking use included Cigarettes. He has a 5.00 pack-year smoking history. He has quit using smokeless tobacco. His smokeless tobacco use included Chew. He reports that he drinks alcohol. He reports that he does not use drugs.   Family history:   Family History  Problem Relation Age of Onset  . Hypertension Maternal Grandmother   . Hypertension Maternal Grandfather   . Heart attack Paternal Uncle     ROS:  Please see the history of present illness.  All other systems reviewed and negative.   PHYSICAL EXAM: VS:  BP 114/64   Pulse (!) 53   Ht  (1.803 m)   Wt 215 lb (97.5 kg)   BMI 29.99 kg/m  General appearance: alert and no distress Lungs: clear to auscultation bilaterally Heart: regular rate and rhythm, S1, S2 normal, no murmur, click, rub or  gallop Extremities: extremities normal, atraumatic, no cyanosis  or edema Neurologic: Grossly normal  EKG:   Sinus bradycardia at 53  ASSESSMENT: 1. CAD status post anterior NSTEMI - DES to the mid-LAD with a 2.75 mm x 18 mm DES 2. Ischemic cardiomyopathy - EF 40-45% by cath (at 50% by echo) 3. Hypertension 4. Dyslipidemia 5. Obesity with recent weight loss  PLAN:  1. Mr. Cammarata continues to do well without any recurrent significant chest pain. EF has improved somewhat by echo after his NSTEMI. Blood pressure is well controlled. Cholesterol is at goal. He's had some recent weight gain and I've encouraged him to get back to his exercise which he was away from after getting a chest cold in the winter. He should be able to come off of Brilinta as of 10/29/2016. Otherwise no changes to his current medications. He does report a little fatigue and I encouraged a multivitamin. Some of his symptoms I think are related to stress which is an ongoing problem as a Education officer, environmental.  Follow-up in 6 months.  Chrystie Nose, MD, [facility] Attending Cardiologist [facility]

## 2016-10-17 NOTE — Patient Instructions (Signed)
Your physician wants you to follow-up in: 6 months with Dr. Rennis Golden. You will receive a reminder letter in the mail two months in advance. If you don't receive a letter, please call our office to schedule the follow-up appointment.  STOP Brilinta October 29, 2016

## 2017-01-02 ENCOUNTER — Other Ambulatory Visit: Payer: Self-pay | Admitting: Internal Medicine

## 2017-01-03 ENCOUNTER — Telehealth: Payer: Self-pay | Admitting: Internal Medicine

## 2017-01-03 NOTE — Telephone Encounter (Signed)
The patient wanted to notify that the Clonazepam has been increased to 2 times daily, prn. It has been updated.

## 2017-01-03 NOTE — Telephone Encounter (Signed)
New message     clonazePAM (KLONOPIN) 0.5 MG tablet Take 0.5 mg by mouth 2 (two) times daily as needed for anxiety.   His pcp changed dosage 2 times a day instead of once a day

## 2017-02-10 ENCOUNTER — Telehealth: Payer: Self-pay | Admitting: Internal Medicine

## 2017-02-10 NOTE — Telephone Encounter (Signed)
Mr Patrick Wilkins is calling because he is supposed to be walking , but his knee is damage and he has not been able walk for about 2 months, and because he is not walking his weight is going up . Wants to know what else can he do .  Please call .Thanks

## 2017-02-10 NOTE — Telephone Encounter (Signed)
Returned call to patient. He states he has been unable to exercise/walk on treadmill for the past 2 months d/t arthritis in his knee. He received a cortisone shot and was advised to stay off leg. He fell on that knee last Thursday as well. When he walks, he has shooting pains in his leg. He states he will need a knee replacement. He states he has 6 other heart blockages - stent placed 10/26/2015. He states he gained about 20lbs since he hasn't been able to exercise. Suggested to patient he could try elliptical or call orthopedist for recommendations. Also suggested upper body exercises.   Informed patient would notify MD of his concerns/seek recommendations. He is aware MD is OOO. He has f/up 8/20 with Dr. Rennis GoldenHilty

## 2017-02-20 ENCOUNTER — Telehealth: Payer: Self-pay | Admitting: Internal Medicine

## 2017-02-20 NOTE — Telephone Encounter (Signed)
NSAIDS carry an increase in risk cardiac thrombic events including MI.  Usually recommend that if the tylenol isn't working he take Aleve 1 tab twice daily for 1-2 days then go back to tylenol.  Can repeat this pattern once weekly if needed.

## 2017-02-20 NOTE — Telephone Encounter (Signed)
Returned call to patient of Dr. Rennis GoldenHilty who has questions about aspirin.   He has problems w/knee - needs partial knee replacement. It was recommended that he take Aleve. He states he did not think he could take Aleve w/current medications. He has taken Tylenol.   Will defer to pharmacy staff for med review/recommendations (if any)

## 2017-02-20 NOTE — Telephone Encounter (Signed)
New message   Pt called on 02/15/17 when the computer systems went down. He said he had a question about his aspirin.

## 2017-02-20 NOTE — Telephone Encounter (Signed)
Patient called w/pharmacist recommendations. Voiced understanding.

## 2017-02-27 ENCOUNTER — Ambulatory Visit (INDEPENDENT_AMBULATORY_CARE_PROVIDER_SITE_OTHER): Payer: BLUE CROSS/BLUE SHIELD | Admitting: Internal Medicine

## 2017-02-27 ENCOUNTER — Encounter: Payer: Self-pay | Admitting: Internal Medicine

## 2017-02-27 VITALS — BP 102/66 | HR 65 | Ht 71.0 in | Wt 233.0 lb

## 2017-02-27 DIAGNOSIS — E669 Obesity, unspecified: Secondary | ICD-10-CM | POA: Diagnosis not present

## 2017-02-27 DIAGNOSIS — I255 Ischemic cardiomyopathy: Secondary | ICD-10-CM

## 2017-02-27 DIAGNOSIS — I10 Essential (primary) hypertension: Secondary | ICD-10-CM | POA: Diagnosis not present

## 2017-02-27 DIAGNOSIS — E782 Mixed hyperlipidemia: Secondary | ICD-10-CM

## 2017-02-27 NOTE — Progress Notes (Signed)
Patient ID: Patrick Wilkins, male   DOB: 1956-06-25, 61 y.o.   MRN: [id number]    Date:  02/27/2017   ID:  Patrick Wilkins, DOB 1955/07/28, MRN [id number]  PCP:  Patrick Blossom, MD  Primary Cardiologist:  [facility]  Chief Complaint  Patient presents with  . Follow-up  C/o anxiety and stress   History of Present Illness: Patrick Wilkins is a 61 y.o. male history of tobacco abuse but no known cardiac history presented 10/30/15 with chest pain. Reports starting around 6pm onset of 10/10 pressure in midchest with diaphoresis and SOB. Symptoms improved but not resolved with SL NG x3, also received ASA in the field and 6mg  of IV morphine. Pain down from 10/10 to 5/10 but not resolved. Brought in as code STEMI by EMS. Initial troponin of 0.11.   Cath with multivessel CAD. S/p PTCA and DES of total occlusion of mid LAD. Continued ASA/ brilinta/ statin/ lisinopril and coreg. Continue to have mild chest discomfort. Due to elevated BP, lisinopril dose was adjusted. The patient was given IV lasix for mild right basilar crackles and EF of 40-45% on cath. Echo showed improved EF to 50-55%, grade 2 DD, trace MR and TR. Dyspnea has resolved with lasix, felt he will not need lasix at home. Changed protonix back to omeprazole at discharge. The patient takes Celebrex for DDD, advised to avoid celebrex/ NSAIDs if able.  Patient presents for posthospital follow-up. He reports doing well. He is walking 15 minutes twice a day without any problems. He is taking Tylenol instead of Celebrex for his back. He has requested tramadol which helped in the hospital. He is taking all medications as prescribed. Did state that he was having some quivering in his heart/nervousness 1 day or so he took his clonazepam and that resolved it.   The patient currently denies nausea, vomiting, fever, chest pain, shortness of breath, orthopnea, dizziness, PND, cough, congestion, abdominal pain, hematochezia, melena, lower extremity edema,  claudication.  12/08/2015  [name] for follow-up. He was seen after his recent acute MI by 2 physician assistants. The second office visit was for acute chest pain. There was concern as he had some moderate lesions on That were not revascularized that he may be having additional ischemia. He was admitted and ruled out for MI. He underwent nuclear stress testing which was negative for ischemia. He was then discharged. He says he has had no further chest pain symptoms. He is on low-dose imdur 15 mg daily. He's been compliant with aspirin and Brilinta and is exercising on his own. He says disease consider cardiac rehabilitation however it would cost him over $100 per session which is quite expensive. He may likely be able to do this exercise on his own. Today's asking if he could use Bactrim as needed for what sounds like recurrent abscesses. There is no issue that I have with it. He does get some dizziness with quick position changes. He denies any further chest pain.  01/06/2016  [name] for follow-up. I received several messages from cardiac rehabilitation regarding ongoing chest pain symptoms. These have occurred with exercise, particularly cardiac rehabilitation and with sexual activity. He has stopped taking isosorbide due to headaches and intolerance to that medication and noted that it was not particularly helpful with chest pain. 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 In fact he was tearful in the office today and  says that he is considered switching careers although does not know what he would do at this late life.  01/27/2016  [name] was seen today in follow-up. He reports his chest pain is resolved. Unfortunately he's having problems with constipation and has recently had some mild rectal blood after bowel movements. He was determined to have hemorrhoids by his PCP and placed on Proctocort. The bleeding may be  more significant due to his antiplatelet therapy. He continues to have hard stools and would benefit from a stool softener and occasional use of relaxer constipation. He does take Metamucil.  04/14/2016  [name] for follow-up. He is feeling much better.He is managed to lose a significant amount of was down to 200  At one point he was up to 245 pounds. He's exercising regularly and denies any chest pain. He is occasionally having dizziness. I have reviewed his home blood pressure list which was extensive and indicated that he at times has blood pressures around the 100 systolic or even in the 90s. Given his weight loss I feel that he is currently being over treated with medication. He is interested in a repeat echocardiogram to see if his LV function has normalized after anterior MI. EF was low at 50-55%. He had recent lab work from his primary care provider which showed normal renal function and a cholesterol of 91, triglycerides 96, HDL 39 and LDL 33. This represents excellent cholesterol control. He's also concerned about the cost of ranolazine, a medicine are not sure that he needs to stay on long-term. He reports his constipation and hemorrhoidal bleeding has resolved.  10/17/2016  [name] was seen today in follow-up. He says he still gets some occasional feeling of bruising in the chest which comes and goes but is not the cervix associated with exertion or relieved by rest. I don't believe this is angina. He was intolerant of nitrates and Ranexa but is done well with adjustments including carvedilol and supplementation with L-arginine and coQ10. He is on aspirin and Brilinta and is coming up on one year since his prior stent on 10/30/2015. Recently he's gained a little bit of weight to do less activity although blood pressure is still a goal. His cholesterol is well treated on atorvastatin with LDL-C of 33.  02/27/2017  [name] for follow-up. He continues to be under  significant amount of stress. 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 Unfortunately he's gained another 18 pounds since her last saw him. He's been particularly immobile and is having problems with right knee pain. 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 He potentially will need partial knee replacement. He denies any cardiac chest pain or worsening shortness of breath. Recent lipid profile from his PCP and June 2018 showed total cluster 106, triglycerides 80, HDL 39 and LDL 51.  Wt Readings from Last 3 Encounters:  02/27/17 233 lb (105.7 kg)  10/17/16 215 lb (97.5 kg)  04/14/16 208 lb 9.6 oz (94.6 kg)     Past Medical History:  Diagnosis Date  . Anxiety   . Arthritis    "right knee, back" (11/24/2015)  . CAD (coronary artery disease)    a. NSTEMI 10/30/15 s/p PTCA and DES of total occusion of  mid LAD. ostial and proximal LAD 50% stenosis, 50-70% PDA stenosis, and 70% stenosis of the first obtuse marginal.   . CHF (congestive  heart failure) (HCC) 10/2015  . Childhood asthma   . Complication of anesthesia 1958   "died on the table when they did OR to dx TB"  . GERD (gastroesophageal reflux disease)   . Hyperlipidemia   . Hypertension   . Kidney stones   . NSTEMI (non-ST elevated myocardial infarction) (HCC) 10/2015  . Tuberculosis 1958    Current Outpatient Prescriptions  Medication Sig Dispense Refill  . allopurinol (ZYLOPRIM) 100 MG tablet Take 100 mg by mouth daily.    Marland Kitchen aspirin 81 MG tablet Take 81 mg by mouth daily as needed for pain. For chest pain. Given by EMS    . atorvastatin (LIPITOR) 80 MG tablet Take 1 tablet (80 mg total) by mouth daily at 6 PM. 30 tablet 0  . carvedilol (COREG) 6.25 MG tablet Take 1 tablet (6.25 mg total) by mouth 2 (two) times daily with a meal. 180 tablet 3  . clonazePAM (KLONOPIN) 0.5 MG tablet Take 0.5 mg by mouth 2 (two) times daily as needed for  anxiety.    . Coenzyme Q10 (CO Q-10) 300 MG CAPS Take 1 capsule by mouth daily.    . cyclobenzaprine (FLEXERIL) 10 MG tablet Take 10 mg by mouth 3 (three) times daily as needed for muscle pain.  1  . docusate sodium (COLACE) 100 MG capsule Take 1 capsule by mouth as directed.    Marland Kitchen L-Arginine 500 MG CAPS Take 1 tablet by mouth 2 (two) times daily.    . L-ARGININE PO Take 300 mg by mouth 3 (three) times daily.    Marland Kitchen lisinopril (PRINIVIL,ZESTRIL) 10 MG tablet Take 0.5 tablets (5 mg total) by mouth daily. 180 tablet 3  . Multiple Vitamins-Minerals (CENTRUM SILVER PO) Take 1 tablet by mouth daily.    . nitroGLYCERIN (NITROSTAT) 0.4 MG SL tablet Place 1 tablet (0.4 mg total) under the tongue every 5 (five) minutes as needed for chest pain. Given by EMS 25 tablet 12  . omeprazole (PRILOSEC) 20 MG capsule Take 40 mg by mouth daily.     . potassium citrate (UROCIT-K) 10 MEQ (1080 MG) SR tablet Take 1 tablet (10 mEq total) by mouth 2 (two) times daily.    . psyllium (METAMUCIL) 58.6 % packet Take 1 packet by mouth at bedtime.    . sulfamethoxazole-trimethoprim (BACTRIM DS,SEPTRA DS) 800-160 MG tablet Take 1 tablet by mouth as needed. Takes PRN for a few days when he feels a "boil" coming on - ok with cardiologist     . traMADol (ULTRAM) 50 MG tablet Take 50 mg by mouth every 6 (six) hours as needed.  0  . vitamin C (ASCORBIC ACID) 500 MG tablet Take 500 mg by mouth daily.     No current facility-administered medications for this visit.     Allergies:    Allergies  Allergen Reactions  . Other Anaphylaxis    [place] nuts: Stop breathing   . Tyloxapol Itching and Nausea And Vomiting    Itching and vomiting  Tolerates other forms of oxycodone    Social History:  The patient  reports that he has quit smoking. His smoking use included Cigarettes. He has a 5.00 pack-year smoking history. He has quit using smokeless tobacco. His smokeless tobacco use included Chew. He reports that he drinks alcohol. He  reports that he does not use drugs.   Family history:   Family History  Problem Relation Age of Onset  . Hypertension Maternal Grandmother   . Hypertension Maternal Grandfather   .  Heart attack Paternal Uncle     ROS:  Pertinent items noted in HPI and remainder of comprehensive ROS otherwise negative.  PHYSICAL EXAM: VS:  BP 102/66   Pulse 65   Ht 5\' 11"  (1.803 m)   Wt 233 lb (105.7 kg)   BMI 32.50 kg/m  General appearance: alert, no distress and mildly obese Neck: no carotid bruit, no JVD and thyroid not enlarged, symmetric, no tenderness/mass/nodules Lungs: clear to auscultation bilaterally Heart: regular rate and rhythm, S1, S2 normal, no murmur, click, rub or gallop Abdomen: soft, non-tender; bowel sounds normal; no masses,  no organomegaly Extremities: extremities normal, atraumatic, no cyanosis or edema Pulses: 2+ and symmetric Skin: Skin color, texture, turgor normal. No rashes or lesions Neurologic: Grossly normal Psych: Pleasant  EKG:   Normal sinus rhythm at 65  ASSESSMENT: 1. CAD status post anterior NSTEMI - DES to the mid-LAD with a 2.75 mm x 18 mm DES 2. Ischemic cardiomyopathy - EF 40-45% by cath (at 50% by echo) 3. Hypertension 4. Dyslipidemia 5. Obesity with recent weight gain  PLAN:  1. Mr. Pieczynski seems to be doing well without any angina but has had significant recent weight gain. He is now up about 30 pounds over the past year. He is inactive mostly due to right knee pain and may need right knee partial replacement. His EF has normalized by echo and he is more than a year since his drug-eluting stent. We'll continue his current medications. He understands that the significant stress is under is not healthy and he may need to reevaluate this for some lifestyle changes. I also offered him some exercises he can do that would not put a lot of stress on his knee.  Follow-up in 6 months.  Chrystie Nose, MD, [facility] Attending Cardiologist [facility]

## 2017-02-27 NOTE — Patient Instructions (Signed)
Your physician wants you to follow-up in: 6 months with Dr. Hilty. You will receive a reminder letter in the mail two months in advance. If you don't receive a letter, please call our office to schedule the follow-up appointment.    

## 2017-04-02 ENCOUNTER — Other Ambulatory Visit: Payer: Self-pay | Admitting: Internal Medicine

## 2017-04-15 ENCOUNTER — Other Ambulatory Visit: Payer: Self-pay | Admitting: Physician Assistant

## 2017-06-23 ENCOUNTER — Encounter: Payer: Self-pay | Admitting: Internal Medicine

## 2017-06-23 ENCOUNTER — Ambulatory Visit: Payer: BLUE CROSS/BLUE SHIELD | Admitting: Internal Medicine

## 2017-06-23 ENCOUNTER — Ambulatory Visit (INDEPENDENT_AMBULATORY_CARE_PROVIDER_SITE_OTHER)
Admission: RE | Admit: 2017-06-23 | Discharge: 2017-06-23 | Disposition: A | Discharge status: Home / Self Care | Payer: BLUE CROSS/BLUE SHIELD | Source: Ambulatory Visit | Attending: Internal Medicine | Admitting: Internal Medicine

## 2017-06-23 VITALS — BP 142/78 | HR 74 | Ht 70.5 in | Wt 250.8 lb

## 2017-06-23 DIAGNOSIS — I10 Essential (primary) hypertension: Secondary | ICD-10-CM | POA: Diagnosis not present

## 2017-06-23 DIAGNOSIS — R05 Cough: Secondary | ICD-10-CM | POA: Diagnosis not present

## 2017-06-23 DIAGNOSIS — R058 Other specified cough: Secondary | ICD-10-CM

## 2017-06-23 IMAGING — DX DG CHEST 2V
2 series · 2 of 2 positions shown · non-contrast
Comparison: [DATE]

CLINICAL DATA: Cough, wheezing, shortness of breath

EXAM:
CHEST  2 VIEW

[chest pa]
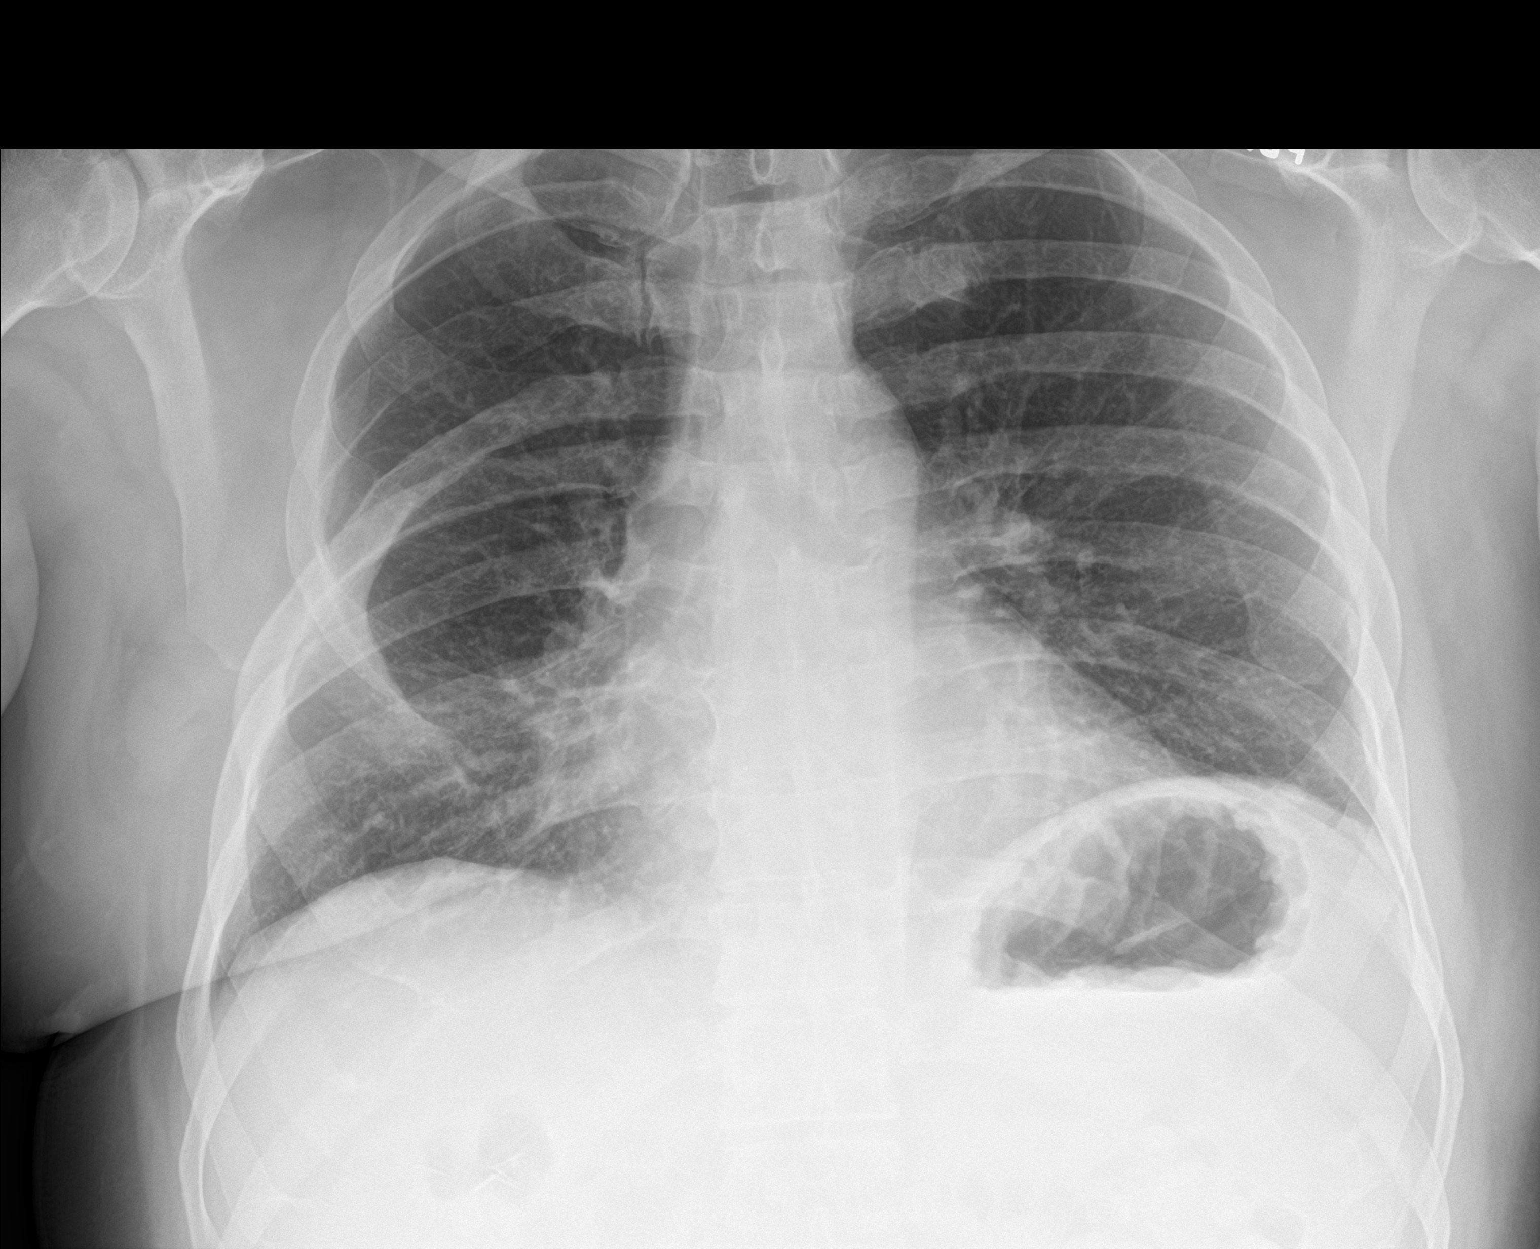

[chest lat]
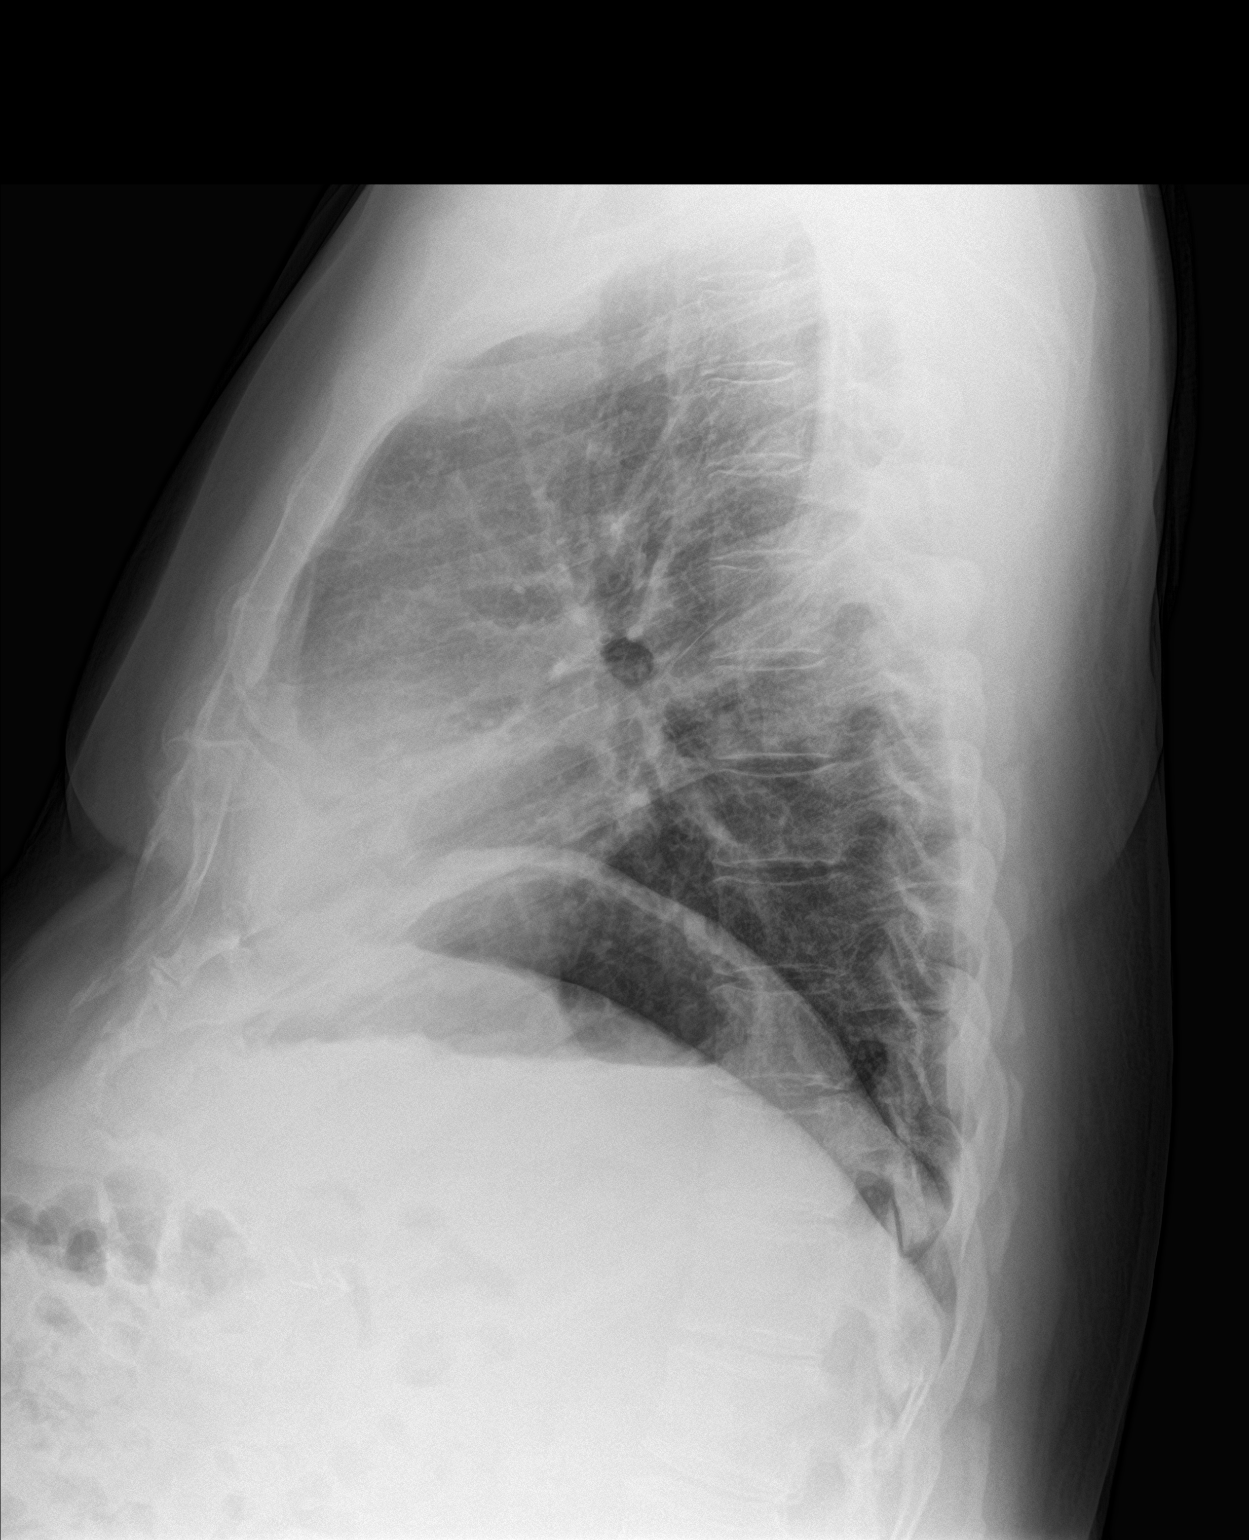

[2 of 2 positions shown; findings below may reference images not displayed]

FINDINGS: Heart and mediastinal contours are within normal limits. No focal
opacities or effusions. No acute bony abnormality. Mild chronic
peribronchial thickening. Old right fifth rib fracture and deformity
again noted, stable.
IMPRESSION: Stable mild chronic bronchitic changes.  No active disease.

## 2017-06-23 MED ORDER — LOSARTAN POTASSIUM 50 MG PO TABS: 50.0000 mg | ORAL_TABLET | Freq: Every day | ORAL | 11 refills | Status: DC

## 2017-06-23 NOTE — Patient Instructions (Signed)
Stop lisinopril immediately   Losartan 50 mg one daily   Please remember to go to the  x-ray department downstairs in the basement  for your tests - we will call you with the results when they are available.      Please schedule a follow up office visit in 6 weeks, call sooner if needed with full pfts

## 2017-06-23 NOTE — Progress Notes (Signed)
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  basic training s sob  and never pushed himself that hard again but did start jogging up [place] 2 [place] finished 1992 and did fine until around 10/2015 with MI > [facility] and placed on acei and breathing / coughing variably problematic ever since so referred to pulmonary clinic 06/23/2017 by Dr Glorianne ManchesterKen Morgan    06/23/2017 1st [place] [facility] office visit/ Wert   Because of hoarsenness/ nasal drainage/ sorethroat > ENT doctor > dx GERD 05/31/17  Omeprazole bid not ac Presently limited by knee more than breathing wit activities Cough is dry and day > noct but presently daily "ever since my heart attack"  (which corresponds to rx with ACEi  = [facility])   No obvious day to day or daytime variability or assoc excess/ purulent sputum or mucus plugs or hemoptysis or cp or chest tightness, subjective wheeze or overt sinus or hb symptoms on ppi bid. No unusual exposure hx or  knowledge of premature birth.  Sleeping ok flat without nocturnal  or early am exacerbation  of respiratory  c/o's or need for noct saba. Also denies any obvious fluctuation of symptoms with weather or environmental changes or other aggravating or alleviating factors except as outlined above   Current Allergies, Complete Past Medical History, Past Surgical History, Family History, and Social History were reviewed in [facility] electronic medical record.  ROS  The following are not active complaints unless bolded Hoarseness, sore throat, dysphagia, dental problems, itching, sneezing,  nasal congestion or discharge of excess mucus or  purulent secretions, ear ache,   fever, chills, sweats, unintended wt loss or wt gain, classically pleuritic or exertional cp,  orthopnea pnd or leg swelling, presyncope, palpitations, abdominal pain, anorexia, nausea, vomiting, diarrhea  or change in bowel habits or change in bladder habits, change in stools or change in urine, dysuria, hematuria,  rash, arthralgias, visual complaints, headache, numbness, weakness or ataxia or problems with walking or coordination,  change in mood/affect or memory.        Current Meds - not able to verfy, did not bring meds   Medication Sig  . allopurinol (ZYLOPRIM) 100 MG tablet Take 100 mg by mouth daily.  Marland Kitchen. aspirin 81 MG tablet Take 81 mg by mouth daily as needed for pain. For chest pain. Given by EMS  . atorvastatin (LIPITOR) 80 MG tablet TAKE 1 TABLET (80 MG TOTAL) BY MOUTH DAILY AT 6 PM.  . carvedilol (COREG) 6.25 MG tablet Take 1 tablet (6.25 mg total) by mouth 2 (two) times daily with a meal.  . clonazePAM (KLONOPIN) 0.5 MG tablet Take 0.5 mg by mouth 2 (two) times daily as needed for anxiety.  . cyclobenzaprine (FLEXERIL) 10 MG tablet Take 10 mg by mouth 3 (three) times daily as needed for muscle pain.  Marland Kitchen. docusate sodium (COLACE) 100 MG capsule Take 1 capsule by mouth as directed.  Marland Kitchen. L-Arginine 500 MG CAPS Take 1 tablet by mouth 2 (two) times daily.  . L-ARGININE PO Take 300  mg by mouth 3 (three) times daily.  . Multiple Vitamins-Minerals (CENTRUM SILVER PO) Take 1 tablet by mouth daily.  . nitroGLYCERIN (NITROSTAT) 0.4 MG SL tablet Place 1 tablet (0.4 mg total) under the tongue every 5 (five) minutes as needed for chest pain. Given by EMS  . omeprazole (PRILOSEC) 20 MG capsule Take 40 mg by mouth daily.   . potassium citrate (UROCIT-K) 10 MEQ (1080 MG) SR tablet Take 1 tablet (10 mEq total) by mouth 2 (two) times daily.  . psyllium (METAMUCIL) 58.6 % packet Take 1 packet by mouth at bedtime.  . sulfamethoxazole-trimethoprim (BACTRIM DS,SEPTRA DS)  800-160 MG tablet Take 1 tablet by mouth as needed. Takes PRN for a few days when he feels a "boil" coming on - ok with cardiologist   . traMADol (ULTRAM) 50 MG tablet Take 50 mg by mouth every 6 (six) hours as needed.  . vitamin C (ASCORBIC ACID) 500 MG tablet Take 500 mg by mouth daily.  . [     .   lisinopril (PRINIVIL,ZESTRIL) 10 MG tablet TAKE ONE-HALF (1/2) TABLET DAILY                    Review of Systems     Objective:   Physical Exam    amb wm quite pleasant but  failed to answer questions asked in a straightforward manner, tending to go off on tangents or answer questions with ambiguous medical terms or diagnoses  "asthma/ bronchitis/ allergies"   Wt Readings from Last 3 Encounters:  06/23/17 250 lb 12.8 oz (113.8 kg)  02/27/17 233 lb (105.7 kg)  10/17/16 215 lb (97.5 kg)     Vital signs reviewed - Note on arrival 02 sats  97% on RA     HEENT: nl dentition, turbinates bilaterally, and oropharynx. Nl external ear canals without cough reflex   NECK :  without JVD/Nodes/TM/ nl carotid upstrokes bilaterally   LUNGS: no acc muscle use,  Nl contour chest which is clear to A and P bilaterally without cough on insp or exp maneuvers   CV:  RRR  no s3 or murmur or increase in P2, and no edema   ABD:  soft and nontender with nl inspiratory excursion in the supine position. No bruits or organomegaly appreciated, bowel sounds nl  MS:  Nl gait/ ext warm without deformities, calf tenderness, cyanosis or clubbing No obvious joint restrictions   SKIN: warm and dry without lesions    NEURO:  alert, approp, nl sensorium with  no motor or cerebellar deficits apparent.     CXR PA and Lateral:   06/23/2017 :    I personally reviewed images and agree with radiology impression as follows:    Stable mild chronic bronchitic changes.  No active disease.     Assessment:

## 2017-06-23 NOTE — Assessment & Plan Note (Deleted)
Try off acei 06/23/2017

## 2017-06-24 ENCOUNTER — Encounter: Payer: Self-pay | Admitting: Internal Medicine

## 2017-06-24 NOTE — Assessment & Plan Note (Addendum)
In the best review of chronic cough to date ( NEJM 2016 375 [phone]) ,  ACEi are now felt to cause cough in up to  20% of pts which is a 4 fold increase from previous reports and does not include the variety of non-specific complaints we see in pulmonary clinic in pts on ACEi but previously attributed to another dx like  Copd/asthma and  include PNDS, throat and chest congestion, "bronchitis", unexplained dyspnea and noct "strangling" sensations, and hoarseness, but also  atypical /refractory GERD symptoms like dysphagia and "bad heartburn"   The only way I know  to prove this is not an "ACEi Case" is a trial off ACEi x a minimum of 6 weeks then regroup.   Try losartan 50 mg daily then return with pfts in 6 weeks to close the loop on any other pulmonary issues that remain at that point.

## 2017-06-24 NOTE — Assessment & Plan Note (Addendum)
ENT eval 05/31/17 dx LPR/ GERD rec ppi bid Pulmonary eval 06/23/2017 rec d/c ACEi  The most common causes of chronic cough in immunocompetent adults include the following: upper airway cough syndrome (UACS), previously referred to as postnasal drip syndrome (PNDS), which is caused by variety of rhinosinus conditions; (2) asthma; (3) GERD; (4) chronic bronchitis from cigarette smoking or other inhaled environmental irritants; (5) nonasthmatic eosinophilic bronchitis; and (6) bronchiectasis.   These conditions, singly or in combination, have accounted for up to 94% of the causes of chronic cough in prospective studies.   Other conditions have constituted no >6% of the causes in prospective studies These have included bronchogenic carcinoma, chronic interstitial pneumonia, sarcoidosis, left ventricular failure, ACEI-induced cough, and aspiration from a condition associated with pharyngeal dysfunction.    Chronic cough is often simultaneously caused by more than one condition. A single cause has been found from 38 to 82% of the time, multiple causes from 18 to 62%. Multiply caused cough has been the result of three diseases up to 42% of the time.      Although the use of coreg brings up a  Concern with asthma/ cough variant,    most likely this is Upper airway cough syndrome (previously labeled PNDS),  is so named because it's frequently impossible to sort out how much is  CR/sinusitis with freq throat clearing (which can be related to primary GERD)   vs  causing  secondary (" extra esophageal")  GERD from wide swings in gastric pressure that occur with throat clearing, often  promoting self use of mint and menthol lozenges that reduce the lower esophageal sphincter tone and exacerbate the problem further in a cyclical fashion.   These are the same pts (now being labeled as having "irritable larynx syndrome" by some cough centers) who not infrequently have a history of having failed to tolerate ace  inhibitors,  dry powder inhalers or biphosphonates or report having atypical/extraesophageal reflux symptoms that don't respond to standard doses of PPI  and are easily confused as having aecopd or asthma flares by even experienced allergists/ pulmonologists (myself included).   ent eval showed gerd and my experience is that gerd which causes upper airways inflammation and ACEi which inhibits the main proinfammatory cytokine (bradykinin) from being metabolized ware therefore synergistic in contributing to non-specific upper airway complaints.  rec therefore max rx for gerd and off acei then return for full pfts in 6 weeks    Total time devoted to counseling  > 50 % of initial 60 min office visit:  review case with pt/ discussion of options/alternatives/ personally creating written customized instructions  in presence of pt  then going over those specific  Instructions directly with the pt including how to use all of the meds but in particular covering each new medication in detail and the difference between the maintenance= "automatic" meds and the prns using an action plan format for the latter (If this problem/symptom => do that organization reading Left to right).  Please see AVS from this visit for a full list of these instructions which I personally wrote for this pt and  are unique to this visit.

## 2017-06-26 NOTE — Progress Notes (Signed)
Spoke with pt and notified of results per Dr. Wert. Pt verbalized understanding and denied any questions. 

## 2017-07-11 HISTORY — PX: OTHER SURGICAL HISTORY: SHX169

## 2017-08-08 ENCOUNTER — Ambulatory Visit: Payer: BLUE CROSS/BLUE SHIELD | Admitting: Internal Medicine

## 2017-08-18 ENCOUNTER — Ambulatory Visit: Payer: BLUE CROSS/BLUE SHIELD | Admitting: Internal Medicine

## 2017-08-26 ENCOUNTER — Other Ambulatory Visit: Payer: Self-pay | Admitting: Cardiology

## 2017-08-26 DIAGNOSIS — I10 Essential (primary) hypertension: Secondary | ICD-10-CM

## 2017-08-26 MED ORDER — CARVEDILOL 6.25 MG PO TABS: 6.2500 mg | ORAL_TABLET | Freq: Two times a day (BID) | ORAL | 3 refills | Status: DC

## 2017-09-04 ENCOUNTER — Ambulatory Visit (INDEPENDENT_AMBULATORY_CARE_PROVIDER_SITE_OTHER): Payer: BLUE CROSS/BLUE SHIELD | Admitting: Internal Medicine

## 2017-09-04 ENCOUNTER — Ambulatory Visit: Payer: BLUE CROSS/BLUE SHIELD | Admitting: Internal Medicine

## 2017-09-04 ENCOUNTER — Encounter: Payer: Self-pay | Admitting: Internal Medicine

## 2017-09-04 VITALS — BP 114/72 | HR 69 | Ht 70.25 in | Wt 256.0 lb

## 2017-09-04 DIAGNOSIS — I10 Essential (primary) hypertension: Secondary | ICD-10-CM

## 2017-09-04 DIAGNOSIS — R058 Other specified cough: Secondary | ICD-10-CM

## 2017-09-04 DIAGNOSIS — R05 Cough: Secondary | ICD-10-CM

## 2017-09-04 LAB — PULMONARY FUNCTION TEST
DL/VA % pred: 122 %
DL/VA: 5.65 ml/min/mmHg/L
DLCO UNC: 20.43 ml/min/mmHg
DLCO unc % pred: 62 %
FEF 25-75 PRE: 2.03 L/s
FEF 25-75 Post: 2.29 L/sec
FEF2575-%Change-Post: 13 %
FEF2575-%Pred-Post: 78 %
FEF2575-%Pred-Pre: 69 %
FEV1-%CHANGE-POST: 0 %
FEV1-%PRED-POST: 52 %
FEV1-%PRED-PRE: 51 %
FEV1-POST: 1.88 L
FEV1-Pre: 1.86 L
FEV1FVC-%Change-Post: 4 %
FEV1FVC-%Pred-Pre: 111 %
FEV6-%Change-Post: -3 %
FEV6-%PRED-POST: 47 %
FEV6-%PRED-PRE: 48 %
FEV6-Post: 2.15 L
FEV6-Pre: 2.22 L
FEV6FVC-%PRED-PRE: 105 %
FEV6FVC-%Pred-Post: 105 %
FVC-%Change-Post: -3 %
FVC-%PRED-POST: 45 %
FVC-%PRED-PRE: 46 %
FVC-PRE: 2.22 L
FVC-Post: 2.15 L
POST FEV6/FVC RATIO: 100 %
PRE FEV6/FVC RATIO: 100 %
Post FEV1/FVC ratio: 87 %
Pre FEV1/FVC ratio: 84 %
RV % PRED: 80 %
RV: 1.83 L
TLC % PRED: 58 %
TLC: 4.13 L

## 2017-09-04 NOTE — Assessment & Plan Note (Addendum)
Try off acei 06/23/2017  Pulmonary eval 06/23/2017 rec d/c ACEi > resolved - PFT's  09/04/2017  FEV1 1.88 (52 % ) ratio 87  p bi % improvement from saba p nothing prior to study with DLCO  62 % corrects to 122  % for alv volume  And ERV  9%    No evidence at all of asthma at this point so still strongly support dx of Upper airway cough syndrome (previously labeled PNDS),  is so named because it's frequently impossible to sort out how much is  CR/sinusitis with freq throat clearing (which can be related to primary GERD)   vs  causing  secondary (" extra esophageal")  GERD from wide swings in gastric pressure that occur with throat clearing, often  promoting self use of mint and menthol lozenges that reduce the lower esophageal sphincter tone and exacerbate the problem further in a cyclical fashion.   These are the same pts (now being labeled as having "irritable larynx syndrome" by some cough centers) who not infrequently have a history of having failed to tolerate ace inhibitors,  dry powder inhalers or biphosphonates or report having atypical/extraesophageal reflux symptoms that don't respond to standard doses of PPI  and are easily confused as having aecopd or asthma flares by even experienced allergists/ pulmonologists (myself included).   I had an extended final summary discussion with the patient reviewing all relevant studies completed to date and  lasting 15 to 20 minutes of a 25 minute visit on the following issues:   rec maintain rx for gerd for at least 3 months then may be able to taper to h2 rx    Each maintenance medication was reviewed in detail including most importantly the difference between maintenance and as needed and under what circumstances the prns are to be used.  Please see AVS for specific  Instructions which are unique to this visit and I personally typed out  which were reviewed in detail in writing with the patient and a copy provided.    Pulmonary f/u can be prn

## 2017-09-04 NOTE — Progress Notes (Signed)
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 but ultimately able to run and play with all the other kids and "took a white pill for breathing until age 62" and did fine, passed a [employer] physical then medical discharge w/in 2 months as could not finish  basic training s sob  and never pushed himself that hard again but did start jogging up to 2 [place]bible college finished 1992 and did fine until around 10/2015 with MI > [facility] and placed on acei and breathing / coughing variably problematic ever since so referred to pulmonary clinic 06/23/2017 by Dr Glorianne ManchesterKen Morgan      History of Present Illness  06/23/2017 1st [facility] office visit/ Jesscia Imm   Because of hoarsenness/ nasal drainage/ sorethroat > ENT doctor > dx GERD 05/31/17  Omeprazole bid not ac Presently limited by knee more than breathing wit activities Cough is dry and day > noct but presently daily "ever since my heart attack"  (which corresponds to rx with ACEi  [facility])  rec Stop lisinopril immediately  Losartan 50 mg one daily  Please schedule a follow up office visit in 6 weeks, call sooner if needed with full pfts    09/04/2017  f/u ov/Alston Berrie re: f/u cough sinc 10/2015  ? Acei related > resolved  Chief Complaint  Patient presents with  . Follow-up    PFT's done. He states he is getting over bronchitis.    Dyspnea:  Limited by R > L knee  Cough: resolved Sleep on wedge : wife says he snores/ some 5pm drowsiness but not excessive/ he thinks he sleeps fine  SABA use:  Not needing any albuterol   No obvious day to day or daytime variability or assoc excess/ purulent sputum or mucus plugs or hemoptysis or cp or chest tightness, subjective wheeze or overt sinus or hb symptoms. No unusual exposure hx or h/o childhood pna/ asthma or knowledge of premature birth.     Also denies any obvious fluctuation of symptoms with weather or environmental changes or other aggravating or alleviating factors except as outlined above   Current Allergies, Complete Past Medical History, Past Surgical History, Family History, and Social History were reviewed in [facility] electronic medical record.  ROS  The following are not active complaints unless bolded Hoarseness, sore throat, dysphagia, dental problems, itching, sneezing,  nasal congestion or discharge of excess mucus or purulent secretions, ear ache,   fever, chills, sweats, unintended wt loss or wt gain, classically pleuritic or exertional cp,  orthopnea pnd or leg swelling, presyncope, palpitations, abdominal pain, anorexia, nausea, vomiting, diarrhea  or change in bowel habits or change in bladder habits, change in stools or change in urine, dysuria, hematuria,  rash, arthralgias, visual complaints, headache, numbness, weakness or ataxia or problems with walking or coordination,  change in mood/affect or memory.        Current Meds  Medication Sig  . allopurinol (ZYLOPRIM) 100 MG tablet Take 100 mg by mouth daily.  Marland Kitchen. aspirin 81 MG tablet Take 81 mg by mouth daily as needed for pain. For chest pain. Given by EMS  . atorvastatin (LIPITOR) 80 MG tablet TAKE 1 TABLET (80 MG TOTAL) BY MOUTH DAILY AT 6 PM.  . baclofen (LIORESAL) 10  MG tablet Take 10 mg by mouth 3 (three) times daily.  . carvedilol (COREG) 6.25 MG tablet Take 1 tablet (6.25 mg total) by mouth 2 (two) times daily with a meal.  . clonazePAM (KLONOPIN) 0.5 MG tablet Take 0.5 mg by mouth 2 (two) times daily as needed for anxiety.  . docusate sodium (COLACE) 100 MG capsule Take 1 capsule by mouth as directed.  Marland Kitchen guaiFENesin-codeine 100-10 MG/5ML syrup 5 ml every 6 hours as needed  . losartan (COZAAR) 50 MG tablet Take 1 tablet (50 mg total) by mouth daily.  . Multiple Vitamins-Minerals (CENTRUM SILVER PO) Take 1 tablet by mouth daily.  . nitroGLYCERIN  (NITROSTAT) 0.4 MG SL tablet Place 1 tablet (0.4 mg total) under the tongue every 5 (five) minutes as needed for chest pain. Given by EMS  . omeprazole (PRILOSEC) 20 MG capsule Take 40 mg by mouth daily.   . potassium citrate (UROCIT-K) 10 MEQ (1080 MG) SR tablet Take 1 tablet (10 mEq total) by mouth 2 (two) times daily.  Marland Kitchen PROAIR HFA 108 (90 Base) MCG/ACT inhaler Inhale 2 puffs into the lungs every 4 (four) hours as needed.  . psyllium (METAMUCIL) 58.6 % packet Take 1 packet by mouth at bedtime.  . traMADol (ULTRAM) 50 MG tablet Take 50 mg by mouth every 6 (six) hours as needed.  . vitamin C (ASCORBIC ACID) 500 MG tablet Take 500 mg by mouth daily.                       Objective:   Physical Exam    amb wm quite pleasant but  failed to answer questions    09/04/2017       256   06/23/17 250 lb 12.8 oz (113.8 kg)  02/27/17 233 lb (105.7 kg)  10/17/16 215 lb (97.5 kg)     Vital signs reviewed - Note on arrival 02 sats  95% on RA    HEENT: nl dentition, turbinates bilaterally, and oropharynx. Nl external ear canals without cough reflex   NECK :  without JVD/Nodes/TM/ nl carotid upstrokes bilaterally   LUNGS: no acc muscle use,  Nl contour chest which is clear to A and P bilaterally without cough on insp or exp maneuvers   CV:  RRR  no s3 or murmur or increase in P2, and no edema   ABD:  Quite obese but soft and nontender with limited  inspiratory excursion in the supine position. No bruits or organomegaly appreciated, bowel sounds nl  MS:  Nl gait/ ext warm without deformities, calf tenderness, cyanosis or clubbing No obvious joint restrictions   SKIN: warm and dry without lesions    NEURO:  alert, approp, nl sensorium with  no motor or cerebellar deficits apparent.         CXR PA and Lateral:   06/23/2017 :    I personally reviewed images and agree with radiology impression as follows:    Stable mild chronic bronchitic changes.  No active  disease.     Assessment:

## 2017-09-04 NOTE — Assessment & Plan Note (Signed)
D/c acei due to cough since 10/2015 > resolved 09/04/2017   Although even in retrospect it may not be clear the ACEi contributed to the pt's symptoms,  Pt improved off them and adding them back at this point or in the future would risk confusion in interpretation of non-specific respiratory symptoms to which this patient is prone  ie  Better not to muddy the waters here.   Follow up per Primary Care planned

## 2017-09-04 NOTE — Progress Notes (Signed)
PFT done today. 

## 2017-09-04 NOTE — Patient Instructions (Addendum)
Weight control is simply a matter of calorie balance which needs to be tilted in your favor by eating less and exercising more.  To get the most out of exercise, you need to be continuously aware that you are short of breath, but never out of breath, for 30 minutes daily. As you improve, it will actually be easier for you to do the same amount of exercise  in  30 minutes so always push to the level where you are short of breath.     If you are satisfied with your treatment plan,  let your doctor know and he/she can either refill your medications or you can return here when your prescription runs out.     If in any way you are not 100% satisfied,  please tell us.  If 100% better, tell your friends!  Pulmonary follow up is as needed

## 2017-09-04 NOTE — Assessment & Plan Note (Signed)
PFTs 09/04/2017 with ERV 9%    Body mass index is 36.47 kg/m.  -  trending up TSH    1/91/9   = 2.88    Contributing to gerd risk/ doe/reviewed the need and the process to achieve and maintain neg calorie balance > defer f/u primary care including intermittently monitoring thyroid status

## 2017-10-16 ENCOUNTER — Ambulatory Visit: Payer: BLUE CROSS/BLUE SHIELD | Admitting: Internal Medicine

## 2017-10-16 ENCOUNTER — Encounter: Payer: Self-pay | Admitting: Internal Medicine

## 2017-10-16 VITALS — BP 126/79 | HR 73 | Ht 70.5 in | Wt 253.6 lb

## 2017-10-16 DIAGNOSIS — E782 Mixed hyperlipidemia: Secondary | ICD-10-CM | POA: Diagnosis not present

## 2017-10-16 DIAGNOSIS — I2510 Atherosclerotic heart disease of native coronary artery without angina pectoris: Secondary | ICD-10-CM

## 2017-10-16 DIAGNOSIS — I10 Essential (primary) hypertension: Secondary | ICD-10-CM | POA: Diagnosis not present

## 2017-10-16 LAB — LIPID PANEL
CHOL/HDL RATIO: 2.7 ratio (ref 0.0–5.0)
Cholesterol, Total: 125 mg/dL (ref 100–199)
HDL: 47 mg/dL (ref >39)
LDL Calculated: 57 mg/dL (ref 0–99)
TRIGLYCERIDES: 103 mg/dL (ref 0–149)
VLDL CHOLESTEROL CAL: 21 mg/dL (ref 5–40)

## 2017-10-16 NOTE — Progress Notes (Signed)
Patient ID: Patrick CockayneRicky D Wilkins, male   DOB: 1955/11/07, 62 y.o.   MRN: [id number]    Date:  10/16/2017   ID:  Patrick CockayneRicky D Wilkins, DOB 1955/11/07, MRN [id number]  PCP:  Patrick BlossomMorgan, Melvin K II, MD  Primary Cardiologist:  Patrick Wilkins  No chief complaint on file. Weight gain   History of Present Illness: Patrick CockayneRicky D Carda is a 62 y.o. male history of tobacco abuse but no known cardiac history presented 10/30/15 with chest pain. Reports starting around 6pm onset of 10/10 pressure in midchest with diaphoresis and SOB. Symptoms improved but not resolved with SL NG x3, also received ASA in the field and 6mg  of IV morphine. Pain down from 10/10 to 5/10 but not resolved. Brought in as code STEMI by EMS. Initial troponin of 0.11.   Cath with multivessel CAD. S/p PTCA and DES of total occlusion of mid LAD. Continued ASA/ brilinta/ statin/ lisinopril and coreg. Continue to have mild chest discomfort. Due to elevated BP, lisinopril dose was adjusted. The patient was given IV lasix for mild right basilar crackles and EF of 40-45% on cath. Echo showed improved EF to 50-55%, grade 2 DD, trace MR and TR. Dyspnea has resolved with lasix, felt he will not need lasix at home. Changed protonix back to omeprazole at discharge. The patient takes Celebrex for DDD, advised to avoid celebrex/ NSAIDs if able.  Patient presents for posthospital follow-up. He reports doing well. He is walking 15 minutes twice a day without any problems. He is taking Tylenol instead of Celebrex for his back. He has requested tramadol which helped in the hospital. He is taking all medications as prescribed. Did state that he was having some quivering in his heart/nervousness 1 day or so he took his clonazepam and that resolved it.   The patient currently denies nausea, vomiting, fever, chest pain, shortness of breath, orthopnea, dizziness, PND, cough, congestion, abdominal pain, hematochezia, melena, lower extremity edema, claudication.  12/08/2015  [name]  returns today for follow-up. He was seen after his recent acute MI by 2 physician assistants. The second office visit was for acute chest pain. There was concern as he had some moderate lesions on That were not revascularized that he may be having additional ischemia. He was admitted and ruled out for MI. He underwent nuclear stress testing which was negative for ischemia. He was then discharged. He says he has had no further chest pain symptoms. He is on low-dose imdur 15 mg daily. He's been compliant with aspirin and Brilinta and is exercising on his own. He says disease consider cardiac rehabilitation however it would cost him over $100 per session which is quite expensive. He may likely be able to do this exercise on his own. Today's asking if he could use Bactrim as needed for what sounds like recurrent abscesses. There is no issue that I have with it. He does get some dizziness with quick position changes. He denies any further chest pain.  01/06/2016  Patrick Wilkins returns today for follow-up. I received several messages from cardiac rehabilitation regarding ongoing chest pain symptoms. These have occurred with exercise, particularly cardiac rehabilitation and with sexual activity. He has stopped taking isosorbide due to headaches and intolerance to that medication and noted that it was not particularly helpful with chest pain. 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 In fact he was tearful in the office today and says that he is considered switching careers  although does not know what he would do at this late life.  01/27/2016  Patrick Wilkins was seen today in follow-up. He reports his chest pain is resolved. Unfortunately he's having problems with constipation and has recently had some mild rectal blood after bowel movements. He was determined to have hemorrhoids by his PCP and placed on Proctocort. The bleeding may be more significant due to his antiplatelet  therapy. He continues to have hard stools and would benefit from a stool softener and occasional use of relaxer constipation. He does take Metamucil.  04/14/2016  Patrick Wilkins returns today for follow-up. He is feeling much better.He is managed to lose a significant amount of was down to 200  At one point he was up to 245 pounds. He's exercising regularly and denies any chest pain. He is occasionally having dizziness. I have reviewed his home blood pressure list which was extensive and indicated that he at times has blood pressures around the 100 systolic or even in the 90s. Given his weight loss I feel that he is currently being over treated with medication. He is interested in a repeat echocardiogram to see if his LV function has normalized after anterior MI. EF was low at 50-55%. He had recent lab work from his primary care provider which showed normal renal function and a cholesterol of 91, triglycerides 96, HDL 39 and LDL 33. This represents excellent cholesterol control. He's also concerned about the cost of ranolazine, a medicine are not sure that he needs to stay on long-term. He reports his constipation and hemorrhoidal bleeding has resolved.  10/17/2016  Patrick Wilkins was seen today in follow-up. He says he still gets some occasional feeling of bruising in the chest which comes and goes but is not the cervix associated with exertion or relieved by rest. I don't believe this is angina. He was intolerant of nitrates and Ranexa but is done well with adjustments including carvedilol and supplementation with L-arginine and coQ10. He is on aspirin and Brilinta and is coming up on one year since his prior stent on 10/30/2015. Recently he's gained a little bit of weight to do less activity although blood pressure is still a goal. His cholesterol is well treated on atorvastatin with LDL-C of 33.  02/27/2017  Patrick Wilkins returns today for follow-up. He continues to be under significant amount of stress. 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 Unfortunately he's gained another 18 pounds since her last saw him. He's been particularly immobile and is having problems with right knee pain. 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 He potentially will need partial knee replacement. He denies any cardiac chest pain or worsening shortness of breath. Recent lipid profile from his PCP and June 2018 showed total cluster 106, triglycerides 80, HDL 39 and LDL 51.  10/16/2017  Patrick Wilkins returns today for follow-up.  He is concerned about some recent weight gain.  Overall though his weight is only up about 3 pounds since December.  Blood pressure is well controlled at 126/79.  Weight when I saw him last was 233, however.  He attributes this to less physical activity.  He is recently been having some orthopedic issues.  EKG today shows sinus rhythm at 74.  He denies any chest pain or worsening shortness of breath.  Wt Readings from Last 3 Encounters:  10/16/17 253 lb 9.6 oz (115 kg)  09/04/17 256  lb (116.1 kg)  06/23/17 250 lb 12.8 oz (113.8 kg)     Past Medical History:  Diagnosis Date  . Anxiety   . Arthritis    "right knee, back" (11/24/2015)  . CAD (coronary artery disease)    a. NSTEMI 10/30/15 s/p PTCA and DES of total occusion of  mid LAD. ostial and proximal LAD 50% stenosis, 50-70% PDA stenosis, and 70% stenosis of the first obtuse marginal.   . CHF (congestive heart failure) (HCC) 10/2015  . Childhood asthma   . Complication of anesthesia 1958   "died on the table when they did OR to dx TB"  . GERD (gastroesophageal reflux disease)   . Hyperlipidemia   . Hypertension   . Kidney stones   . NSTEMI (non-ST elevated myocardial infarction) (HCC) 10/2015  . Tuberculosis 1958    Current Outpatient Medications  Medication Sig Dispense Refill  . allopurinol (ZYLOPRIM) 100 MG tablet Take 100 mg by mouth daily.     Marland Kitchen aspirin 81 MG tablet Take 81 mg by mouth daily as needed for pain. For chest pain. Given by EMS    . atorvastatin (LIPITOR) 80 MG tablet TAKE 1 TABLET (80 MG TOTAL) BY MOUTH DAILY AT 6 PM. 30 tablet 10  . baclofen (LIORESAL) 10 MG tablet Take 10 mg by mouth 3 (three) times daily.    . carvedilol (COREG) 6.25 MG tablet Take 1 tablet (6.25 mg total) by mouth 2 (two) times daily with a meal. 180 tablet 3  . clonazePAM (KLONOPIN) 0.5 MG tablet Take 0.5 mg by mouth 2 (two) times daily as needed for anxiety.    Marland Kitchen dextromethorphan-guaiFENesin (MUCINEX DM) 30-600 MG 12hr tablet Take 1 tablet by mouth 2 (two) times daily as needed for cough.    . docusate sodium (COLACE) 100 MG capsule Take 1 capsule by mouth as directed.    . Glucosamine-Chondroitin (GLUCOSAMINE CHONDR COMPLEX PO) Take 1 each by mouth daily.    . Lactobacillus-Inulin (CULTURELLE DIGESTIVE HEALTH PO) Take 1 capsule by mouth daily.    Marland Kitchen losartan (COZAAR) 50 MG tablet Take 1 tablet (50 mg total) by mouth daily. 30 tablet 11  . Multiple Vitamins-Minerals (CENTRUM SILVER PO) Take 1 tablet by mouth daily.    . nitroGLYCERIN (NITROSTAT) 0.4 MG SL tablet Place 1 tablet (0.4 mg total) under the tongue every 5 (five) minutes as needed for chest pain. Given by EMS 25 tablet 12  . olopatadine (PATANOL) 0.1 % ophthalmic solution Place 1 drop into both eyes as needed for allergies.    . potassium citrate (UROCIT-K) 10 MEQ (1080 MG) SR tablet Take 1 tablet (10 mEq total) by mouth 2 (two) times daily.    Marland Kitchen PROAIR HFA 108 (90 Base) MCG/ACT inhaler Inhale 2 puffs into the lungs every 6 (six) hours as needed.     . psyllium (METAMUCIL) 58.6 % packet Take 1 packet by mouth at bedtime.    . ranitidine (ZANTAC) 150 MG tablet Take 75 mg by mouth 2 (two) times daily.    . traMADol (ULTRAM) 50 MG tablet Take 50 mg by mouth every 8 (eight) hours as needed.   0  . vitamin C (ASCORBIC ACID) 500 MG tablet Take 500 mg by mouth daily.    Marland Kitchen azelastine (ASTELIN) 0.1  % nasal spray Place 2 sprays into both nostrils 2 (two) times daily as needed.  2   No current facility-administered medications for this visit.     Allergies:    Allergies  Allergen  Reactions  . Other Anaphylaxis    [place] nuts: Stop breathing   . Tyloxapol Itching and Nausea And Vomiting    Itching and vomiting  Tolerates other forms of oxycodone    Social History:  The patient  reports that he has quit smoking. His smoking use included cigarettes. He has a 5.00 pack-year smoking history. He has quit using smokeless tobacco. His smokeless tobacco use included chew. He reports that he drinks alcohol. He reports that he does not use drugs.   Family history:   Family History  Problem Relation Age of Onset  . Hypertension Maternal Grandmother   . Hypertension Maternal Grandfather   . Heart attack Paternal Uncle     ROS:  Pertinent items noted in HPI and remainder of comprehensive ROS otherwise negative.  PHYSICAL EXAM: VS:  BP 126/79   Pulse 73   Ht 5' 10.5" (1.791 m)   Wt 253 lb 9.6 oz (115 kg)   SpO2 95% Comment: on room air  BMI 35.87 kg/m  General appearance: alert, no distress and mildly obese Neck: no carotid bruit, no JVD and thyroid not enlarged, symmetric, no tenderness/mass/nodules Lungs: clear to auscultation bilaterally Heart: regular rate and rhythm, S1, S2 normal, no murmur, click, rub or gallop Abdomen: soft, non-tender; bowel sounds normal; no masses,  no organomegaly Extremities: extremities normal, atraumatic, no cyanosis or edema Pulses: 2+ and symmetric Skin: Skin color, texture, turgor normal. No rashes or lesions Neurologic: Grossly normal Psych: Pleasant  EKG:   Normal sinus rhythm at 74-personally reviewed  ASSESSMENT: 1. CAD status post anterior NSTEMI - DES to the mid-LAD with a 2.75 mm x 18 mm DES (10/2015) 2. Ischemic cardiomyopathy - EF 40-45% by cath (at 50% by echo) 3. Hypertension 4. Dyslipidemia 5. Obesity with recent weight  gain  PLAN:  1. Mr. Brannan continues to do well without any recurrent chest pain.  LVEF had improved somewhat after an STEMI.  Blood pressure is well controlled and cholesterol has been at goal.  Continues to have some weight gain and difficulty with knee pain.  We talked about other exercises he may be able to do without over stressing his leg.  He does need to work on reducing calorie intake since he is less physically active.  Follow-up in 1 year.  Chrystie Nose, MD, [facility], FACP  [place]  [facility]  Medical Director of the [facility]  Card[facility]ovascular [facility] Reduction Clinic Diplomate of the American Board of Clinical Lipidology Attending Cardiologist  Direct Dial: [phone]  Fax: [phone]  Website:  [email or web address]

## 2017-10-16 NOTE — Patient Instructions (Addendum)
Dr. Despina HickAlusio   Your physician recommends that you return for lab work FASTING to check cholesterol.   Your physician wants you to follow-up in: ONE YEAR with Dr. Rennis GoldenHilty. You will receive a reminder letter in the mail two months in advance. If you don't receive a letter, please call our office to schedule the follow-up appointment.

## 2017-10-17 ENCOUNTER — Encounter: Payer: Self-pay | Admitting: Internal Medicine

## 2017-10-18 ENCOUNTER — Encounter: Payer: Self-pay | Admitting: Internal Medicine

## 2018-01-04 ENCOUNTER — Telehealth: Payer: Self-pay | Admitting: Internal Medicine

## 2018-01-04 NOTE — Telephone Encounter (Signed)
New message:      Pt is calling and states he just left his PCP's office and his physician states he has neuropathy and thinks he may have sleep apnea. Pt states his BP was a little low today as well.

## 2018-01-04 NOTE — Telephone Encounter (Signed)
Returned call to patient. He states his PCP is ordering tests to evaluate neuropathy & sleep apnea. He reports his BP was really low low (97/56). He reports burning is in his feet and leg pain. He also reports getting really tired in the afternoon & not getting good sleep.   He wanted to make MD aware and see if he had any additional recommendations  Patient has PA f/up on 01/23/18

## 2018-01-04 NOTE — Telephone Encounter (Signed)
Thanks .Marland Kitchen. May need meds adjusted when he sees APP in follow-up.  Dr HRexene Edison

## 2018-01-05 NOTE — Telephone Encounter (Signed)
Patient called with MD advice. Suggested that he track BP and HR at home, since he reported a low BP reading at his PCP office, and bring readings to his PA appointment

## 2018-01-13 ENCOUNTER — Other Ambulatory Visit: Payer: Self-pay | Admitting: Internal Medicine

## 2018-01-23 ENCOUNTER — Ambulatory Visit: Payer: BLUE CROSS/BLUE SHIELD | Admitting: Physician Assistant

## 2018-01-23 ENCOUNTER — Encounter: Payer: Self-pay | Admitting: Physician Assistant

## 2018-01-23 VITALS — BP 122/78 | HR 71 | Ht 70.5 in | Wt 263.0 lb

## 2018-01-23 DIAGNOSIS — I10 Essential (primary) hypertension: Secondary | ICD-10-CM

## 2018-01-23 DIAGNOSIS — R079 Chest pain, unspecified: Secondary | ICD-10-CM

## 2018-01-23 DIAGNOSIS — E785 Hyperlipidemia, unspecified: Secondary | ICD-10-CM | POA: Diagnosis not present

## 2018-01-23 DIAGNOSIS — I2510 Atherosclerotic heart disease of native coronary artery without angina pectoris: Secondary | ICD-10-CM | POA: Diagnosis not present

## 2018-01-23 NOTE — Progress Notes (Signed)
Cardiology Office Note    Date:  01/25/2018   ID:  Patrick Wilkins, DOB [birth year], MRN [id number]  PCP:  [name], MD  Cardiologist:  Dr. Rennis Golden  Chief Complaint  Patient presents with  . Follow-up    pt states he gets up every morning feeling tired and sleepy--did a home sleep study last night, thinks he may have sleep apnea. States he is turning that in today to be read. Also c/o SOB--happens randomly; occasional dizziness as well. Pt also states when he does work outside, he sweats under his glasses around his eyes; states he has been walking more and has noticed some swelling in his feet--dad has peripheral neuropathy, thinks it may be this but not sure.    History of Present Illness:  Patrick Wilkins is a 62 y.o. male with PMH of tobacco abuse, CAD, CHF, hypertension, hyperlipidemia, and history of tuberculosis.  Previous cardiac catheterization in April 2017 in the setting of NSTEMI showed a 70% ostial OM1 lesion, 60% mid left circumflex lesion, 70% RPDA lesion, 75% ostial D1 lesion, 60% ostial LAD lesion, 45% left main lesion, 100% mid LAD occlusion treated with 2.75 x 18 mm Resolute DES postdilated to 3.0 mm.  Myoview obtained on 11/25/2015 for recurrent chest discomfort in the setting of moderate residual disease was low risk study, fixed small apical perfusion defect attributed to attenuation, otherwise no ischemia.  Last echocardiogram obtained on 04/26/2016 showed EF 55 to 60%, trace MR and TR.  He had a history of intolerance to isosorbide due to headache.  He was last seen by Dr. Rennis Golden on 10/16/2017, he was doing well at the time.  It was recommended for him to follow-up in 1 year.  Patient presents today for cardiology office visit.  He has been having some issue with his right knee and the likely will need knee replacement in September.  Due to his knee issue, he has not been active and has gained a lot of weight.  He has been noticing some exertional chest tightness  recently, however does not know if this is related to the weight gain or related to his underlying coronary artery issues.  He does not have any lower extremity edema, orthopnea or PND.  He has a chronic history of snoring and chest underwent home sleep study managed by pulmonology service.  He also complaining of daytime somnolence consistent with obstructive sleep apnea as well.  During one of his office visit with his family medicine doctor on 01/04/2018, he was noted to have a blood pressure of 98/57, however he says his blood pressure cuff was not very tight at the time and felt the number may not be accurate.  Since then his home blood pressure has been ranging in the 110-140s.  His blood pressure today is also quite stable, I will continue him on the current blood pressure medication.   Past Medical History:  Diagnosis Date  . Anxiety   . Arthritis    "right knee, back" (11/24/2015)  . CAD (coronary artery disease)    a. NSTEMI 10/30/15 s/p PTCA and DES of total occusion of  mid LAD. ostial and proximal LAD 50% stenosis, 50-70% PDA stenosis, and 70% stenosis of the first obtuse marginal.   . CHF (congestive heart failure) (HCC) 10/2015  . Childhood asthma   . Complication of anesthesia 1958   "died on the table when they did OR to dx TB"  . GERD (gastroesophageal reflux disease)   .  Hyperlipidemia   . Hypertension   . Kidney stones   . NSTEMI (non-ST elevated myocardial infarction) (HCC) 10/2015  . Tuberculosis 1958    Past Surgical History:  Procedure Laterality Date  . ABDOMINAL EXPLORATION SURGERY  1958   "discovered I had TB"  . ABDOMINAL HERNIA REPAIR  ~ 2014  . CARDIAC CATHETERIZATION N/A 10/30/2015   Procedure: Left Heart Cath and Coronary Angiography;  Surgeon: Lyn Records, MD;  Location[facility] INVASIVE CV LAB;  Service: Cardiovascular;  Laterality: N/A;  . CARDIAC CATHETERIZATION N/A 10/30/2015   Procedure: Coronary Stent Intervention;  Surgeon: Lyn Records, MD;  Location: [facility]  INVASIVE CV LAB;  Service: Cardiovascular;  Laterality: N/A;  . CYSTOSCOPY W/ STONE MANIPULATION  ~ 2002 X 2  . HERNIA REPAIR    . KNEE ARTHROSCOPY Right ~ 2012   "ACL repaired"  . LAPAROSCOPIC CHOLECYSTECTOMY  ~ 2015    Current Medications: Outpatient Medications Prior to Visit  Medication Sig Dispense Refill  . allopurinol (ZYLOPRIM) 100 MG tablet Take 100 mg by mouth daily.    Marland Kitchen aspirin 81 MG tablet Take 81 mg by mouth daily as needed for pain. For chest pain. Given by EMS    . atorvastatin (LIPITOR) 80 MG tablet TAKE 1 TABLET BY MOUTH DAILY AT 6PM. 90 tablet 3  . azelastine (ASTELIN) 0.1 % nasal spray Place 2 sprays into both nostrils 2 (two) times daily as needed.  2  . baclofen (LIORESAL) 10 MG tablet Take 10 mg by mouth 3 (three) times daily.    . carvedilol (COREG) 6.25 MG tablet Take 1 tablet (6.25 mg total) by mouth 2 (two) times daily with a meal. 180 tablet 3  . clonazePAM (KLONOPIN) 0.5 MG tablet Take 0.5 mg by mouth 2 (two) times daily as needed for anxiety.    Marland Kitchen dextromethorphan-guaiFENesin (MUCINEX DM) 30-600 MG 12hr tablet Take 1 tablet by mouth 2 (two) times daily as needed for cough.    . docusate sodium (COLACE) 100 MG capsule Take 1 capsule by mouth as directed.    . Glucosamine-Chondroitin (GLUCOSAMINE CHONDR COMPLEX PO) Take 1 each by mouth daily.    . Lactobacillus-Inulin (CULTURELLE DIGESTIVE HEALTH PO) Take 1 capsule by mouth daily.    Marland Kitchen losartan (COZAAR) 50 MG tablet Take 1 tablet (50 mg total) by mouth daily. 30 tablet 11  . Multiple Vitamins-Minerals (CENTRUM SILVER PO) Take 1 tablet by mouth daily.    . nitroGLYCERIN (NITROSTAT) 0.4 MG SL tablet Place 1 tablet (0.4 mg total) under the tongue every 5 (five) minutes as needed for chest pain. Given by EMS 25 tablet 12  . olopatadine (PATANOL) 0.1 % ophthalmic solution Place 1 drop into both eyes as needed for allergies.    . potassium citrate (UROCIT-K) 10 MEQ (1080 MG) SR tablet Take 1 tablet (10 mEq total) by  mouth 2 (two) times daily.    Marland Kitchen PROAIR HFA 108 (90 Base) MCG/ACT inhaler Inhale 2 puffs into the lungs every 6 (six) hours as needed.     . psyllium (METAMUCIL) 58.6 % packet Take 1 packet by mouth at bedtime.    . ranitidine (ZANTAC) 150 MG tablet Take 75 mg by mouth 2 (two) times daily.    . traMADol (ULTRAM) 50 MG tablet Take 50 mg by mouth every 8 (eight) hours as needed.   0  . vitamin C (ASCORBIC ACID) 500 MG tablet Take 500 mg by mouth daily.     No facility-administered medications prior to visit.  Allergies:   Other and Tyloxapol   Social History   Socioeconomic History  . Marital status: Married    Spouse name: Not on file  . Number of children: Not on file  . Years of education: Not on file  . Highest education level: Not on file  Occupational History  . Not on file  Social Needs  . Financial resource strain: Not on file  . Food insecurity:    Worry: Not on file    Inability: Not on file  . Transportation needs:    Medical: Not on file    Non-medical: Not on file  Tobacco Use  . Smoking status: Former Smoker    Packs/day: 1.00    Years: 5.00    Pack years: 5.00    Types: Cigarettes  . Smokeless tobacco: Former Neurosurgeon    Types: Chew  . Tobacco comment: "quit smoking cigarettes & chewing tobacco in my 20s"  Substance and Sexual Activity  . Alcohol use: Yes    Comment: 11/24/2015 "drank a little bit in my 20s"  . Drug use: No  . Sexual activity: Not on file  Lifestyle  . Physical activity:    Days per week: Not on file    Minutes per session: Not on file  . Stress: Not on file  Relationships  . Social connections:    Talks on phone: Not on file    Gets together: Not on file    Attends religious service: Not on file    Active member of club or organization: Not on file    Attends meetings of clubs or organizations: Not on file    Relationship status: Not on file  Other Topics Concern  . Not on file  Social History Narrative  . Not on file      Family History:  The patient's family history includes Heart attack in his paternal uncle; Hypertension in his maternal grandfather and maternal grandmother.   ROS:   Please see the history of present illness.    ROS All other systems reviewed and are negative.   PHYSICAL EXAM:   VS:  BP 122/78   Pulse 71   Ht 5' 10.5" (1.791 m)   Wt 263 lb (119.3 kg)   BMI 37.20 kg/m    GEN: Well nourished, well developed, in no acute distress  HEENT: normal  Neck: no JVD, carotid bruits, or masses Cardiac: RRR; no murmurs, rubs, or gallops,no edema  Respiratory:  clear to auscultation bilaterally, normal work of breathing GI: soft, nontender, nondistended, + BS MS: no deformity or atrophy  Skin: warm and dry, no rash Neuro:  Alert and Oriented x 3, Strength and sensation are intact Psych: euthymic mood, full affect  Wt Readings from Last 3 Encounters:  01/23/18 263 lb (119.3 kg)  10/16/17 253 lb 9.6 oz (115 kg)  09/04/17 256 lb (116.1 kg)      Studies/Labs Reviewed:   EKG:  EKG is not ordered today.    Recent Labs: No results found for requested labs within last 8760 hours.   Lipid Panel    Component Value Date/Time   CHOL 125 10/16/2017 0919   TRIG 103 10/16/2017 0919   HDL 47 10/16/2017 0919   CHOLHDL 2.7 10/16/2017 0919   CHOLHDL 4.8 11/01/2015 0504   VLDL 35 11/01/2015 0504   LDLCALC 57 10/16/2017 0919    Additional studies/ records that were reviewed today include:   Cath 10/30/2015 Conclusion   1. Ost 1st Mrg to 1st  Mrg lesion, 70% stenosed. 2. Mid Cx to Dist Cx lesion, 60% stenosed. 3. RPDA lesion, 70% stenosed. 4. Prox RCA to Dist RCA lesion, 25% stenosed. 5. Ost 1st Diag lesion, 75% stenosed. 6. Ost LAD to Prox LAD lesion, 60% stenosed. 7. LM lesion, 45% stenosed. 8. Mid LAD to Dist LAD lesion, 100% stenosed. Post intervention, there is a 0% residual stenosis.    Acute coronary syndrome with presentation of non-ST elevation myocardial infarction noted by  ongoing chest pain and elevated troponins. There were no diagnostic ischemic EKG changes noted.  Total occlusion of the mid LAD well collateralized from the right coronary explaining the absence of EKG changes.  Successful PTCA and stenting of the mid LAD from 100% to 0% with TIMI grade 3 flow using a resolute 2.75 mm x 18 mm long stent postdilated to 3.0 mm.  There is ostial and proximal LAD 50% stenosis, 50-70% PDA stenosis, and 70% stenosis of the first obtuse marginal.  Apical akinesis with estimated EF 40-45% with elevated LVEDP consistent with acute combined systolic and diastolic heart failure.   RECOMMENDATIONS:  IV heparin 20 mg IV  Aspirin and Brilinta 12 months  Aggressive risk factor modification including high-intensity statin therapy, screening for diabetes, and aggressive blood pressure control.  Potential fast-track eligible for discharge at 48 hours.  Anticipate significant recovery of apical segment due to the presence of well formed collaterals.    Echo 04/26/2016 LV EF: 55% -   60% Study Conclusions  - Left ventricle: The cavity size was normal. Wall thickness was   normal. Systolic function was normal. The estimated ejection   fraction was in the range of 55% to 60%. Wall motion was normal;   there were no regional wall motion abnormalities. Left   ventricular diastolic function parameters were normal.  Impressions:  - Normal LV systolic and diastolic function; trace MR and TR.  ASSESSMENT:    1. Chest pain, unspecified type   2. Coronary artery disease involving native coronary artery of native heart without angina pectoris   3. Essential hypertension   4. Hyperlipidemia, unspecified hyperlipidemia type      PLAN:  In order of problems listed above:  1. Chest pain: Patient has been having some chest discomfort after increasing his activity recently.  He is not sure if his due to the recent weight gain or underlying coronary artery disease.   I will pursue Lexiscan Myoview.  He cannot keep up with a treadmill due to significant issue, he may end up needing knee replacement at some point.  If this Myoview is negative, he would be cleared for that as well.  2. CAD: Continue aspirin and Lipitor  3. Hypertension: Doing well at his office visit recently, his blood pressure was in the 90s, however he attributed to a loose blood pressure cuff.  His blood pressure is normal today  4. Hyperlipidemia: Continue on Lipitor 80 mg daily.  Last lipid panel obtained in April 2019 showed total cholesterol 125, triglyceride 103, HDL 47, LDL 57.  Very well controlled.    Medication Adjustments/Labs and Tests Ordered: Current medicines are reviewed at length with the patient today.  Concerns regarding medicines are outlined above.  Medication changes, Labs and Tests ordered today are listed in the Patient Instructions below. Patient Instructions  Medication Instructions:  Your physician recommends that you continue on your current medications as directed. Please refer to the Current Medication list given to you today.  Labwork: None   Testing/Procedures: Your physician  has requested that you have a lexiscan myoview. For further information please visit [email or web address]. Please follow instruction sheet, as given.  Follow-Up: Your physician recommends that you schedule a follow-up appointment in: 6-9 [email or web address] WITH DR HILTY  Any Other Special Instructions Will Be Listed Below (If Applicable).  If you need a refill on your cardiac medications before your next appointment, please call your pharmacy.     Ramond DialSigned, Myka Lukins, [place]  01/25/2018 1:37 PM    [facility] [address], [place], [place]  [address] Phone: [phone]; Fax: [phone]

## 2018-01-23 NOTE — Patient Instructions (Signed)
Medication Instructions:  Your physician recommends that you continue on your current medications as directed. Please refer to the Current Medication list given to you today.  Labwork: None   Testing/Procedures: Your physician has requested that you have a lexiscan myoview. For further information please visit [email or web address]. Please follow instruction sheet, as given.  Follow-Up: Your physician recommends that you schedule a follow-up appointment in: 6-9 MONTHS WITH DR HILTY  Any Other Special Instructions Will Be Listed Below (If Applicable).  If you need a refill on your cardiac medications before your next appointment, please call your pharmacy.

## 2018-01-31 ENCOUNTER — Telehealth (HOSPITAL_COMMUNITY): Payer: Self-pay

## 2018-01-31 NOTE — Telephone Encounter (Signed)
Encounter complete. 

## 2018-02-02 ENCOUNTER — Ambulatory Visit (HOSPITAL_COMMUNITY)
Admission: RE | Admit: 2018-02-02 | Discharge: 2018-02-02 | Disposition: A | Discharge status: Home / Self Care | Payer: BLUE CROSS/BLUE SHIELD | Source: Ambulatory Visit | Attending: Cardiology | Admitting: Cardiology

## 2018-02-02 DIAGNOSIS — R079 Chest pain, unspecified: Secondary | ICD-10-CM | POA: Diagnosis not present

## 2018-02-02 LAB — MYOCARDIAL PERFUSION IMAGING
CHL CUP NUCLEAR SDS: 3
CHL CUP RESTING HR STRESS: 63 {beats}/min
CSEPPHR: 86 {beats}/min
LV dias vol: 100 mL (ref 62–150)
LV sys vol: 45 mL
SRS: 2
SSS: 5
TID: 1.41

## 2018-02-02 MED ORDER — REGADENOSON 0.4 MG/5ML IV SOLN
0.4000 mg | Freq: Once | INTRAVENOUS | Status: AC
  Administered 2018-02-02: 0.4 mg via INTRAVENOUS

## 2018-02-02 MED ORDER — TECHNETIUM TC 99M TETROFOSMIN IV KIT
10.8000 | PACK | Freq: Once | INTRAVENOUS | Status: AC | PRN
  Administered 2018-02-02: 10.8 via INTRAVENOUS
  Filled 2018-02-02: qty 11

## 2018-02-02 MED ORDER — TECHNETIUM TC 99M TETROFOSMIN IV KIT
31.8000 | PACK | Freq: Once | INTRAVENOUS | Status: AC | PRN
  Administered 2018-02-02: 31.8 via INTRAVENOUS
  Filled 2018-02-02: qty 32

## 2018-02-07 ENCOUNTER — Telehealth: Payer: Self-pay

## 2018-02-07 ENCOUNTER — Telehealth: Payer: Self-pay | Admitting: Physician Assistant

## 2018-02-07 NOTE — Telephone Encounter (Signed)
New Message   Pt calling to check the results of his stress test. Please call

## 2018-02-07 NOTE — Telephone Encounter (Signed)
Notes recorded by Lucita FerraraYoung, Tierica T, CMA on 02/07/2018 at 3:06 PM EDT Patient notified directly. ------  Notes recorded by Azalee CourseMeng, Hao, PA on 02/06/2018 at 10:10 AM EDT Low risk stress test, but did not show significant reversible blockage, continue to increase activity.

## 2018-02-07 NOTE — Telephone Encounter (Signed)
I spoke with patient and he stated that when he had his heart attack 2 years ago and was told he had several small blockages and he wanted to know how are those blockages doing and if his medication is helping them shrink. Do he need to be concerned and what should he be doing about them.  I told him I would have you give him a call. He voiced understanding.

## 2018-02-07 NOTE — Telephone Encounter (Signed)
-----   Message from The HillsHao Meng, [place]PA sent at 02/06/2018 10:10 AM EDT ----- Low risk stress test, but did not show significant reversible blockage, continue to increase activity.

## 2018-02-09 NOTE — Telephone Encounter (Signed)
I have called and answered his question. Also informed him to have his surgeon send over the clearance request once he is ready to get the knee done, I will clear him for the surgery given the low risk stress test

## 2018-03-19 ENCOUNTER — Other Ambulatory Visit: Payer: Self-pay | Admitting: Internal Medicine

## 2018-06-11 ENCOUNTER — Other Ambulatory Visit: Payer: Self-pay | Admitting: Internal Medicine

## 2018-06-13 ENCOUNTER — Other Ambulatory Visit: Payer: Self-pay | Admitting: Internal Medicine

## 2018-06-22 ENCOUNTER — Telehealth: Payer: Self-pay | Admitting: Internal Medicine

## 2018-06-22 MED ORDER — LOSARTAN POTASSIUM 50 MG PO TABS: 50.0000 mg | ORAL_TABLET | Freq: Every day | ORAL | 5 refills | Status: DC

## 2018-06-22 NOTE — Telephone Encounter (Signed)
Call made to patient, confirmed medication and pharmacy. Refill sent. Voiced understanding. Nothing further is needed at this time.  

## 2018-07-30 ENCOUNTER — Encounter: Payer: Self-pay | Admitting: Internal Medicine

## 2018-07-30 ENCOUNTER — Ambulatory Visit: Payer: BLUE CROSS/BLUE SHIELD | Admitting: Internal Medicine

## 2018-07-30 ENCOUNTER — Encounter (INDEPENDENT_AMBULATORY_CARE_PROVIDER_SITE_OTHER): Payer: Self-pay

## 2018-07-30 VITALS — BP 110/72 | HR 76 | Ht 70.5 in | Wt 273.4 lb

## 2018-07-30 DIAGNOSIS — E669 Obesity, unspecified: Secondary | ICD-10-CM

## 2018-07-30 DIAGNOSIS — I10 Essential (primary) hypertension: Secondary | ICD-10-CM | POA: Diagnosis not present

## 2018-07-30 DIAGNOSIS — E782 Mixed hyperlipidemia: Secondary | ICD-10-CM

## 2018-07-30 DIAGNOSIS — G4733 Obstructive sleep apnea (adult) (pediatric): Secondary | ICD-10-CM

## 2018-07-30 DIAGNOSIS — I2510 Atherosclerotic heart disease of native coronary artery without angina pectoris: Secondary | ICD-10-CM

## 2018-07-30 MED ORDER — NITROGLYCERIN 0.4 MG SL SUBL: 0.4000 mg | SUBLINGUAL_TABLET | SUBLINGUAL | 3 refills | Status: AC | PRN

## 2018-07-30 NOTE — Patient Instructions (Signed)
Medication Instructions:  Continue current medications  If you need a refill on your cardiac medications before your next appointment, please call your pharmacy.  Labwork: Lipid HERE IN OUR OFFICE AT [facility]  You will need to fast. DO NOT EAT OR DRINK PAST MIDNIGHT.       Take the provided lab slips with you to the lab for your blood draw.  When you have your labs (blood work) drawn today and your tests are completely normal, you will receive your results only by MyChart Message (if you have MyChart) -OR-  A paper copy in the mail.  If you have any lab test that is abnormal or we need to change your treatment, we will call you to review these results.  Testing/Procedures: None Ordered  Follow-Up: You will need a follow up appointment in 12 months.  Please call our office 2 months in advance to schedule this appointment.  You may see Chrystie Nose, MD or one of the following Advanced Practice Providers on your designated Care Team: [place], [place] . Micah Flesher, PA-C   At [facility], you and your health needs are our priority.  As part of our continuing mission to provide you with exceptional heart care, we have created designated Provider Care Teams.  These Care Teams include your primary Cardiologist (physician) and Advanced Practice Providers (APPs -  Physician Assistants and Nurse Practitioners) who all work together to provide you with the care you need, when you need it.  Thank you for choosing [facility] at [facility]!!

## 2018-07-30 NOTE — Progress Notes (Signed)
Patient ID: CHRLES SELLEY, male   DOB: Jul 08, 1956, 63 y.o.   MRN: [id number]    Date:  07/30/2018   ID:  Patrick Wilkins, DOB 02-16-1956, MRN [id number]  PCP:  [name], MD  Primary Cardiologist:  Hilty  No chief complaint on file. Weight gain   History of Present Illness: Patrick Wilkins is a 63 y.o. male history of tobacco abuse but no known cardiac history presented 10/30/15 with chest pain. Reports starting around 6pm onset of 10/10 pressure in midchest with diaphoresis and SOB. Symptoms improved but not resolved with SL NG x3, also received ASA in the field and 6mg  of IV morphine. Pain down from 10/10 to 5/10 but not resolved. Brought in as code STEMI by EMS. Initial troponin of 0.11.   Cath with multivessel CAD. S/p PTCA and DES of total occlusion of mid LAD. Continued ASA/ brilinta/ statin/ lisinopril and coreg. Continue to have mild chest discomfort. Due to elevated BP, lisinopril dose was adjusted. The patient was given IV lasix for mild right basilar crackles and EF of 40-45% on cath. Echo showed improved EF to 50-55%, grade 2 DD, trace MR and TR. Dyspnea has resolved with lasix, felt he will not need lasix at home. Changed protonix back to omeprazole at discharge. The patient takes Celebrex for DDD, advised to avoid celebrex/ NSAIDs if able.  Patient presents for posthospital follow-up. He reports doing well. He is walking 15 minutes twice a day without any problems. He is taking Tylenol instead of Celebrex for his back. He has requested tramadol which helped in the hospital. He is taking all medications as prescribed. Did state that he was having some quivering in his heart/nervousness 1 day or so he took his clonazepam and that resolved it.   The patient currently denies nausea, vomiting, fever, chest pain, shortness of breath, orthopnea, dizziness, PND, cough, congestion, abdominal pain, hematochezia, melena, lower extremity edema, claudication.  12/08/2015  Patrick Wilkins  returns today for follow-up. He was seen after his recent acute MI by 2 physician assistants. The second office visit was for acute chest pain. There was concern as he had some moderate lesions on That were not revascularized that he may be having additional ischemia. He was admitted and ruled out for MI. He underwent nuclear stress testing which was negative for ischemia. He was then discharged. He says he has had no further chest pain symptoms. He is on low-dose imdur 15 mg daily. He's been compliant with aspirin and Brilinta and is exercising on his own. He says disease consider cardiac rehabilitation however it would cost him over $100 per session which is quite expensive. He may likely be able to do this exercise on his own. Today's asking if he could use Bactrim as needed for what sounds like recurrent abscesses. There is no issue that I have with it. He does get some dizziness with quick position changes. He denies any further chest pain.  01/06/2016  Patrick Wilkins returns today for follow-up. I received several messages from cardiac rehabilitation regarding ongoing chest pain symptoms. These have occurred with exercise, particularly cardiac rehabilitation and with sexual activity. He has stopped taking isosorbide due to headaches and intolerance to that medication and noted that it was not particularly helpful with chest pain. 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 In fact he was tearful in the office today and says that he is considered switching careers  although does not know what he would do at this late life.  01/27/2016  Patrick Wilkins was seen today in follow-up. He reports his chest pain is resolved. Unfortunately he's having problems with constipation and has recently had some mild rectal blood after bowel movements. He was determined to have hemorrhoids by his PCP and placed on Proctocort. The bleeding may be more significant due to his antiplatelet  therapy. He continues to have hard stools and would benefit from a stool softener and occasional use of relaxer constipation. He does take Metamucil.  04/14/2016  Patrick Wilkins returns today for follow-up. He is feeling much better.He is managed to lose a significant amount of was down to 200  At one point he was up to 245 pounds. He's exercising regularly and denies any chest pain. He is occasionally having dizziness. I have reviewed his home blood pressure list which was extensive and indicated that he at times has blood pressures around the 100 systolic or even in the 90s. Given his weight loss I feel that he is currently being over treated with medication. He is interested in a repeat echocardiogram to see if his LV function has normalized after anterior MI. EF was low at 50-55%. He had recent lab work from his primary care provider which showed normal renal function and a cholesterol of 91, triglycerides 96, HDL 39 and LDL 33. This represents excellent cholesterol control. He's also concerned about the cost of ranolazine, a medicine are not sure that he needs to stay on long-term. He reports his constipation and hemorrhoidal bleeding has resolved.  10/17/2016  Patrick Wilkins was seen today in follow-up. He says he still gets some occasional feeling of bruising in the chest which comes and goes but is not the cervix associated with exertion or relieved by rest. I don't believe this is angina. He was intolerant of nitrates and Ranexa but is done well with adjustments including carvedilol and supplementation with L-arginine and coQ10. He is on aspirin and Brilinta and is coming up on one year since his prior stent on 10/30/2015. Recently he's gained a little bit of weight to do less activity although blood pressure is still a goal. His cholesterol is well treated on atorvastatin with LDL-C of 33.  02/27/2017  Patrick Wilkins returns today for follow-up. He continues to be under significant amount of stress. 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 Unfortunately he's gained another 18 pounds since her last saw him. He's been particularly immobile and is having problems with right knee pain. 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 He potentially will need partial knee replacement. He denies any cardiac chest pain or worsening shortness of breath. Recent lipid profile from his PCP and June 2018 showed total cluster 106, triglycerides 80, HDL 39 and LDL 51.  10/16/2017  Patrick Wilkins returns today for follow-up.  He is concerned about some recent weight gain.  Overall though his weight is only up about 3 pounds since December.  Blood pressure is well controlled at 126/79.  Weight when I saw him last was 233, however.  He attributes this to less physical activity.  He is recently been having some orthopedic issues.  EKG today shows sinus rhythm at 74.  He denies any chest pain or worsening shortness of breath.  07/30/2017  Patrick Wilkins is seen today in follow-up.  Unfortunately continues to gain weight.  He  is now up about another 10 pounds since the summer.  Unfortunately he was struggling with depression and a number of issues at his church and apparently was suicidal at one point.  He was on antidepressant medications but recently came off of them and things seem to have improved.  This probably was a related cause of his weight gain.  His lipid profile was well controlled 9 months ago however I suspect it is worse now given the weight gain.  He denies any chest pain though or worsening shortness of breath.  Physical activity is decreased.  He is now on BiPAP instead of CPAP therapy and does feel like he has had some improvement in his energy level.  For time being he was reportedly using energy drinks but I advised against that.  Wt Readings from Last 3 Encounters:  07/30/18 273 lb 6.4 oz (124 kg)  02/02/18 263 lb (119.3  kg)  01/23/18 263 lb (119.3 kg)     Past Medical History:  Diagnosis Date  . Anxiety   . Arthritis    "right knee, back" (11/24/2015)  . CAD (coronary artery disease)    a. NSTEMI 10/30/15 s/p PTCA and DES of total occusion of  mid LAD. ostial and proximal LAD 50% stenosis, 50-70% PDA stenosis, and 70% stenosis of the first obtuse marginal.   . CHF (congestive heart failure) (HCC) 10/2015  . Childhood asthma   . Complication of anesthesia 1958   "died on the table when they did OR to dx TB"  . GERD (gastroesophageal reflux disease)   . Hyperlipidemia   . Hypertension   . Kidney stones   . NSTEMI (non-ST elevated myocardial infarction) (HCC) 10/2015  . Tuberculosis 1958    Current Outpatient Medications  Medication Sig Dispense Refill  . allopurinol (ZYLOPRIM) 100 MG tablet Take 100 mg by mouth daily.    Marland Kitchen aspirin 81 MG tablet Take 81 mg by mouth daily as needed for pain. For chest pain. Given by EMS    . atorvastatin (LIPITOR) 80 MG tablet TAKE 1 TABLET BY MOUTH DAILY AT 6PM. 90 tablet 3  . azelastine (ASTELIN) 0.1 % nasal spray Place 2 sprays into both nostrils 2 (two) times daily as needed.  2  . carvedilol (COREG) 6.25 MG tablet Take 1 tablet (6.25 mg total) by mouth 2 (two) times daily with a meal. 180 tablet 3  . clonazePAM (KLONOPIN) 0.5 MG tablet Take 0.5 mg by mouth 2 (two) times daily as needed for anxiety.    Marland Kitchen dextromethorphan-guaiFENesin (MUCINEX DM) 30-600 MG 12hr tablet Take 1 tablet by mouth 2 (two) times daily as needed for cough.    . docusate sodium (COLACE) 100 MG capsule Take 1 capsule by mouth as directed.    . Famotidine (PEPCID PO) Take by mouth.    . gabapentin (NEURONTIN) 300 MG capsule Take 300 mg by mouth 3 (three) times daily.    . Glucosamine-Chondroitin (GLUCOSAMINE CHONDR COMPLEX PO) Take 1 each by mouth daily.    . Lactobacillus-Inulin (CULTURELLE DIGESTIVE HEALTH PO) Take 1 capsule by mouth daily.    Marland Kitchen lisinopril (PRINIVIL,ZESTRIL) 10 MG tablet TAKE  ONE-HALF (1/2) TABLET DAILY 45 tablet 6  . losartan (COZAAR) 50 MG tablet Take 1 tablet (50 mg total) by mouth daily. 30 tablet 5  . Multiple Vitamins-Minerals (CENTRUM SILVER PO) Take 1 tablet by mouth daily.    Marland Kitchen olopatadine (PATANOL) 0.1 % ophthalmic solution Place 1 drop into both eyes as needed for allergies.    Marland Kitchen  Polyethylene Glycol 3350 (MIRALAX PO) Take by mouth daily before supper.    . potassium citrate (UROCIT-K) 10 MEQ (1080 MG) SR tablet Take 1 tablet (10 mEq total) by mouth 2 (two) times daily.    Marland Kitchen PROAIR HFA 108 (90 Base) MCG/ACT inhaler Inhale 2 puffs into the lungs every 6 (six) hours as needed.     . psyllium (METAMUCIL) 58.6 % packet Take 1 packet by mouth at bedtime.    . ranitidine (ZANTAC) 150 MG tablet Take 75 mg by mouth 2 (two) times daily.    . tadalafil (CIALIS) 5 MG tablet Take 5 mg by mouth daily as needed for erectile dysfunction.    . traMADol (ULTRAM) 50 MG tablet Take 50 mg by mouth every 8 (eight) hours as needed.   0  . vitamin C (ASCORBIC ACID) 500 MG tablet Take 500 mg by mouth daily.    . nitroGLYCERIN (NITROSTAT) 0.4 MG SL tablet Place 1 tablet (0.4 mg total) under the tongue every 5 (five) minutes as needed for chest pain. Given by EMS (Patient not taking: Reported on 07/30/2018) 25 tablet 12   No current facility-administered medications for this visit.     Allergies:    Allergies  Allergen Reactions  . Other Anaphylaxis    [place] nuts: Stop breathing   . Tyloxapol Itching and Nausea And Vomiting    Itching and vomiting  Tolerates other forms of oxycodone    Social History:  The patient  reports that he has quit smoking. His smoking use included cigarettes. He has a 5.00 pack-year smoking history. He has quit using smokeless tobacco.  His smokeless tobacco use included chew. He reports current alcohol use. He reports that he does not use drugs.   Family history:   Family History  Problem Relation Age of Onset  . Hypertension Maternal  Grandmother   . Hypertension Maternal Grandfather   . Heart attack Paternal Uncle     ROS:  Pertinent items noted in HPI and remainder of comprehensive ROS otherwise negative.  PHYSICAL EXAM: VS:  BP 110/72   Pulse 76   Ht 5' 10.5" (1.791 m)   Wt 273 lb 6.4 oz (124 kg)   BMI 38.67 kg/m  General appearance: alert, no distress and mildly obese Neck: no carotid bruit, no JVD and thyroid not enlarged, symmetric, no tenderness/mass/nodules Lungs: clear to auscultation bilaterally Heart: regular rate and rhythm, S1, S2 normal, no murmur, click, rub or gallop Abdomen: soft, non-tender; bowel sounds normal; no masses,  no organomegaly Extremities: extremities normal, atraumatic, no cyanosis or edema Pulses: 2+ and symmetric Skin: Skin color, texture, turgor normal. No rashes or lesions Neurologic: Grossly normal Psych: Pleasant  EKG:   Sinus rhythm with PACs at 76-personally reviewed  ASSESSMENT: 1. CAD status post anterior NSTEMI - DES to the mid-LAD with a 2.75 mm x 18 mm DES (10/2015) 2. Ischemic cardiomyopathy - EF 40-45% by cath (at 50% by echo) 3. Hypertension 4. Dyslipidemia 5. Obesity with recent weight gain 6. OSA on BiPAP 7. History of depression with suicidality  PLAN:  Patrick Wilkins has had continued weight gain and struggled recently with depression and significant life stressors.  Fortunately he has overcome that and is off of antidepressant medication.  He has switched to BiPAP which seems to be helping with his fatigue over the CPAP.  Blood pressure is well controlled.  He denies any further anginal symptoms.  We will plan to refill his nitroglycerin today for as needed use.  He will also need a repeat lipid profile when he is fasting.  Follow-up in 1 year.  Chrystie NoseKenneth C. Hilty, MD, Reynolds Army Community HospitalFACC, FACP  [facility]  Medical Director of the [facility] Diplomate of the American Board of Clinical  Lipidology Attending Cardiologist  Direct Dial: [phone]  Fax: [phone]  Website:  [email or web address]

## 2018-08-08 ENCOUNTER — Other Ambulatory Visit: Payer: Self-pay | Admitting: Cardiology

## 2018-08-08 DIAGNOSIS — I10 Essential (primary) hypertension: Secondary | ICD-10-CM

## 2018-08-08 NOTE — Telephone Encounter (Signed)
Rx(s) sent to pharmacy electronically.  

## 2018-08-10 LAB — LIPID PANEL
CHOL/HDL RATIO: 2.5 ratio (ref 0.0–5.0)
Cholesterol, Total: 114 mg/dL (ref 100–199)
HDL: 46 mg/dL (ref >39)
LDL CALC: 57 mg/dL (ref 0–99)
Triglycerides: 57 mg/dL (ref 0–149)
VLDL CHOLESTEROL CAL: 11 mg/dL (ref 5–40)

## 2018-11-16 ENCOUNTER — Other Ambulatory Visit: Payer: Self-pay | Admitting: Internal Medicine

## 2018-11-16 ENCOUNTER — Telehealth: Payer: Self-pay | Admitting: Internal Medicine

## 2018-11-16 NOTE — Telephone Encounter (Signed)
Returned call to patient and made aware per last fill he should start allowing pcp pick-up filling bp meds. Pt verbalized understanding. Nothing further needed.

## 2019-01-07 ENCOUNTER — Other Ambulatory Visit: Payer: Self-pay

## 2019-01-07 MED ORDER — ATORVASTATIN CALCIUM 80 MG PO TABS: ORAL_TABLET | ORAL | 3 refills | Status: DC

## 2019-03-14 ENCOUNTER — Telehealth: Payer: Self-pay | Admitting: Internal Medicine

## 2019-03-14 NOTE — Telephone Encounter (Signed)
Spoke with pt and has noted since knee sx  approx 1 mo ago has SOB with very little exertion Per pt had episode yesterday where was out in yard for about 15 min and didn't think was going to get back to house about 75 steps away.Per pt has noted some swelling to ankles Pt  has not watched salt intake Pt states when had MI had sharp pain in center of chest no SOB.Pt has significant weight gain due to having knee issues and since surgery is hoping to lose weight Pt has virtual visit tom with Dr Debara Pickett will keep as scheduled Also, has not done any sitting for long periods of time. Will forward to Dr Debara Pickett for review Instructed pt if has any increase in S/S to go to ED for eval and tx./cy

## 2019-03-14 NOTE — Telephone Encounter (Signed)
°  Pt c/o Shortness Of Breath: STAT if SOB developed within the last 24 hours or pt is noticeably SOB on the phone  1. Are you currently SOB (can you hear that pt is SOB on the phone)? no  2. How long have you been experiencing SOB? Since surgery about 10 weeks ago  3. Are you SOB when sitting or when up moving around? During exercise. Pt is recovering from knee surgery. He gets SOB after about 10 minutes and this has delayed his Physical Therapy  4. Are you currently experiencing any other symptoms? Pt has gained weight. He is unable to walk and exercise which has attributed to the weight gain  Pt has a virtual visit scheduled with DR. Hilty tomorrow.

## 2019-03-14 NOTE — Telephone Encounter (Signed)
Lm to call back ./cy 

## 2019-03-14 NOTE — Telephone Encounter (Signed)
New Message ° ° °Patient returning your call. °

## 2019-03-15 ENCOUNTER — Telehealth: Payer: Self-pay | Admitting: Internal Medicine

## 2019-03-15 ENCOUNTER — Telehealth (INDEPENDENT_AMBULATORY_CARE_PROVIDER_SITE_OTHER): Payer: BC Managed Care – PPO | Admitting: Internal Medicine

## 2019-03-15 ENCOUNTER — Encounter: Payer: Self-pay | Admitting: Internal Medicine

## 2019-03-15 VITALS — BP 137/70 | HR 75 | Temp 97.1°F | Ht 71.0 in | Wt 273.2 lb

## 2019-03-15 DIAGNOSIS — I5042 Chronic combined systolic (congestive) and diastolic (congestive) heart failure: Secondary | ICD-10-CM | POA: Diagnosis not present

## 2019-03-15 DIAGNOSIS — I5189 Other ill-defined heart diseases: Secondary | ICD-10-CM

## 2019-03-15 DIAGNOSIS — I10 Essential (primary) hypertension: Secondary | ICD-10-CM

## 2019-03-15 DIAGNOSIS — I2510 Atherosclerotic heart disease of native coronary artery without angina pectoris: Secondary | ICD-10-CM

## 2019-03-15 DIAGNOSIS — R0602 Shortness of breath: Secondary | ICD-10-CM | POA: Diagnosis not present

## 2019-03-15 DIAGNOSIS — G4733 Obstructive sleep apnea (adult) (pediatric): Secondary | ICD-10-CM

## 2019-03-15 DIAGNOSIS — E669 Obesity, unspecified: Secondary | ICD-10-CM

## 2019-03-15 DIAGNOSIS — E66811 Obesity, class 1: Secondary | ICD-10-CM

## 2019-03-15 NOTE — Telephone Encounter (Signed)
Patient called to review e-visit instructions. Patient aware that the following changes have been made: none Patient aware that they will need the following labs: CBC, BNP, BMET Patient aware that they will need the following test(s): echo  Message sent to [facility] pool to arrange echo & f/up in 1 month  No further assistance needed at this time.

## 2019-03-15 NOTE — Progress Notes (Signed)
Virtual Visit via Video Note   This visit type was conducted due to national recommendations for restrictions regarding the COVID-19 Pandemic (e.g. social distancing) in an effort to limit this patient's exposure and mitigate transmission in our community.  Due to his co-morbid illnesses, this patient is at least at moderate risk for complications without adequate follow up.  This format is felt to be most appropriate for this patient at this time.  All issues noted in this document were discussed and addressed.  A limited physical exam was performed with this format.  Please refer to the patient's chart for his consent to telehealth for [facility].   Evaluation Performed:  Doxy.me video visit  Date:  03/15/2019   ID:  Patrick Wilkins, DOB Aug 20, 1955, MRN [id number]  Patient Location:  [address] [place] [id number]  Provider location:   [address], [address] [place], [place] [address]  PCP:  Patrick BlossomMorgan, Melvin K II, MD  Cardiologist:  Chrystie NoseKenneth C Patrick Schillo, MD Electrophysiologist:  None   Chief Complaint:  Shortness of breath  History of Present Illness:    Patrick Wilkins is a 63 y.o. male who presents via audio/video conferencing for a telehealth visit today.  Patrick Wilkins is seen today for video follow-up.  I saw him earlier this year and he was doing well without any anginal complaints or worsening shortness of breath.  He recently underwent right total knee replacement.  This required a lot of pain medication including Dilaudid.  He is finally weaned off the narcotics and is currently using Tylenol.  Although I suspect he is he been deconditioned, he reports that he is been having some progressive shortness of breath.  He noted the other day when he pushed mowed his lawn that he became short of breath and had to stop several times.  He denied any chest pain.  In the past he has had anginal symptoms.  It is notable that he had multivessel coronary disease of mild to moderate  stenosis in 2017 and had intervention to the LAD at the time.  He was having angina at the time.  Fortunately his angina has not recurred.  He had stress testing in July of last year which was low risk.  His last echo was in 2017 that showed low normal systolic function.  He does report some increasing lower extremity edema but denies orthopnea.  He notes he is short of breath walking up stairs or doing certain activities.  Weight is about the same as it had been in the past however he said he did lose some weight and gained it back.  The patient does not have symptoms concerning for COVID-19 infection (fever, chills, cough, or new SHORTNESS OF BREATH).    Prior CV studies:   The following studies were reviewed today:  Chart reviewed  PMHx:  Past Medical History:  Diagnosis Date   Anxiety    Arthritis    "right knee, back" (11/24/2015)   CAD (coronary artery disease)    a. NSTEMI 10/30/15 s/p PTCA and DES of total occusion of  mid LAD. ostial and proximal LAD 50% stenosis, 50-70% PDA stenosis, and 70% stenosis of the first obtuse marginal.    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"   ABDOMINAL HERNIA REPAIR  ~ 2014   CARDIAC CATHETERIZATION N/A 10/30/2015   Procedure: Left Heart Cath and Coronary Angiography;  Surgeon: Lyn RecordsHenry W Smith, MD;  [facility];  Service: Cardiovascular;  Laterality: N/A;   CARDIAC CATHETERIZATION N/A 10/30/2015   Procedure: Coronary Stent Intervention;  Surgeon: Lyn RecordsHenry W Smith, MD;  [facility];  Service: Cardiovascular;   Laterality: N/A;   CYSTOSCOPY W/ STONE MANIPULATION  ~ 2002 X 2   HERNIA REPAIR     KNEE ARTHROSCOPY Right ~ 2012   "ACL repaired"   LAPAROSCOPIC CHOLECYSTECTOMY  ~ 2015    FAMHx:  Family History  Problem Relation Age of Onset   Hypertension Maternal Grandmother    Hypertension Maternal Grandfather    Heart attack Paternal Uncle     SOCHx:   reports that he has quit smoking. His smoking use included cigarettes. He has a 5.00 pack-year smoking history. He has quit using smokeless tobacco.  His smokeless tobacco use included chew. He reports current alcohol use. He reports that he does not use drugs.  ALLERGIES:  Allergies  Allergen Reactions   Other Anaphylaxis    [place] nuts: Stop breathing    Oxycodone Rash   Tyloxapol Itching and Nausea And Vomiting    Itching and vomiting  Tolerates other forms of oxycodone    MEDS:  Current Meds  Medication Sig   acetaminophen (TYLENOL) 500 MG tablet Take 1,000 mg by mouth 2 (two) times daily.   allopurinol (ZYLOPRIM) 100 MG tablet Take 100 mg by mouth daily.   aspirin 81 MG tablet Take 81 mg by mouth daily.    atorvastatin (LIPITOR) 80 MG tablet TAKE 1 TABLET BY MOUTH DAILY AT 6PM.   azelastine (ASTELIN) 0.1 % nasal spray Place 2 sprays into both nostrils 2 (two) times daily as needed.   carvedilol (COREG) 6.25 MG tablet Take 1 tablet (6.25 mg total) by mouth 2 (two) times daily with a meal.   cetirizine (ZYRTEC) 10 MG tablet Take 10 mg by mouth daily as needed for allergies.   Coenzyme Q10 (CO Q-10 PO) Take 1 capsule by mouth at bedtime.   diclofenac sodium (VOLTAREN) 1 % GEL Apply topically as needed.   docusate sodium (COLACE) 100 MG capsule Take 1 capsule by mouth as directed.   Famotidine (PEPCID PO) Take 0.5 tablets by mouth 2 (two) times daily.    fluticasone (FLONASE) 50 MCG/ACT nasal spray Place into both nostrils as needed for allergies or rhinitis.   losartan (COZAAR) 100 MG tablet TAKE 1/2 TABLET  (50MG ) BY MOUTH EVERY DAY   MAGNESIUM PO Take by mouth at bedtime.   Multiple Vitamins-Minerals (CENTRUM SILVER PO) Take 1 tablet by mouth daily.   nitroGLYCERIN (NITROSTAT) 0.4 MG SL tablet Place 1 tablet (0.4 mg total) under the tongue every 5 (five) minutes as needed for chest pain. Given by EMS   NON FORMULARY at bedtime. BiPAP   olopatadine (PATANOL) 0.1 % ophthalmic solution Place 1 drop into both eyes as needed for allergies.   Polyethylene Glycol 3350 (MIRALAX PO) Take by mouth daily before supper.   potassium citrate (UROCIT-K) 10 MEQ (1080 MG) SR tablet Take 1 tablet (10 mEq total) by mouth 2 (two) times daily.  pregabalin (LYRICA) 25 MG capsule Take 50 mg by mouth 3 (three) times daily.   PROAIR HFA 108 (90 Base) MCG/ACT inhaler Inhale 2 puffs into the lungs every 6 (six) hours as needed.    psyllium (METAMUCIL) 58.6 % packet Take 1 packet by mouth at bedtime.   Sulfamethoxazole POWD by Does not apply route daily as needed.   tadalafil (CIALIS) 5 MG tablet Take 5 mg by mouth daily as needed for erectile dysfunction.   traMADol (ULTRAM) 50 MG tablet Take 50 mg by mouth every 8 (eight) hours as needed.    vitamin C (ASCORBIC ACID) 500 MG tablet Take 500 mg by mouth daily.     ROS: Pertinent items noted in HPI and remainder of comprehensive ROS otherwise negative.  Labs/Other Tests and Data Reviewed:    Recent Labs: No results found for requested labs within last 8760 hours.   Recent Lipid Panel Lab Results  Component Value Date/Time   CHOL 114 08/09/2018 12:56 PM   TRIG 57 08/09/2018 12:56 PM   HDL 46 08/09/2018 12:56 PM   CHOLHDL 2.5 08/09/2018 12:56 PM   CHOLHDL 4.8 11/01/2015 05:04 AM   LDLCALC 57 08/09/2018 12:56 PM    Wt Readings from Last 3 Encounters:  03/15/19 273 lb 3.2 oz (123.9 kg)  07/30/18 273 lb 6.4 oz (124 kg)  02/02/18 263 lb (119.3 kg)     Exam:    Vital Signs:  BP 137/70    Pulse 75    Temp (!) 97.1 F (36.2 C)    Ht 5\' 11"  (1.803  m)    Wt 273 lb 3.2 oz (123.9 kg)    BMI 38.10 kg/m    General appearance: alert, no distress and moderately obese Lungs: No visual respiratory difficulty Abdomen: obese Extremities: edema Trace bilateral ankle edema Skin: Skin color, texture, turgor normal. No rashes or lesions Neurologic: Mental status: Alert, oriented, thought content appropriate Psych: Anxious  ASSESSMENT & PLAN:    1. Progressive dyspnea on exertion 2. CAD status post anterior NSTEMI - DES to the mid-LAD with a 2.75 mm x 18 mm DES (10/2015) 3. Ischemic cardiomyopathy - EF 40-45% by cath (at 50% by echo) 4. Hypertension 5. Dyslipidemia 6. Obesity with recent weight gain 7. OSA on BiPAP 8. History of depression with suicidality 9. S/p right TKR  Mr. Heinlein has had progressive dyspnea on exertion after recent right total knee replacement.  Weight is about the same as it had been in the past.  He reports some lower extremity edema.  He had had reduced LV function which came up to low normal in 2017.  Nuclear stress test in July last year showed no ischemia with normal LV function.  This could be some heart failure, or perhaps pulmonary cause.  He reports noncompliance with BiPAP recently and he did use it last night.  He is also been taking some naps during the day.  I have encouraged him to go back and use the BiPAP more regularly.  We will get another echocardiogram and check labs including a BMET, BNP and CBC.  Plan follow-up with me in 1 month in the office.  COVID-19 Education: The signs and symptoms of COVID-19 were discussed with the patient and how to seek care for testing (follow up with PCP or arrange E-visit).  The importance of social distancing was discussed today.  Patient Risk:   After full review of this patients clinical status, I feel that they are at least moderate risk  at this time.  Time:   Today, I have spent 35 minutes with the patient with telehealth technology discussing dyspnea, recent  surgery, lower extremity swelling, history of coronary disease, OSA.     Medication Adjustments/Labs and Tests Ordered: Current medicines are reviewed at length with the patient today.  Concerns regarding medicines are outlined above.   Tests Ordered: Orders Placed This Encounter  Procedures   CBC   Basic metabolic panel   Brain natriuretic peptide   ECHOCARDIOGRAM COMPLETE    Medication Changes: No orders of the defined types were placed in this encounter.   Disposition:  in 1 month(s)  Pixie Casino, MD, [facility], [place] Director of the Advanced Lipid Disorders &  Cardiovascular Risk Reduction Clinic Diplomate of the American Board of Clinical Lipidology Attending Cardiologist  Direct Dial: [phone]   Fax: [phone]  Website:  [email or web address]  Pixie Casino, MD  03/15/2019 9:25 AM

## 2019-03-15 NOTE — Patient Instructions (Signed)
Medication Instructions:  Your physician recommends that you continue on your current medications as directed. Please refer to the Current Medication list given to you today.  If you need a refill on your cardiac medications before your next appointment, please call your pharmacy.   Lab work: CBC, BMET, BNP If you have labs (blood work) drawn today and your tests are completely normal, you will receive your results only by: Marland Kitchen MyChart Message (if you have MyChart) OR . A paper copy in the mail If you have any lab test that is abnormal or we need to change your treatment, we will call you to review the results.  Testing/Procedures: Your physician has requested that you have an echocardiogram. Echocardiography is a painless test that uses sound waves to create images of your heart. It provides your doctor with information about the size and shape of your heart and how well your heart's chambers and valves are working. This procedure takes approximately one hour. There are no restrictions for this procedure. -- done at [address] - [address]  Follow-Up: At [facility], you and your health needs are our priority.  As part of our continuing mission to provide you with exceptional heart care, we have created designated Provider Care Teams.  These Care Teams include your primary Cardiologist (physician) and Advanced Practice Providers (APPs -  Physician Assistants and Nurse Practitioners) who all work together to provide you with the care you need, when you need it. . You will be scheduled for a follow up in [place] with Dr. Debara Pickett or one of the APPs on his care team [facility] Bouse PA or [place])

## 2019-03-20 NOTE — Telephone Encounter (Signed)
See office note from 9/4 ./cy

## 2019-03-26 LAB — BASIC METABOLIC PANEL
BUN/Creatinine Ratio: 8 — ABNORMAL LOW (ref 10–24)
BUN: 9 mg/dL (ref 8–27)
CO2: 25 mmol/L (ref 20–29)
Calcium: 9.1 mg/dL (ref 8.6–10.2)
Chloride: 102 mmol/L (ref 96–106)
Creatinine, Ser: 1.07 mg/dL (ref 0.76–1.27)
GFR calc Af Amer: 86 mL/min/{1.73_m2} (ref >59)
GFR calc non Af Amer: 74 mL/min/{1.73_m2} (ref >59)
Glucose: 107 mg/dL — ABNORMAL HIGH (ref 65–99)
Potassium: 4.4 mmol/L (ref 3.5–5.2)
Sodium: 144 mmol/L (ref 134–144)

## 2019-03-26 LAB — CBC
Hematocrit: 38.2 % (ref 37.5–51.0)
Hemoglobin: 12.4 g/dL — ABNORMAL LOW (ref 13.0–17.7)
MCH: 27 pg (ref 26.6–33.0)
MCHC: 32.5 g/dL (ref 31.5–35.7)
MCV: 83 fL (ref 79–97)
Platelets: 289 10*3/uL (ref 150–450)
RBC: 4.6 x10E6/uL (ref 4.14–5.80)
RDW: 14.2 % (ref 11.6–15.4)
WBC: 8.3 10*3/uL (ref 3.4–10.8)

## 2019-03-26 LAB — BRAIN NATRIURETIC PEPTIDE: BNP: 34.6 pg/mL (ref 0.0–100.0)

## 2019-03-28 ENCOUNTER — Other Ambulatory Visit: Payer: Self-pay

## 2019-03-28 ENCOUNTER — Ambulatory Visit (HOSPITAL_COMMUNITY): Payer: BC Managed Care – PPO | Attending: Cardiovascular Disease

## 2019-03-28 DIAGNOSIS — R0602 Shortness of breath: Secondary | ICD-10-CM

## 2019-04-29 ENCOUNTER — Ambulatory Visit: Payer: BC Managed Care – PPO | Admitting: Internal Medicine

## 2019-07-22 ENCOUNTER — Other Ambulatory Visit: Payer: Self-pay | Admitting: Internal Medicine

## 2019-07-22 DIAGNOSIS — I10 Essential (primary) hypertension: Secondary | ICD-10-CM

## 2019-09-30 ENCOUNTER — Ambulatory Visit: Payer: BC Managed Care – PPO | Admitting: Internal Medicine

## 2019-10-03 ENCOUNTER — Other Ambulatory Visit: Payer: Self-pay

## 2019-10-03 ENCOUNTER — Ambulatory Visit (INDEPENDENT_AMBULATORY_CARE_PROVIDER_SITE_OTHER): Payer: 59 | Admitting: Internal Medicine

## 2019-10-03 ENCOUNTER — Encounter: Payer: Self-pay | Admitting: Internal Medicine

## 2019-10-03 VITALS — BP 118/66 | HR 70 | Temp 97.2°F | Ht 71.0 in | Wt 272.4 lb

## 2019-10-03 DIAGNOSIS — G4733 Obstructive sleep apnea (adult) (pediatric): Secondary | ICD-10-CM

## 2019-10-03 DIAGNOSIS — R0602 Shortness of breath: Secondary | ICD-10-CM

## 2019-10-03 DIAGNOSIS — I2510 Atherosclerotic heart disease of native coronary artery without angina pectoris: Secondary | ICD-10-CM

## 2019-10-03 DIAGNOSIS — R600 Localized edema: Secondary | ICD-10-CM

## 2019-10-03 MED ORDER — FUROSEMIDE 20 MG PO TABS: 20.0000 mg | ORAL_TABLET | Freq: Every day | ORAL | 3 refills | Status: DC | PRN

## 2019-10-03 NOTE — Patient Instructions (Signed)
Medication Instructions:  Dr. Debara Pickett has prescribed lasix to use daily as needed for swelling *If you need a refill on your cardiac medications before your next appointment, please call your pharmacy*   Follow-Up: At [facility], you and your health needs are our priority.  As part of our continuing mission to provide you with exceptional heart care, we have created designated Provider Care Teams.  These Care Teams include your primary Cardiologist (physician) and Advanced Practice Providers (APPs -  Physician Assistants and Nurse Practitioners) who all work together to provide you with the care you need, when you need it.  We recommend signing up for the patient portal called "MyChart".  Sign up information is provided on this After Visit Summary.  MyChart is used to connect with patients for Virtual Visits (Telemedicine).  Patients are able to view lab/test results, encounter notes, upcoming appointments, etc.  Non-urgent messages can be sent to your provider as well.   To learn more about what you can do with MyChart, go to [email or web address].    Your next appointment:   6 month(s)  The format for your next appointment:   In Person  Provider:   You may see Pixie Casino, MD or one of the following Advanced Practice Providers on your designated Care Team:    Almyra Deforest, PA-C  Fabian Sharp, PA-C or   Roby Lofts, [place]    Other Instructions  Dr. Debara Pickett recommends KNEE HIGH COMPRESSION STOCKINGS  -- 20-30 mmHg (compression strength) -- [facility]  -- [address]  -- [place]. [place]   -- [place]  -- [address] [place]  -- [phone]   How to Use Compression Stockings Compression stockings are elastic socks that squeeze the legs. They help to increase blood flow to the legs, decrease swelling in the legs, and reduce the  chance of developing blood clots in the lower legs. Compression stockings are often used by people who:  Are recovering from surgery.  Have poor circulation in their legs.  Are prone to getting blood clots in their legs.  Have varicose veins.  Sit or stay in bed for long periods of time. How to use compression stockings Before you put on your compression stockings:  Make sure that they are the correct size. If you do not know your size, ask your health care provider.  Make sure that they are clean, dry, and in good condition.  Check them for rips and tears. Do not put them on if they are ripped or torn. Put your stockings on first thing in the morning, before you get out of bed. Keep them on for as long as your health care provider advises. When you are wearing your stockings:  Keep them as smooth as possible. Do not allow them to bunch up. It is especially important to prevent the stockings from bunching up around your toes or behind your knees.  Do not roll the stockings downward and leave them rolled down. This can decrease blood flow to your leg.  Change them right away if they become wet or dirty. When you take off your stockings, inspect your legs and feet. Anything that does not seem normal may require medical attention. Look for:  Open sores.  Red spots.  Swelling. Information and tips  Do not stop wearing your compression stockings without talking to your health care provider first.  [place]  your stockings every day with mild detergent in cold or warm water. Do not use bleach. Air-dry your stockings or dry them in a clothes dryer on low heat.  Replace your stockings every 3-6 months.  If skin moisturizing is part of your treatment plan, apply lotion or cream at night so that your skin will be dry when you put on the stockings in the morning. It is harder to put the stockings on when you have lotion on your legs or feet. Contact a health care provider if: Remove your  stockings and seek medical care if:  You have a feeling of pins and needles in your feet or legs.  You have any new changes in your skin.  You have skin lesions that are getting worse.  You have swelling or pain that is getting worse. Get help right away if:  You have numbness or tingling in your lower legs that does not get better right after you take the stockings off.  Your toes or feet become cold and blue.  You develop open sores or red spots on your legs that do not go away.  You see or feel a warm spot on your leg.  You have new swelling or soreness in your leg.  You are short of breath or you have chest pain for no reason.  You have a rapid or irregular heartbeat.  You feel light-headed or dizzy. This information is not intended to replace advice given to you by your health care provider. Make sure you discuss any questions you have with your health care provider. Document Released: 04/24/2009 Document Revised: 11/25/2015 Document Reviewed: 06/04/2014 Elsevier Interactive Patient Education  2017 [employer].

## 2019-10-03 NOTE — Progress Notes (Signed)
Date:  10/03/2019   ID:  RAGNAR WAAS, DOB [birth year], MRN [id number]  PCP:  [name], MD  Primary Cardiologist:  Rennis Golden  CC: Shortness of breath, weight gain   History of Present Illness: Patrick Wilkins is a 64 y.o. male history of tobacco abuse but no known cardiac history presented 10/30/15 with chest pain. Reports starting around 6pm onset of 10/10 pressure in midchest with diaphoresis and SOB. Symptoms improved but not resolved with SL NG x3, also received ASA in the field and 6mg  of IV morphine. Pain down from 10/10 to 5/10 but not resolved. Brought in as code STEMI by EMS. Initial troponin of 0.11.   Cath with multivessel CAD. S/p PTCA and DES of total occlusion of mid LAD. Continued ASA/ brilinta/ statin/ lisinopril and coreg. Continue to have mild chest discomfort. Due to elevated BP, lisinopril dose was adjusted. The patient was given IV lasix for mild right basilar crackles and EF of 40-45% on cath. Echo showed improved EF to 50-55%, grade 2 DD, trace MR and TR. Dyspnea has resolved with lasix, felt he will not need lasix at home. Changed protonix back to omeprazole at discharge. The patient takes Celebrex for DDD, advised to avoid celebrex/ NSAIDs if able.  Patient presents for posthospital follow-up. He reports doing well. He is walking 15 minutes twice a day without any problems. He is taking Tylenol instead of Celebrex for his back. He has requested tramadol which helped in the hospital. He is taking all medications as prescribed. Did state that he was having some quivering in his heart/nervousness 1 day or so he took his clonazepam and that resolved it.   The patient currently denies nausea, vomiting, fever, chest pain, shortness of breath, orthopnea, dizziness, PND, cough, congestion, abdominal pain, hematochezia, melena, lower extremity edema, claudication.  12/08/2015  Patrick Wilkins returns today for follow-up. He was seen after his recent acute MI by 2 physician  assistants. The second office visit was for acute chest pain. There was concern as he had some moderate lesions on That were not revascularized that he may be having additional ischemia. He was admitted and ruled out for MI. He underwent nuclear stress testing which was negative for ischemia. He was then discharged. He says he has had no further chest pain symptoms. He is on low-dose imdur 15 mg daily. He's been compliant with aspirin and Brilinta and is exercising on his own. He says disease consider cardiac rehabilitation however it would cost him over $100 per session which is quite expensive. He may likely be able to do this exercise on his own. Today's asking if he could use Bactrim as needed for what sounds like recurrent abscesses. There is no issue that I have with it. He does get some dizziness with quick position changes. He denies any further chest pain.  01/06/2016  Patrick Wilkins returns today for follow-up. I received several messages from cardiac rehabilitation regarding ongoing chest pain symptoms. These have occurred with exercise, particularly cardiac rehabilitation and with sexual activity. He has stopped taking isosorbide due to headaches and intolerance to that medication and noted that it was not particularly helpful with chest pain. He is under a lot of stress at work as a 01/08/2016 and seems to be emotionally challenged. In fact he was tearful in the office today and says that he is considered switching careers although does not know what he would do at this late life.  01/27/2016  Patrick Wilkins was  seen today in follow-up. He reports his chest pain is resolved. Unfortunately he's having problems with constipation and has recently had some mild rectal blood after bowel movements. He was determined to have hemorrhoids by his PCP and placed on Proctocort. The bleeding may be more significant due to his antiplatelet therapy. He continues to have hard stools and would benefit from a stool softener  and occasional use of relaxer constipation. He does take Metamucil.  04/14/2016  Patrick Wilkins returns today for follow-up. He is feeling much better.He is managed to lose a significant amount of was down to 200  At one point he was up to 245 pounds. He's exercising regularly and denies any chest pain. He is occasionally having dizziness. I have reviewed his home blood pressure list which was extensive and indicated that he at times has blood pressures around the 100 systolic or even in the 90s. Given his weight loss I feel that he is currently being over treated with medication. He is interested in a repeat echocardiogram to see if his LV function has normalized after anterior MI. EF was low at 50-55%. He had recent lab work from his primary care provider which showed normal renal function and a cholesterol of 91, triglycerides 96, HDL 39 and LDL 33. This represents excellent cholesterol control. He's also concerned about the cost of ranolazine, a medicine are not sure that he needs to stay on long-term. He reports his constipation and hemorrhoidal bleeding has resolved.  10/17/2016  Patrick Wilkins was seen today in follow-up. He says he still gets some occasional feeling of bruising in the chest which comes and goes but is not the cervix associated with exertion or relieved by rest. I don't believe this is angina. He was intolerant of nitrates and Ranexa but is done well with adjustments including carvedilol and supplementation with L-arginine and coQ10. He is on aspirin and Brilinta and is coming up on one year since his prior stent on 10/30/2015. Recently he's gained a little bit of weight to do less activity although blood pressure is still a goal. His cholesterol is well treated on atorvastatin with LDL-C of 33.  02/27/2017  Patrick Wilkins returns today for follow-up. He continues to be under significant amount of stress. 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 Unfortunately he's gained  another 18 pounds since her last saw him. He's been particularly immobile and is having problems with right knee pain. 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 He potentially will need partial knee replacement. He denies any cardiac chest pain or worsening shortness of breath. Recent lipid profile from his PCP and June 2018 showed total cluster 106, triglycerides 80, HDL 39 and LDL 51.  10/16/2017  Patrick Wilkins returns today for follow-up.  He is concerned about some recent weight gain.  Overall though his weight is only up about 3 pounds since December.  Blood pressure is well controlled at 126/79.  Weight when I saw him last was 233, however.  He attributes this to less physical activity.  He is recently been having some orthopedic issues.  EKG today shows sinus rhythm at 74.  He denies any chest pain or worsening shortness of breath.  07/30/2017  Patrick Wilkins is seen today in follow-up.  Unfortunately continues to gain weight.  He is now up about another 10 pounds since the summer.  Unfortunately he was struggling with depression and  a number of issues at his church and apparently was suicidal at one point.  He was on antidepressant medications but recently came off of them and things seem to have improved.  This probably was a related cause of his weight gain.  His lipid profile was well controlled 9 months ago however I suspect it is worse now given the weight gain.  He denies any chest pain though or worsening shortness of breath.  Physical activity is decreased.  He is now on BiPAP instead of CPAP therapy and does feel like he has had some improvement in his energy level.  For time being he was reportedly using energy drinks but I advised against that.  10/03/2019  Patrick Wilkins is seen today in follow-up.  He describes worsening shortness of breath and weight gain which is significant.  Weight had gone up over 280 pounds however he  is back down to 272 in the office today.  His shortness of breath was significant and he could barely walk across the room.  He is also suffering with plantar fasciitis.  He has had some injections for that.  He is managed to lose about 20 pounds recently and his shortness of breath has improved.  He was concerned about heart failure however his last echo in September 2020 showed EF of 60 to 65% which is a significant improvement in heart function.  His lipids in January showed total cholesterol 114, triglycerides 57, HDL 46 and LDL 57.  Blood pressures well controlled today at 118/66 and his EKG is benign showing sinus bradycardia 58 without any ischemia.  Wt Readings from Last 3 Encounters:  10/03/19 272 lb 6.4 oz (123.6 kg)  03/15/19 273 lb 3.2 oz (123.9 kg)  07/30/18 273 lb 6.4 oz (124 kg)     Past Medical History:  Diagnosis Date  . Anxiety   . Arthritis    "right knee, back" (11/24/2015)  . CAD (coronary artery disease)    a. NSTEMI 10/30/15 s/p PTCA and DES of total occusion of  mid LAD. ostial and proximal LAD 50% stenosis, 50-70% PDA stenosis, and 70% stenosis of the first obtuse marginal.   . CHF (congestive heart failure) (HCC) 10/2015  . Childhood asthma   . Complication of anesthesia 1958   "died on the table when they did OR to dx TB"  . GERD (gastroesophageal reflux disease)   . Hyperlipidemia   . Hypertension   . Kidney stones   . NSTEMI (non-ST elevated myocardial infarction) (HCC) 10/2015  . Tuberculosis 1958    Current Outpatient Medications  Medication Sig Dispense Refill  . acetaminophen (TYLENOL) 500 MG tablet Take 1,000 mg by mouth 2 (two) times daily.    Marland Kitchen. allopurinol (ZYLOPRIM) 100 MG tablet Take 100 mg by mouth daily.    Marland Kitchen. aspirin 81 MG tablet Take 81 mg by mouth daily.     Marland Kitchen. atorvastatin (LIPITOR) 80 MG tablet TAKE 1 TABLET BY MOUTH DAILY AT 6PM. 90 tablet 3  . azelastine (ASTELIN) 0.1 % nasal spray Place 2 sprays into both nostrils 2 (two) times daily as  needed.  2  . carvedilol (COREG) 6.25 MG tablet TAKE 1 TABLET (6.25 MG TOTAL) BY MOUTH 2 (TWO) TIMES DAILY WITH A MEAL. 180 tablet 3  . Coenzyme Q10 (CO Q-10 PO) Take 1 capsule by mouth at bedtime.    . diclofenac sodium (VOLTAREN) 1 % GEL Apply topically as needed.    . docusate sodium (COLACE) 100 MG capsule Take 1  capsule by mouth as directed.    . Famotidine (PEPCID PO) Take 0.5 tablets by mouth 2 (two) times daily.     . fluticasone (FLONASE) 50 MCG/ACT nasal spray Place into both nostrils as needed for allergies or rhinitis.    Marland Kitchen losartan (COZAAR) 100 MG tablet TAKE 1/2 TABLET (50MG ) BY MOUTH EVERY DAY 45 tablet 0  . MAGNESIUM PO Take by mouth at bedtime.    . Multiple Vitamins-Minerals (CENTRUM SILVER PO) Take 1 tablet by mouth daily.    . nitroGLYCERIN (NITROSTAT) 0.4 MG SL tablet Place 1 tablet (0.4 mg total) under the tongue every 5 (five) minutes as needed for chest pain. Given by EMS 25 tablet 3  . NON FORMULARY at bedtime. BiPAP    . olopatadine (PATANOL) 0.1 % ophthalmic solution Place 1 drop into both eyes as needed for allergies.    . Polyethylene Glycol 3350 (MIRALAX PO) Take by mouth daily before supper.    . potassium citrate (UROCIT-K) 10 MEQ (1080 MG) SR tablet Take 1 tablet (10 mEq total) by mouth 2 (two) times daily.    PROAIR HFA 108 (90 Base) MCG/ACT inhaler Inhale 2 puffs into the lungs every 6 (six) hours as needed.     . psyllium (METAMUCIL) 58.6 % packet Take 1 packet by mouth at bedtime.    . Sulfamethoxazole POWD by Does not apply route daily as needed.    . tadalafil (CIALIS) 5 MG tablet Take 5 mg by mouth daily as needed for erectile dysfunction.    . traMADol (ULTRAM) 50 MG tablet Take 50 mg by mouth every 8 (eight) hours as needed.   0  . vitamin C (ASCORBIC ACID) 500 MG tablet Take 500 mg by mouth daily.    . cetirizine (ZYRTEC) 10 MG tablet Take 10 mg by mouth daily as needed for allergies.    . pregabalin (LYRICA) 25 MG capsule Take 50 mg by mouth 3  (three) times daily.     No current facility-administered medications for this visit.    Allergies:    Allergies  Allergen Reactions  . Other Anaphylaxis    Marland Kitchen nuts: Stop breathing   . Oxycodone Rash  . Tyloxapol Itching and Nausea And Vomiting    Itching and vomiting  Tolerates other forms of oxycodone    Social History:  The patient  reports that he has quit smoking. His smoking use included cigarettes. He has a 5.00 pack-year smoking history. He has quit using smokeless tobacco.  His smokeless tobacco use included chew. He reports current alcohol use. He reports that he does not use drugs.   Family history:   Family History  Problem Relation Age of Onset  . Hypertension Maternal Grandmother   . Hypertension Maternal Grandfather   . Heart attack Paternal Uncle     ROS:  Pertinent items noted in HPI and remainder of comprehensive ROS otherwise negative.  PHYSICAL EXAM: VS:  BP 118/66   Pulse 70   Temp (!) 97.2 F (36.2 C)   Ht 5\' 11"  (1.803 m)   Wt 272 lb 6.4 oz (123.6 kg)   SpO2 95%   BMI 37.99 kg/m  General appearance: alert, no distress and mildly obese Neck: no carotid bruit, no JVD and thyroid not enlarged, symmetric, no tenderness/mass/nodules Lungs: clear to auscultation bilaterally Heart: regular rate and rhythm, S1, S2 normal, no murmur, click, rub or gallop Abdomen: soft, non-tender; bowel sounds normal; no masses,  no organomegaly Extremities: extremities normal, atraumatic, no cyanosis  or edema Pulses: 2+ and symmetric Skin: Skin color, texture, turgor normal. No rashes or lesions Neurologic: Grossly normal Psych: Pleasant  EKG:   Sinus bradycardia 58-personally reviewed  ASSESSMENT: 1. CAD status post anterior NSTEMI - DES to the mid-LAD with a 2.75 mm x 18 mm DES (10/2015) 2. Ischemic cardiomyopathy - EF 40-45% by cath (at 50% by echo) -> improved to 60-65% (03/2019) 3. Hypertension 4. Dyslipidemia 5. Morbid obesity with recent weight  gain 6. OSA on BiPAP 7. History of depression with suicidality 8. Leg edema from venous hypertension  PLAN:  Patrick Wilkins has recently had significant weight gain and shortness of breath.  However his echo has shown normalization of LV function and his EKG is is normal.  Blood pressure is well controlled and cholesterol is at goal.  I think the weight gain is the primary driver of his shortness of breath and he has noted some improvement with recent 20 pound weight loss.  He suffered with some plantar fasciitis which has been injected and he hopes now that he is more mobile he will be able to get more physical activity.  He is struggling with some lower extremity edema, which I think is venous hypertension.  Weight loss will help this along with elevation, compression stockings and I will provide a low-dose as needed Lasix.  Follow-up with me in 6 months or sooner as necessary.  Pixie Casino, MD, [facility], [place] Director of the Advanced Lipid Disorders &  Cardiovascular Risk Reduction Clinic Diplomate of the American Board of Clinical Lipidology Attending Cardiologist  Direct Dial: [phone]  Fax: [phone]  Website:  [email or web address]

## 2019-12-24 ENCOUNTER — Other Ambulatory Visit: Payer: Self-pay | Admitting: Internal Medicine

## 2019-12-26 ENCOUNTER — Other Ambulatory Visit: Payer: Self-pay | Admitting: Internal Medicine

## 2020-02-03 ENCOUNTER — Telehealth: Payer: Self-pay | Admitting: Internal Medicine

## 2020-02-03 NOTE — Telephone Encounter (Signed)
Returned call to patient he stated he thinks he was stung by a hornet on right ear last Monday 7/19.Stated right ear did not swell.Stated he had a severe headache for almost 1 week. B/P low all week 80/40,85/50.Today he is starting to feel better B/P 120/50. He has a slight headache.He wanted Dr.Hilty to know.Advised I will send message to Dr.Hilty.

## 2020-02-03 NOTE — Telephone Encounter (Signed)
New Message:   Pt said he was stung by a Hornet last Monday. Every since lthat happen, he says blood pressure been all ver the place.   Pt c/o BP issue: STAT if pt c/o blurred vision, one-sided weakness or slurred speech  1. What are your last 5 BP readings? 80/40 90/50 today is 120/50   2. Are you having any other symptoms (ex. Dizziness, headache, blurred vision, passed out)?terrible headaches- never had a headache like this  3. What is your BP issue? Blood pressure  Is running very low

## 2020-02-05 NOTE — Telephone Encounter (Signed)
Thanks for letting me know - anything related to an insect sting would be acute unless a secondary infection developed, but appears BP is improving which is good. He should continue to monitor BP and could hold some of his BP meds if BP lower than 90 systolic.  Dr Rexene Edison

## 2020-02-06 NOTE — Telephone Encounter (Signed)
Returned call to patient left Dr.Hilty's advice on personal voice mail.

## 2020-04-06 ENCOUNTER — Ambulatory Visit: Payer: No Typology Code available for payment source | Admitting: Internal Medicine

## 2020-04-06 ENCOUNTER — Other Ambulatory Visit: Payer: Self-pay

## 2020-04-06 ENCOUNTER — Encounter: Payer: Self-pay | Admitting: Internal Medicine

## 2020-04-06 VITALS — BP 126/78 | HR 58 | Ht 71.0 in | Wt 273.0 lb

## 2020-04-06 DIAGNOSIS — E669 Obesity, unspecified: Secondary | ICD-10-CM

## 2020-04-06 DIAGNOSIS — Z6838 Body mass index (BMI) 38.0-38.9, adult: Secondary | ICD-10-CM | POA: Diagnosis not present

## 2020-04-06 DIAGNOSIS — I2510 Atherosclerotic heart disease of native coronary artery without angina pectoris: Secondary | ICD-10-CM | POA: Diagnosis not present

## 2020-04-06 NOTE — Patient Instructions (Signed)
Medication Instructions:  Your physician recommends that you continue on your current medications as directed. Please refer to the Current Medication list given to you today.  *If you need a refill on your cardiac medications before your next appointment, please call your pharmacy*  Follow-Up: At [facility], you and your health needs are our priority.  As part of our continuing mission to provide you with exceptional heart care, we have created designated Provider Care Teams.  These Care Teams include your primary Cardiologist (physician) and Advanced Practice Providers (APPs -  Physician Assistants and Nurse Practitioners) who all work together to provide you with the care you need, when you need it.  We recommend signing up for the patient portal called "MyChart".  Sign up information is provided on this After Visit Summary.  MyChart is used to connect with patients for Virtual Visits (Telemedicine).  Patients are able to view lab/test results, encounter notes, upcoming appointments, etc.  Non-urgent messages can be sent to your provider as well.   To learn more about what you can do with MyChart, go to [email or web address].    Your next appointment:   6 month(s)  The format for your next appointment:   In Person  Provider:   You may see Chrystie Nose, MD or one of the following Advanced Practice Providers on your designated Care Team:    Azalee Course, PA-C  Micah Flesher, PA-C or   Judy Pimple, [place]    Other Instructions You have been referred to Dr. Quillian Quince  [facility] [address] [place]. [place], [place] [address] [phone]

## 2020-04-06 NOTE — Progress Notes (Signed)
Date:  04/06/2020   ID:  YOREL REDDER, DOB 08-Feb-1956, MRN [id number]  PCP:  [name], MD  Primary Cardiologist:  Rennis Golden  CC: Shortness of breath, weight gain   History of Present Illness: KYREESE CHIO is a 64 y.o. male history of tobacco abuse but no known cardiac history presented 10/30/15 with chest pain. Reports starting around 6pm onset of 10/10 pressure in midchest with diaphoresis and SOB. Symptoms improved but not resolved with SL NG x3, also received ASA in the field and 6mg  of IV morphine. Pain down from 10/10 to 5/10 but not resolved. Brought in as code STEMI by EMS. Initial troponin of 0.11.   Cath with multivessel CAD. S/p PTCA and DES of total occlusion of mid LAD. Continued ASA/ brilinta/ statin/ lisinopril and coreg. Continue to have mild chest discomfort. Due to elevated BP, lisinopril dose was adjusted. The patient was given IV lasix for mild right basilar crackles and EF of 40-45% on cath. Echo showed improved EF to 50-55%, grade 2 DD, trace MR and TR. Dyspnea has resolved with lasix, felt he will not need lasix at home. Changed protonix back to omeprazole at discharge. The patient takes Celebrex for DDD, advised to avoid celebrex/ NSAIDs if able.  Patient presents for posthospital follow-up. He reports doing well. He is walking 15 minutes twice a day without any problems. He is taking Tylenol instead of Celebrex for his back. He has requested tramadol which helped in the hospital. He is taking all medications as prescribed. Did state that he was having some quivering in his heart/nervousness 1 day or so he took his clonazepam and that resolved it.   The patient currently denies nausea, vomiting, fever, chest pain, shortness of breath, orthopnea, dizziness, PND, cough, congestion, abdominal pain, hematochezia, melena, lower extremity edema, claudication.  12/08/2015  Mr. Kaupp returns today for follow-up. He was seen after his recent acute MI by 2 physician  assistants. The second office visit was for acute chest pain. There was concern as he had some moderate lesions on That were not revascularized that he may be having additional ischemia. He was admitted and ruled out for MI. He underwent nuclear stress testing which was negative for ischemia. He was then discharged. He says he has had no further chest pain symptoms. He is on low-dose imdur 15 mg daily. He's been compliant with aspirin and Brilinta and is exercising on his own. He says disease consider cardiac rehabilitation however it would cost him over $100 per session which is quite expensive. He may likely be able to do this exercise on his own. Today's asking if he could use Bactrim as needed for what sounds like recurrent abscesses. There is no issue that I have with it. He does get some dizziness with quick position changes. He denies any further chest pain.  01/06/2016  [name] returns today for follow-up. I received several messages from cardiac rehabilitation regarding ongoing chest pain symptoms. These have occurred with exercise, particularly cardiac rehabilitation and with sexual activity. He has stopped taking isosorbide due to headaches and intolerance to that medication and noted that it was not particularly helpful with chest pain. He is under a lot of stress at work as a 01/08/2016 and seems to be emotionally challenged. In fact he was tearful in the office today and says that he is considered switching careers although does not know what he would do at this late life.  01/27/2016  [name] was  seen today in follow-up. He reports his chest pain is resolved. Unfortunately he's having problems with constipation and has recently had some mild rectal blood after bowel movements. He was determined to have hemorrhoids by his PCP and placed on Proctocort. The bleeding may be more significant due to his antiplatelet therapy. He continues to have hard stools and would benefit from a stool softener  and occasional use of relaxer constipation. He does take Metamucil.  04/14/2016  [name] returns today for follow-up. He is feeling much better.He is managed to lose a significant amount of was down to 200  At one point he was up to 245 pounds. He's exercising regularly and denies any chest pain. He is occasionally having dizziness. I have reviewed his home blood pressure list which was extensive and indicated that he at times has blood pressures around the 100 systolic or even in the 90s. Given his weight loss I feel that he is currently being over treated with medication. He is interested in a repeat echocardiogram to see if his LV function has normalized after anterior MI. EF was low at 50-55%. He had recent lab work from his primary care provider which showed normal renal function and a cholesterol of 91, triglycerides 96, HDL 39 and LDL 33. This represents excellent cholesterol control. He's also concerned about the cost of ranolazine, a medicine are not sure that he needs to stay on long-term. He reports his constipation and hemorrhoidal bleeding has resolved.  10/17/2016  [name] was seen today in follow-up. He says he still gets some occasional feeling of bruising in the chest which comes and goes but is not the cervix associated with exertion or relieved by rest. I don't believe this is angina. He was intolerant of nitrates and Ranexa but is done well with adjustments including carvedilol and supplementation with L-arginine and coQ10. He is on aspirin and Brilinta and is coming up on one year since his prior stent on 10/30/2015. Recently he's gained a little bit of weight to do less activity although blood pressure is still a goal. His cholesterol is well treated on atorvastatin with LDL-C of 33.  02/27/2017  [name] returns today for follow-up. He continues to be under significant amount of stress. 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 Unfortunately he's gained  another 18 pounds since her last saw him. He's been particularly immobile and is having problems with right knee pain. 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 He potentially will need partial knee replacement. He denies any cardiac chest pain or worsening shortness of breath. Recent lipid profile from his PCP and June 2018 showed total cluster 106, triglycerides 80, HDL 39 and LDL 51.  10/16/2017  [name] returns today for follow-up.  He is concerned about some recent weight gain.  Overall though his weight is only up about 3 pounds since December.  Blood pressure is well controlled at 126/79.  Weight when I saw him last was 233, however.  He attributes this to less physical activity.  He is recently been having some orthopedic issues.  EKG today shows sinus rhythm at 74.  He denies any chest pain or worsening shortness of breath.  07/30/2017  Mr. Timmons is seen today in follow-up.  Unfortunately continues to gain weight.  He is now up about another 10 pounds since the summer.  Unfortunately he was struggling with depression and  a number of issues at his church and apparently was suicidal at one point.  He was on antidepressant medications but recently came off of them and things seem to have improved.  This probably was a related cause of his weight gain.  His lipid profile was well controlled 9 months ago however I suspect it is worse now given the weight gain.  He denies any chest pain though or worsening shortness of breath.  Physical activity is decreased.  He is now on BiPAP instead of CPAP therapy and does feel like he has had some improvement in his energy level.  For time being he was reportedly using energy drinks but I advised against that.  10/03/2019  [name] is seen today in follow-up.  He describes worsening shortness of breath and weight gain which is significant.  Weight had gone up over 280 pounds however he  is back down to 272 in the office today.  His shortness of breath was significant and he could barely walk across the room.  He is also suffering with plantar fasciitis.  He has had some injections for that.  He is managed to lose about 20 pounds recently and his shortness of breath has improved.  He was concerned about heart failure however his last echo in September 2020 showed EF of 60 to 65% which is a significant improvement in heart function.  His lipids in January showed total cholesterol 114, triglycerides 57, HDL 46 and LDL 57.  Blood pressures well controlled today at 118/66 and his EKG is benign showing sinus bradycardia 58 without any ischemia.  04/06/2020  [name] returns today for follow-up.  He has no more chest pain at this point.  LV function had improved to normal last September.  He had been working on weight loss and was down about 20 pounds at 1 point down to 250 pounds but has rebounded back to 273.  He did bring in an extensive documentation of this.  He is try to make dietary changes but is not been able to exercise primarily due to issues with ongoing plantar fasciitis.  He has had some injections and is now using a different type of shoe which she says is helpful.  Wt Readings from Last 3 Encounters:  04/06/20 273 lb (123.8 kg)  10/03/19 272 lb 6.4 oz (123.6 kg)  03/15/19 273 lb 3.2 oz (123.9 kg)     Past Medical History:  Diagnosis Date  . Anxiety   . Arthritis    "right knee, back" (11/24/2015)  . CAD (coronary artery disease)    a. NSTEMI 10/30/15 s/p PTCA and DES of total occusion of  mid LAD. ostial and proximal LAD 50% stenosis, 50-70% PDA stenosis, and 70% stenosis of the first obtuse marginal.   . CHF (congestive heart failure) (HCC) 10/2015  . Childhood asthma   . Complication of anesthesia 1958   "died on the table when they did OR to dx TB"  . GERD (gastroesophageal reflux disease)   . Hyperlipidemia   . Hypertension   . Kidney stones   . NSTEMI (non-ST  elevated myocardial infarction) (HCC) 10/2015  . Tuberculosis 1958    Current Outpatient Medications  Medication Sig Dispense Refill  . acetaminophen (TYLENOL) 500 MG tablet Take 1,000 mg by mouth 2 (two) times daily.    Marland Kitchen allopurinol (ZYLOPRIM) 100 MG tablet Take 100 mg by mouth daily.    Marland Kitchen aspirin 81 MG tablet Take 81 mg by mouth daily.     Marland Kitchen  atorvastatin (LIPITOR) 80 MG tablet TAKE 1 TABLET BY MOUTH DAILY AT 6PM. 90 tablet 3  . azelastine (ASTELIN) 0.1 % nasal spray Place 2 sprays into both nostrils 2 (two) times daily as needed.  2  . carvedilol (COREG) 6.25 MG tablet TAKE 1 TABLET (6.25 MG TOTAL) BY MOUTH 2 (TWO) TIMES DAILY WITH A MEAL. 180 tablet 3  . cetirizine (ZYRTEC) 10 MG tablet Take 10 mg by mouth daily as needed for allergies.    . Coenzyme Q10 (CO Q-10 PO) Take 1 capsule by mouth at bedtime.    . diclofenac sodium (VOLTAREN) 1 % GEL Apply topically as needed.    . docusate sodium (COLACE) 100 MG capsule Take 1 capsule by mouth as directed.    . Famotidine (PEPCID PO) Take 0.5 tablets by mouth 2 (two) times daily.     . fluticasone (FLONASE) 50 MCG/ACT nasal spray Place into both nostrils as needed for allergies or rhinitis.    Marland Kitchen. losartan (COZAAR) 100 MG tablet TAKE 1/2 TABLET (50MG ) BY MOUTH EVERY DAY 45 tablet 0  . MAGNESIUM PO Take by mouth at bedtime.    . Multiple Vitamins-Minerals (CENTRUM SILVER 50+MEN PO) Take 1 tablet by mouth daily.    . nitroGLYCERIN (NITROSTAT) 0.4 MG SL tablet Place 1 tablet (0.4 mg total) under the tongue every 5 (five) minutes as needed for chest pain. Given by EMS 25 tablet 3  . NON FORMULARY at bedtime. BiPAP    . olopatadine (PATANOL) 0.1 % ophthalmic solution Place 1 drop into both eyes as needed for allergies.    . Polyethylene Glycol 3350 (MIRALAX PO) Take by mouth daily before supper.    . potassium citrate (UROCIT-K) 10 MEQ (1080 MG) SR tablet Take 1 tablet (10 mEq total) by mouth 2 (two) times daily.    . pregabalin (LYRICA) 25 MG  capsule Take 25 mg by mouth 2 (two) times daily.     Marland Kitchen. PROAIR HFA 108 (90 Base) MCG/ACT inhaler Inhale 2 puffs into the lungs every 6 (six) hours as needed.     . psyllium (METAMUCIL) 58.6 % packet Take 1 packet by mouth at bedtime.    . tadalafil (CIALIS) 5 MG tablet Take 5 mg by mouth daily as needed for erectile dysfunction.    . traMADol (ULTRAM) 50 MG tablet Take 50 mg by mouth every 8 (eight) hours as needed.   0  . vitamin C (ASCORBIC ACID) 500 MG tablet Take 500 mg by mouth daily.    . furosemide (LASIX) 20 MG tablet TAKE 1 TABLET (20 MG TOTAL) BY MOUTH DAILY AS NEEDED FOR EDEMA. 90 tablet 1   No current facility-administered medications for this visit.    Allergies:    Allergies  Allergen Reactions  . Other Anaphylaxis    [place] nuts: Stop breathing   . Oxycodone Rash  . Tyloxapol Itching and Nausea And Vomiting    Itching and vomiting  Tolerates other forms of oxycodone    Social History:  The patient  reports that he has quit smoking. His smoking use included cigarettes. He has a 5.00 pack-year smoking history. He has quit using smokeless tobacco.  His smokeless tobacco use included chew. He reports current alcohol use. He reports that he does not use drugs.   Family history:   Family History  Problem Relation Age of Onset  . Hypertension Maternal Grandmother   . Hypertension Maternal Grandfather   . Heart attack Paternal Uncle     ROS:  Pertinent items noted in HPI and remainder of comprehensive ROS otherwise negative.  PHYSICAL EXAM: VS:  BP 126/78   Pulse (!) 58   Ht 5\' 11"  (1.803 m)   Wt 273 lb (123.8 kg)   BMI 38.08 kg/m  General appearance: alert, no distress and mildly obese Neck: no carotid bruit, no JVD and thyroid not enlarged, symmetric, no tenderness/mass/nodules Lungs: clear to auscultation bilaterally Heart: regular rate and rhythm, S1, S2 normal, no murmur, click, rub or gallop Abdomen: soft, non-tender; bowel sounds normal; no masses,  no  organomegaly Extremities: extremities normal, atraumatic, no cyanosis or edema Pulses: 2+ and symmetric Skin: Skin color, texture, turgor normal. No rashes or lesions Neurologic: Grossly normal Psych: Pleasant  EKG:   Sinus bradycardia 58-personally reviewed  ASSESSMENT: 1. CAD status post anterior NSTEMI - DES to the mid-LAD with a 2.75 mm x 18 mm DES (10/2015) 2. Ischemic cardiomyopathy - EF 40-45% by cath (at 50% by echo) -> improved to 60-65% (03/2019) 3. Hypertension 4. Dyslipidemia 5. Morbid obesity with recent weight gain 6. OSA on BiPAP 7. History of depression with suicidality 8. Leg edema from venous hypertension  9. Plantar fasciits  PLAN:  Mr. Holtsclaw seems to be doing well on his current medical regimen.  He denies any further chest pain.  His LVEF has improved after stenting in 2017 with medical therapy.  Weight is still an issue and I think is primarily contributing to his shortness of breath.  Plantar fasciitis is kept him from being more active however that is improving.  I have encouraged more exercise but I think would benefit from structured weight loss program and will refer him to our weight management clinic.  Plan follow-up with me in 6 months or sooner as necessary.  2018, MD, [facility], FACP  [place]  [facility]  Medical Director of the [facility]  [facility] Diplomate of the American Board of Clinical Lipidology Attending Cardiologist  Direct Dial: [phone]  Fax: [phone]  Website:  [email or web address]

## 2020-04-28 ENCOUNTER — Other Ambulatory Visit: Payer: Self-pay

## 2020-04-28 ENCOUNTER — Encounter (INDEPENDENT_AMBULATORY_CARE_PROVIDER_SITE_OTHER): Payer: Self-pay | Admitting: Family Medicine

## 2020-04-28 ENCOUNTER — Ambulatory Visit (INDEPENDENT_AMBULATORY_CARE_PROVIDER_SITE_OTHER): Payer: No Typology Code available for payment source | Admitting: Family Medicine

## 2020-04-28 VITALS — BP 138/72 | HR 65 | Temp 97.7°F | Ht 70.0 in | Wt 277.0 lb

## 2020-04-28 DIAGNOSIS — Z0289 Encounter for other administrative examinations: Secondary | ICD-10-CM

## 2020-04-28 DIAGNOSIS — R5383 Other fatigue: Secondary | ICD-10-CM

## 2020-04-28 DIAGNOSIS — I208 Other forms of angina pectoris: Secondary | ICD-10-CM

## 2020-04-28 DIAGNOSIS — M170 Bilateral primary osteoarthritis of knee: Secondary | ICD-10-CM

## 2020-04-28 DIAGNOSIS — F3289 Other specified depressive episodes: Secondary | ICD-10-CM

## 2020-04-28 DIAGNOSIS — J45909 Unspecified asthma, uncomplicated: Secondary | ICD-10-CM

## 2020-04-28 DIAGNOSIS — Z6839 Body mass index (BMI) 39.0-39.9, adult: Secondary | ICD-10-CM

## 2020-04-28 DIAGNOSIS — G4733 Obstructive sleep apnea (adult) (pediatric): Secondary | ICD-10-CM | POA: Diagnosis not present

## 2020-04-28 DIAGNOSIS — I2510 Atherosclerotic heart disease of native coronary artery without angina pectoris: Secondary | ICD-10-CM | POA: Diagnosis not present

## 2020-04-28 DIAGNOSIS — K219 Gastro-esophageal reflux disease without esophagitis: Secondary | ICD-10-CM

## 2020-04-28 DIAGNOSIS — R0602 Shortness of breath: Secondary | ICD-10-CM

## 2020-04-28 DIAGNOSIS — Z9189 Other specified personal risk factors, not elsewhere classified: Secondary | ICD-10-CM | POA: Diagnosis not present

## 2020-04-29 LAB — VITAMIN B12: Vitamin B-12: 598 pg/mL (ref 232–1245)

## 2020-04-29 LAB — COMPREHENSIVE METABOLIC PANEL
ALT: 25 IU/L (ref 0–44)
AST: 25 IU/L (ref 0–40)
Albumin/Globulin Ratio: 1.6 (ref 1.2–2.2)
Albumin: 4.1 g/dL (ref 3.8–4.8)
Alkaline Phosphatase: 102 IU/L (ref 44–121)
BUN/Creatinine Ratio: 15 (ref 10–24)
BUN: 18 mg/dL (ref 8–27)
Bilirubin Total: 0.4 mg/dL (ref 0.0–1.2)
CO2: 26 mmol/L (ref 20–29)
Calcium: 9.2 mg/dL (ref 8.6–10.2)
Chloride: 102 mmol/L (ref 96–106)
Creatinine, Ser: 1.22 mg/dL (ref 0.76–1.27)
GFR calc Af Amer: 72 mL/min/{1.73_m2} (ref >59)
GFR calc non Af Amer: 63 mL/min/{1.73_m2} (ref >59)
Globulin, Total: 2.5 g/dL (ref 1.5–4.5)
Glucose: 97 mg/dL (ref 65–99)
Potassium: 4.6 mmol/L (ref 3.5–5.2)
Sodium: 140 mmol/L (ref 134–144)
Total Protein: 6.6 g/dL (ref 6.0–8.5)

## 2020-04-29 LAB — CBC WITH DIFFERENTIAL/PLATELET
Basophils Absolute: 0.1 10*3/uL (ref 0.0–0.2)
Basos: 1 %
EOS (ABSOLUTE): 0.4 10*3/uL (ref 0.0–0.4)
Eos: 4 %
Hematocrit: 39.6 % (ref 37.5–51.0)
Hemoglobin: 12.6 g/dL — ABNORMAL LOW (ref 13.0–17.7)
Immature Grans (Abs): 0 10*3/uL (ref 0.0–0.1)
Immature Granulocytes: 0 %
Lymphocytes Absolute: 2 10*3/uL (ref 0.7–3.1)
Lymphs: 24 %
MCH: 27.2 pg (ref 26.6–33.0)
MCHC: 31.8 g/dL (ref 31.5–35.7)
MCV: 86 fL (ref 79–97)
Monocytes Absolute: 0.7 10*3/uL (ref 0.1–0.9)
Monocytes: 8 %
Neutrophils Absolute: 5.2 10*3/uL (ref 1.4–7.0)
Neutrophils: 63 %
Platelets: 277 10*3/uL (ref 150–450)
RBC: 4.63 x10E6/uL (ref 4.14–5.80)
RDW: 13.8 % (ref 11.6–15.4)
WBC: 8.3 10*3/uL (ref 3.4–10.8)

## 2020-04-29 LAB — TSH: TSH: 4.14 u[IU]/mL (ref 0.450–4.500)

## 2020-04-29 LAB — T3: T3, Total: 122 ng/dL (ref 71–180)

## 2020-04-29 LAB — LIPID PANEL
Chol/HDL Ratio: 3.1 ratio (ref 0.0–5.0)
Cholesterol, Total: 122 mg/dL (ref 100–199)
HDL: 39 mg/dL — ABNORMAL LOW (ref >39)
LDL Chol Calc (NIH): 62 mg/dL (ref 0–99)
Triglycerides: 114 mg/dL (ref 0–149)
VLDL Cholesterol Cal: 21 mg/dL (ref 5–40)

## 2020-04-29 LAB — HEMOGLOBIN A1C
Est. average glucose Bld gHb Est-mCnc: 123 mg/dL
Hgb A1c MFr Bld: 5.9 % — ABNORMAL HIGH (ref 4.8–5.6)

## 2020-04-29 LAB — FOLATE: Folate: >20 ng/mL (ref >3.0)

## 2020-04-29 LAB — INSULIN, RANDOM: INSULIN: 43.2 u[IU]/mL — ABNORMAL HIGH (ref 2.6–24.9)

## 2020-04-29 LAB — T4: T4, Total: 6.2 ug/dL (ref 4.5–12.0)

## 2020-04-29 LAB — VITAMIN D 25 HYDROXY (VIT D DEFICIENCY, FRACTURES): Vit D, 25-Hydroxy: 32.9 ng/mL (ref 30.0–100.0)

## 2020-05-05 ENCOUNTER — Encounter (INDEPENDENT_AMBULATORY_CARE_PROVIDER_SITE_OTHER): Payer: Self-pay | Admitting: Family Medicine

## 2020-05-05 NOTE — Progress Notes (Signed)
Dear Dr. Rennis Wilkins,   Thank you for referring Patrick Wilkins to our clinic. The following note includes my evaluation and treatment recommendations.  Chief Complaint:   OBESITY Patrick Wilkins (MR# [id number]) is a 64 y.o. male who presents for evaluation and treatment of obesity and related comorbidities. Current BMI is Body mass index is 39.75 kg/m. Patrick Wilkins has been struggling with his weight for many years and has been unsuccessful in either losing weight, maintaining weight loss, or reaching his healthy weight goal.  Patrick Wilkins is currently in the action stage of change and ready to dedicate time achieving and maintaining a healthier weight. Patrick Wilkins is interested in becoming our patient and working on intensive lifestyle modifications including (but not limited to) diet and exercise for weight loss.  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  His wife, Patrick Wilkins (6 years old).  He plans to lose over 77 pounds in 6 months.  Patrick Wilkins sweets, especially at night.  Snacks on pie, cookies, and cake.  Skips breakfast and sometimes lunch.  He can go all day without eating and feel fine.  Patrick Wilkins's habits were reviewed today and are as follows: His family eats meals together, he thinks his family will eat healthier with him, his desired weight loss is 75 pounds, he started gaining weight in 2010, his heaviest weight ever was 285 pounds, he Patrick Wilkins sweets, he snacks frequently in the evenings, he skips breakfast frequently, he is frequently drinking liquids with calories, he frequently makes poor food choices, he frequently eats larger portions than normal and he struggles with emotional eating.  This is the patient's first visit at [facility].  The patient's NEW PATIENT PACKET that they filled out prior to today's office visit was reviewed at length and information from that paperwork was included within the following office visit note.    Included in the packet: current and past health history,  medications, allergies, ROS, gynecologic history (women only), surgical history, family history, social history, weight history, weight loss surgery history (for those that have had weight loss surgery), nutritional evaluation, mood and food questionnaire along with a depression screening (PHQ9) on all patients, an Epworth questionnaire, sleep habits questionnaire, patient life and health improvement goals questionnaire. These will all be scanned into the patient's chart under media.   During the visit, I independently reviewed the patient's EKG, bioimpedance scale results, and indirect calorimeter results. I used this information to tailor a meal plan for the patient that will help Patrick Wilkins to lose weight and will improve his obesity-related conditions going forward.  I performed a medically necessary appropriate examination and/or evaluation. I discussed the assessment and treatment plan with the patient. The patient was provided an opportunity to ask questions and all were answered. The patient agreed with the plan and demonstrated an understanding of the instructions. Labs were ordered today (unless patient declined them) and will be reviewed with the patient at our next visit unless more critical results need to be addressed immediately. Clinical information was updated and documented in the EMR.  Time spent on visit including pre-visit chart review and post-visit care was estimated to be 60-74 minutes.  A separate 15 minutes was spent on risk counseling (see above/below).   Depression Screen Patrick Wilkins's Food and Mood (modified PHQ-9) score was 20.  Depression screen PHQ 2/9 04/28/2020  Decreased Interest 2  Down, Depressed, Hopeless 3  PHQ - 2 Score 5  Altered sleeping 2  Tired, decreased energy 3  Change in appetite 2  Feeling bad or failure about yourself  3  Trouble concentrating 2  Moving slowly or fidgety/restless 2  Suicidal thoughts 1  PHQ-9 Score 20  Difficult doing work/chores Somewhat  difficult   Assessment/Plan:   1. Other fatigue Patrick Wilkins admits to daytime somnolence and reports waking up still tired. Patent has a history of symptoms of daytime fatigue, morning fatigue and snoring. Patrick Wilkins generally gets 7 hours of sleep per night, and states that he has generally restful sleep. Snoring is present. Apneic episodes are present. Epworth Sleepiness Score is 18.  Patrick Wilkins does feel that his weight is causing his energy to be lower than it should be. Fatigue may be related to obesity, depression or many other causes. Labs will be ordered, and in the meanwhile, Patrick Wilkins will focus on self care including making healthy food choices, increasing physical activity and focusing on stress reduction.  - Vitamin B12 - CBC with Differential/Platelet - Comprehensive metabolic panel - Folate - Hemoglobin A1c - Insulin, random - Lipid panel - T3 - T4 - TSH - VITAMIN D 25 Hydroxy (Vit-D Deficiency, Fractures)   2. SOB (shortness of breath) on exertion Patrick Wilkins notes increasing shortness of breath with exercising and seems to be worsening over time with weight gain. He notes getting out of breath sooner with activity than he used to. This has gotten worse recently. Patrick Wilkins denies shortness of breath at rest or orthopnea.  Patrick Wilkins does feel that he gets out of breath more easily that he used to when he exercises. Patrick Wilkins's shortness of breath appears to be obesity related and exercise induced. He has agreed to work on weight loss and gradually increase exercise to treat his exercise induced shortness of breath. Will continue to monitor closely.  - Vitamin B12 - CBC with Differential/Platelet - Comprehensive metabolic panel - Folate - Hemoglobin A1c - Insulin, random - Lipid panel - T3 - T4 - TSH - VITAMIN D 25 Hydroxy (Vit-D Deficiency, Fractures)   3. CAD in native artery s/p AMI Patrick Wilkins had a stent placed in 10/2015.  He is taking Coreg and Cozaar, and has nitroglycerin on hand if needed.   Denies CPs today.  Plan:  Check labs today.    4. OSA treated with BiPAP Intensive lifestyle modifications are the first line treatment for this issue. We discussed several lifestyle modifications today and he will continue to work on diet, exercise and weight loss efforts. We will continue to monitor. Orders and follow up as documented in patient record.   Plan:  Continue using BiPAP nightly.  Will check labs today.  Epworth sleepiness score is 18.  - Vitamin B12 - CBC with Differential/Platelet - Comprehensive metabolic panel - Folate - Hemoglobin A1c - Insulin, random - Lipid panel - T3 - T4 - TSH - VITAMIN D 25 Hydroxy (Vit-D Deficiency, Fractures)   5. Gastroesophageal reflux disease, unspecified whether esophagitis present Patrick Wilkins takes famotidine for GERD symptoms.  Plan:  Continue famotidine.  Will check labs today.  Avoid triggering foods.   6. Asthma, unspecified asthma severity, unspecified whether complicated, unspecified whether persistent Patrick Wilkins has Proair to use for rescue as needed.    Plan:  Continue using Proair as needed.   7. Osteoarthritis of both knees, unspecified osteoarthritis type Status post right TKA in 2020.    Plan:  Will follow because mobility and pain control are important for weight management.   8. Other depression with emotional eating PHQ-9 is 20.  Denies SI.  She has  taken clonazepam in the past for anxiety/panic.  Plan:  Behavior modification techniques were discussed today to help Patrick Wilkins deal with his emotional/non-hunger eating behaviors.  Orders and follow up as documented in patient record.   - Vitamin B12 - CBC with Differential/Platelet - Comprehensive metabolic panel - Folate - Hemoglobin A1c - Insulin, random - Lipid panel - T3 - T4 - TSH - VITAMIN D 25 Hydroxy (Vit-D Deficiency, Fractures)   9. At risk for diabetes mellitus - Patrick Wilkins was given extensive diabetes prevention education and counseling today of more than  26 minutes.  - Counseled patient on pathophysiology of disease and discussed various treatment options which always includes dietary and lifestyle modification as first line.   - Importance of healthy diet with very limited amounts of simple carbohydrates discussed with patient in addition to regular aerobic exercise to an eventual goal of 5d/week or more.  - Handouts provided at patient's desire and or told to go online at the American Diabetes Association website for further information   10. Class 2 severe obesity with serious comorbidity and body mass index (BMI) of 39.0 to 39.9 in adult, unspecified obesity type (HCC)  Patrick Wilkins is currently in the action stage of change and his goal is to continue with weight loss efforts. I recommend Patrick Wilkins begin the structured treatment plan as follows:  He has agreed to the Category 2 Plan.  Exercise goals: As is.   Behavioral modification strategies: meal planning and cooking strategies, keeping healthy foods in the home and planning for success.  He was informed of the importance of frequent follow-up visits to maximize his success with intensive lifestyle modifications for his multiple health conditions. He was informed we would discuss his lab results at his next visit unless there is a critical issue that needs to be addressed sooner. Patrick Wilkins agreed to keep his next visit at the agreed upon time to discuss these results.  Objective:   Blood pressure 138/72, pulse 65, temperature 97.7 F (36.5 C), height 5\' 10"  (1.778 m), weight 277 lb (125.6 kg), SpO2 98 %. Body mass index is 39.75 kg/m.  Indirect Calorimeter completed today shows a VO2 of 306 and a REE of 2130.  His calculated basal metabolic rate is 2131 thus his basal metabolic rate is worse than expected.  General: Cooperative, alert, well developed, in no acute distress. HEENT: Conjunctivae and lids unremarkable. Cardiovascular: Regular rhythm.  Lungs: Normal work of  breathing. Neurologic: No focal deficits.   Lab Results  Component Value Date   CREATININE 1.22 04/28/2020   BUN 18 04/28/2020   NA 140 04/28/2020   K 4.6 04/28/2020   CL 102 04/28/2020   CO2 26 04/28/2020   Lab Results  Component Value Date   ALT 25 04/28/2020   AST 25 04/28/2020   ALKPHOS 102 04/28/2020   BILITOT 0.4 04/28/2020   Lab Results  Component Value Date   HGBA1C 5.9 (H) 04/28/2020   Lab Results  Component Value Date   INSULIN 43.2 (H) 04/28/2020   Lab Results  Component Value Date   TSH 4.140 04/28/2020   Lab Results  Component Value Date   CHOL 122 04/28/2020   HDL 39 (L) 04/28/2020   LDLCALC 62 04/28/2020   TRIG 114 04/28/2020   CHOLHDL 3.1 04/28/2020   Lab Results  Component Value Date   WBC 8.3 04/28/2020   HGB 12.6 (L) 04/28/2020   HCT 39.6 04/28/2020   MCV 86 04/28/2020   PLT 277 04/28/2020  Attestation Statements:   Reviewed by clinician on day of visit: allergies, medications, problem list, medical history, surgical history, family history, social history, and previous encounter notes.  I, Insurance claims handler, CMA, am acting as Energy manager for [employer], DO.  I have reviewed the above documentation for accuracy and completeness, and I agree with the above. Patrick Wilkins, D.O.  The 21st Century Cures Act was signed into law in 2016 which includes the topic of electronic health records.  This provides immediate access to information in MyChart.  This includes consultation notes, operative notes, office notes, lab results and pathology reports.  If you have any questions about what you read please let us know at your next visit so we can discuss your concerns and take corrective action if need be.  We are right here with you.

## 2020-05-12 ENCOUNTER — Ambulatory Visit (INDEPENDENT_AMBULATORY_CARE_PROVIDER_SITE_OTHER): Payer: No Typology Code available for payment source | Admitting: Family Medicine

## 2020-05-12 ENCOUNTER — Encounter (INDEPENDENT_AMBULATORY_CARE_PROVIDER_SITE_OTHER): Payer: Self-pay | Admitting: Family Medicine

## 2020-05-12 ENCOUNTER — Other Ambulatory Visit: Payer: Self-pay

## 2020-05-12 VITALS — BP 121/69 | HR 59 | Temp 97.6°F | Ht 70.0 in | Wt 261.0 lb

## 2020-05-12 DIAGNOSIS — I10 Essential (primary) hypertension: Secondary | ICD-10-CM

## 2020-05-12 DIAGNOSIS — E559 Vitamin D deficiency, unspecified: Secondary | ICD-10-CM

## 2020-05-12 DIAGNOSIS — R7303 Prediabetes: Secondary | ICD-10-CM

## 2020-05-12 DIAGNOSIS — E7849 Other hyperlipidemia: Secondary | ICD-10-CM | POA: Diagnosis not present

## 2020-05-12 DIAGNOSIS — Z6837 Body mass index (BMI) 37.0-37.9, adult: Secondary | ICD-10-CM

## 2020-05-12 MED ORDER — VITAMIN D (ERGOCALCIFEROL) 1.25 MG (50000 UNIT) PO CAPS: 50000.0000 [IU] | ORAL_CAPSULE | ORAL | 0 refills | Status: DC

## 2020-05-13 NOTE — Progress Notes (Signed)
Chief Complaint:   OBESITY Patrick Wilkins is here to discuss his progress with his obesity treatment plan along with follow-up of his obesity related diagnoses. Patrick Wilkins is on the Category 2 Plan and states he is following his eating plan approximately 90% of the time. Patrick Wilkins states he is using the stationary bike for 5 minutes 1 times per week.  Today's visit was #: 2 Starting weight: 277 lbs Starting date: 04/28/2020 Today's weight: 261 lbs Today's date: 05/12/2020 Total lbs lost to date: 16 lbs Total lbs lost since last in-office visit: 16 lbs Total weight loss percentage to date: -5.78%  Interim History:  This is Patrick Wilkins's first follow-up visit with [place].  He has "kept a food journal to keep myself on track".  He says the plan was easy to follow.  No cravings.  He says he has hunger every one in awhile, but it is because he does not eat everything on those days.  Yogurt for snacks.  He feels like he lost a lot of water weight (appears total body water in pounds has changed/decreased by 10 pounds this office visit versus 2 weeks ago).    Assessment/Plan:   1. Other hyperlipidemia Discussed labs with patient today.  Patrick Wilkins has hyperlipidemia and has been trying to improve his cholesterol levels with intensive lifestyle modification including a low saturated fat diet, exercise and weight loss. He denies any chest pain, claudication or myalgias.  He is taking Lipitor and tolerating it well without side effects or concerns.  Plan:  FLP essentially at goal.  Decrease saturated fats (cut out sausage and pork BBQ) and only eat lean 90-93%, fat-free proteins per prudent nutritional plan.  Lab Results  Component Value Date   ALT 25 04/28/2020   AST 25 04/28/2020   ALKPHOS 102 04/28/2020   BILITOT 0.4 04/28/2020   Lab Results  Component Value Date   CHOL 122 04/28/2020   HDL 39 (L) 04/28/2020   LDLCALC 62 04/28/2020   TRIG 114 04/28/2020   CHOLHDL 3.1 04/28/2020   2. Prediabetes New.   Discussed labs with patient today.  Patrick Wilkins has a diagnosis of prediabetes based on his elevated HgA1c and was informed this puts him at greater risk of developing diabetes. He continues to work on diet and exercise to decrease his risk of diabetes. He denies nausea or hypoglycemia.  Never told he had sugar problem in the past.  He "loves sweets".  Likes cakes, cookies, pies, candy, and ice cream, especially at night.  He is upset by this news and wants to make the necessary changes to prevent diabetes.  Plan:  Handouts provided on prediabetes after extensive education done.  Decrease simple carbs, follow plan, lose weight.  Lab Results  Component Value Date   HGBA1C 5.9 (H) 04/28/2020   Lab Results  Component Value Date   INSULIN 43.2 (H) 04/28/2020   3. Essential hypertension Discussed labs with patient today.  Review: taking medications as instructed, no medication side effects noted, no chest pain on exertion, no dyspnea on exertion, no swelling of ankles.  He is on Lasix, losartan, and Coreg.  Denies dizziness/lightheadedness.  Checks at home and it is controlled.  Plan:  Blood pressure is at goal as per Cardiology.  He has known CAD.  Keep home blood pressure monitoring and check more frequently as he loses weight.  BP Readings from Last 3 Encounters:  05/12/20 121/69  04/28/20 138/72  04/06/20 126/78   4. Vitamin D deficiency New.  Discussed labs with patient today.  Patrick Wilkins's Vitamin D level was 32.9 on 04/28/2020. He is currently taking no vitamin D supplement. He denies nausea, vomiting or muscle weakness.  He has never been told his vitamin D level was low.  Plan:  Start vitamin D, as per below.  Recheck level in 3-4 months.  -Start Vitamin D, Ergocalciferol, (DRISDOL) 1.25 MG (50000 UNIT) CAPS capsule; Take 1 capsule (50,000 Units total) by mouth every 7 (seven) days.  Dispense: 4 capsule; Refill: 0  5. Class 2 severe obesity with serious comorbidity and body mass index (BMI) of  37.0 to 37.9 in adult, unspecified obesity type (HCC)  Patrick Wilkins is currently in the action stage of change. As such, his goal is to continue with weight loss efforts. He has agreed to the Category 2 Plan plus add 100 calorie 10:1 ratio snack.  Breakfast options sheet given to him as well.   Exercise goals: As is.  Behavioral modification strategies: increasing lean protein intake, decreasing simple carbohydrates, decreasing liquid calories, decreasing sodium intake, no skipping meals, meal planning and cooking strategies, keeping healthy foods in the home, better snacking choices and planning for success.  Patrick Wilkins has agreed to follow-up with our clinic in 2 weeks. He was informed of the importance of frequent follow-up visits to maximize his success with intensive lifestyle modifications for his multiple health conditions.   Objective:   Blood pressure 121/69, pulse (!) 59, temperature 97.6 F (36.4 C), height 5\' 10"  (1.778 m), weight 261 lb (118.4 kg), SpO2 100 %. Body mass index is 37.45 kg/m.  General: Cooperative, alert, well developed, in no acute distress. HEENT: Conjunctivae and lids unremarkable. Cardiovascular: Regular rhythm.  Lungs: Normal work of breathing. Neurologic: No focal deficits.   Lab Results  Component Value Date   CREATININE 1.22 04/28/2020   BUN 18 04/28/2020   NA 140 04/28/2020   K 4.6 04/28/2020   CL 102 04/28/2020   CO2 26 04/28/2020   Lab Results  Component Value Date   ALT 25 04/28/2020   AST 25 04/28/2020   ALKPHOS 102 04/28/2020   BILITOT 0.4 04/28/2020   Lab Results  Component Value Date   HGBA1C 5.9 (H) 04/28/2020   Lab Results  Component Value Date   INSULIN 43.2 (H) 04/28/2020   Lab Results  Component Value Date   TSH 4.140 04/28/2020   Lab Results  Component Value Date   CHOL 122 04/28/2020   HDL 39 (L) 04/28/2020   LDLCALC 62 04/28/2020   TRIG 114 04/28/2020   CHOLHDL 3.1 04/28/2020   Lab Results  Component Value Date   WBC  8.3 04/28/2020   HGB 12.6 (L) 04/28/2020   HCT 39.6 04/28/2020   MCV 86 04/28/2020   PLT 277 04/28/2020   Attestation Statements:   Reviewed by clinician on day of visit: allergies, medications, problem list, medical history, surgical history, family history, social history, and previous encounter notes.  Time spent on visit including pre-visit chart review and post-visit care and charting was 50+ minutes.   I, 04/30/2020, CMA, am acting as Insurance claims handler for Energy manager, DO.  I have reviewed the above documentation for accuracy and completeness, and I agree with the above. [employer], D.O.  The 21st Century Cures Act was signed into law in 2016 which includes the topic of electronic health records.  This provides immediate access to information in MyChart.  This includes consultation notes, operative notes, office notes, lab results and pathology reports.  If you have any questions about what you read please let us know at your next visit so we can discuss your concerns and take corrective action if need be.  We are right here with you.

## 2020-05-26 ENCOUNTER — Other Ambulatory Visit: Payer: Self-pay

## 2020-05-26 ENCOUNTER — Encounter (INDEPENDENT_AMBULATORY_CARE_PROVIDER_SITE_OTHER): Payer: Self-pay | Admitting: Family Medicine

## 2020-05-26 ENCOUNTER — Ambulatory Visit (INDEPENDENT_AMBULATORY_CARE_PROVIDER_SITE_OTHER): Payer: No Typology Code available for payment source | Admitting: Family Medicine

## 2020-05-26 VITALS — BP 107/65 | HR 66 | Temp 97.9°F | Ht 70.0 in | Wt 253.0 lb

## 2020-05-26 DIAGNOSIS — Z6836 Body mass index (BMI) 36.0-36.9, adult: Secondary | ICD-10-CM

## 2020-05-26 DIAGNOSIS — R7303 Prediabetes: Secondary | ICD-10-CM

## 2020-05-26 DIAGNOSIS — E559 Vitamin D deficiency, unspecified: Secondary | ICD-10-CM | POA: Diagnosis not present

## 2020-05-26 DIAGNOSIS — Z9189 Other specified personal risk factors, not elsewhere classified: Secondary | ICD-10-CM | POA: Insufficient documentation

## 2020-05-26 MED ORDER — VITAMIN D (ERGOCALCIFEROL) 1.25 MG (50000 UNIT) PO CAPS: 50000.0000 [IU] | ORAL_CAPSULE | ORAL | 0 refills | Status: DC

## 2020-06-01 NOTE — Progress Notes (Signed)
Chief Complaint:   OBESITY Malvin is here to discuss his progress with his obesity treatment plan along with follow-up of his obesity related diagnoses. Armistead is on the Category 2 Plan +100 calorie 10:1 ratio snack and states he is following his eating plan approximately 90% of the time. Bralon states he is using the stationary bike, hunting deer, and walking for 5 minutes 2-3 times per week.  Today's visit was #: 3 Starting weight: 277 lbs Starting date: 04/28/2020 Today's weight: 253 lbs Today's date: 05/26/2020 Total lbs lost to date: 24 lbs Total lbs lost since last in-office visit: 8 lbs Total weight loss percentage to date: -8.66%  Interim History: Carvin says his hunger is well controlled.  Eating only certain foods on plan, but not all.  He states he eats only 600-700 calories per day on some days.  He has lost a total of 24 pounds in the past 4 weeks, which is almost 8.5% per month.    I explained to him that he needs to eat more and I realize that he does not want to, but it will slow his weight loss in the long run.  Assessment/Plan:   1. Prediabetes Camry has a diagnosis of prediabetes based on his elevated HgA1c and was informed this puts him at greater risk of developing diabetes. He continues to work on diet and exercise to decrease his risk of diabetes. He denies nausea or hypoglycemia.    Plan:  - I reiterated and again counseled patient on pathophysiology of the disease process of Pre-DM.    - Stressed importance of dietary and lifestyle modifications resulting in weight loss as first line. - continue to decrease simple carbs; increase fiber and proteins - Handouts provided at pt's request after education provided.  All concerns/questions addressed.   - Anticipatory guidance given.  Recheck A1c and fasting insulin level in approximately 3 months from last check or as deemed fit.   Lab Results  Component Value Date   HGBA1C 5.9 (H) 04/28/2020   Lab Results    Component Value Date   INSULIN 43.2 (H) 04/28/2020   2. Vitamin D deficiency Rahn's Vitamin D level was 32.9 on 04/28/2020. He is currently taking prescription vitamin D 50,000 IU each week. He denies nausea, vomiting or muscle weakness.  Plan: - Reiterated importance of vitamin D (as well as calcium) to their health and wellbeing.  - Reminded pt that weight loss will likely improve availability of vitamin D, thus encouraged Maisha to continue with meal plan and their weight loss efforts to further improve this condition. - I recommend pt continue to take weekly prescription vit D 50,000 IU - Informed patient this may be a lifelong thing, and she was encouraged to continue to take the medicine until told otherwise.   - We will need to monitor levels regularly (every 3-4 mo on average) to keep levels within normal limits.  - pt's questions and concerns regarding this condition addressed. -Refill Vitamin D, Ergocalciferol, (DRISDOL) 1.25 MG (50000 UNIT) CAPS capsule; Take 1 capsule (50,000 Units total) by mouth every 7 (seven) days.  Dispense: 4 capsule; Refill: 0  3. At risk for diabetes mellitus - Johnell was given extensive diabetes prevention education and counseling today of more than 9 minutes.  - Counseled patient on pathophysiology of disease and discussed various treatment options which always includes dietary and lifestyle modification as first line.   - Importance of healthy diet with very limited amounts of simple carbohydrates  discussed with patient in addition to regular aerobic exercise to an eventual goal of 5d/week or more.  - Handouts provided at patient's desire and or told to go online at the American Diabetes Association website for further information  4. Class 2 severe obesity with serious comorbidity and body mass index (BMI) of 36.0 to 36.9 in adult, unspecified obesity type (HCC)  Bryer is currently in the action stage of change. As such, his goal is to continue  with weight loss efforts. He has agreed to the Category 2 Plan +100 calories of a 10:1 ratio snack.   Exercise goals: As is.  Behavioral modification strategies: meal planning and cooking strategies, holiday eating strategies  and planning for success.  Christoher has agreed to follow-up with our clinic in 2 weeks. He was informed of the importance of frequent follow-up visits to maximize his success with intensive lifestyle modifications for his multiple health conditions.   Objective:   Blood pressure 107/65, pulse 66, temperature 97.9 F (36.6 C), height 5\' 10"  (1.778 m), weight 253 lb (114.8 kg), SpO2 95 %. Body mass index is 36.3 kg/m.  General: Cooperative, alert, well developed, in no acute distress. HEENT: Conjunctivae and lids unremarkable. Cardiovascular: Regular rhythm.  Lungs: Normal work of breathing. Neurologic: No focal deficits.   Lab Results  Component Value Date   CREATININE 1.22 04/28/2020   BUN 18 04/28/2020   NA 140 04/28/2020   K 4.6 04/28/2020   CL 102 04/28/2020   CO2 26 04/28/2020   Lab Results  Component Value Date   ALT 25 04/28/2020   AST 25 04/28/2020   ALKPHOS 102 04/28/2020   BILITOT 0.4 04/28/2020   Lab Results  Component Value Date   HGBA1C 5.9 (H) 04/28/2020   Lab Results  Component Value Date   INSULIN 43.2 (H) 04/28/2020   Lab Results  Component Value Date   TSH 4.140 04/28/2020   Lab Results  Component Value Date   CHOL 122 04/28/2020   HDL 39 (L) 04/28/2020   LDLCALC 62 04/28/2020   TRIG 114 04/28/2020   CHOLHDL 3.1 04/28/2020   Lab Results  Component Value Date   WBC 8.3 04/28/2020   HGB 12.6 (L) 04/28/2020   HCT 39.6 04/28/2020   MCV 86 04/28/2020   PLT 277 04/28/2020   Attestation Statements:   Reviewed by clinician on day of visit: allergies, medications, problem list, medical history, surgical history, family history, social history, and previous encounter notes.  I, 04/30/2020, CMA, am acting as  Insurance claims handler for Energy manager, DO.  I have reviewed the above documentation for accuracy and completeness, and I agree with the above. -  *[employer], D.O.  The 21st Century Cures Act was signed into law in 2016 which includes the topic of electronic health records.  This provides immediate access to information in MyChart.  This includes consultation notes, operative notes, office notes, lab results and pathology reports.  If you have any questions about what you read please let 2017 know at your next visit so we can discuss your concerns and take corrective action if need be.  We are right here with you.

## 2020-06-08 ENCOUNTER — Other Ambulatory Visit: Payer: Self-pay | Admitting: Internal Medicine

## 2020-06-08 DIAGNOSIS — I10 Essential (primary) hypertension: Secondary | ICD-10-CM

## 2020-06-09 ENCOUNTER — Ambulatory Visit (INDEPENDENT_AMBULATORY_CARE_PROVIDER_SITE_OTHER): Payer: No Typology Code available for payment source | Admitting: Family Medicine

## 2020-06-09 ENCOUNTER — Other Ambulatory Visit: Payer: Self-pay

## 2020-06-09 ENCOUNTER — Encounter (INDEPENDENT_AMBULATORY_CARE_PROVIDER_SITE_OTHER): Payer: Self-pay | Admitting: Family Medicine

## 2020-06-09 VITALS — BP 109/68 | HR 62 | Temp 97.7°F | Ht 70.0 in | Wt 252.0 lb

## 2020-06-09 DIAGNOSIS — Z9189 Other specified personal risk factors, not elsewhere classified: Secondary | ICD-10-CM | POA: Diagnosis not present

## 2020-06-09 DIAGNOSIS — I10 Essential (primary) hypertension: Secondary | ICD-10-CM

## 2020-06-09 DIAGNOSIS — E559 Vitamin D deficiency, unspecified: Secondary | ICD-10-CM | POA: Diagnosis not present

## 2020-06-09 DIAGNOSIS — R7303 Prediabetes: Secondary | ICD-10-CM

## 2020-06-09 DIAGNOSIS — Z6836 Body mass index (BMI) 36.0-36.9, adult: Secondary | ICD-10-CM

## 2020-06-10 NOTE — Progress Notes (Signed)
Chief Complaint:   OBESITY Patrick Wilkins is here to discuss his progress with his obesity treatment plan along with follow-up of his obesity related diagnoses. Patrick Wilkins is on the Category 2 Plan plus 100 calories of 10:1 ratio snack and states he is following his eating plan approximately 80% of the time. Patrick Wilkins states he is walking 5 minutes 3 times per week.  Today's visit was #: 4 Starting weight: 277 lbs Starting date: 04/28/2020 Today's weight: 252 lbs Today's date: 06/09/2020 Total lbs lost to date: 25 lbs Total lbs lost since last in-office visit: 1 lb Total weight loss percentage to date: -9.03%  Interim History: Patrick Wilkins notes that he was up in weight over Thanksgiving but after the holiday was over he got right back on track and lost it. He reports no issues or concerns with the meal plan. His hunger and cravings are controlled, and he has no concerns today.  Assessment/Plan:   1. Vitamin D deficiency Patrick Wilkins's Vitamin D level was 32.9 on 04/28/2020. He is currently taking prescription vitamin D 50,000 IU each week. He denies nausea, vomiting or muscle weakness.  Plan: Continue Vit D as written. Patrick Wilkins declines a refill today.  2. Essential hypertension Patrick Wilkins is prescribed Coreg, Cozaar, and Lasix, per cardiologist, Dr. Rennis Golden. He is taking all medication as written and denies concerns or symptoms.  3. Pre-diabetes Patrick Wilkins has an elevated A1c of 5.9 and fasting insulin of 43.2 as of 04/28/2020. He is not currently on any medication to aid in the prevention of diabetes.  Plan: Will consider adding medication to aid with blood sugar control and weight loss in the future as/if needed. Recheck A1c and fasting insulin around January 19/2022.  4. At risk for diabetes mellitus Patrick Wilkins was given approximately 9 minutes of diabetes education and counseling today. We discussed intensive lifestyle modifications today with an emphasis on weight loss as well as increasing exercise and decreasing simple  carbohydrates in his diet. We also reviewed medication options with an emphasis on risk versus benefit of those discussed.   5. Class 2 severe obesity with serious comorbidity and body mass index (BMI) of 36.0 to 36.9 in adult, unspecified obesity type (HCC) Patrick Wilkins is currently in the action stage of change. As such, his goal is to continue with weight loss efforts. He has agreed to the Category 2 Plan plus 100 calories of 10:1 ratio snack.   Exercise goals: Increase as tolerated. For substantial health benefits, adults should do at least 150 minutes (2 hours and 30 minutes) a week of moderate-intensity, or 75 minutes (1 hour and 15 minutes) a week of vigorous-intensity aerobic physical activity, or an equivalent combination of moderate- and vigorous-intensity aerobic activity. Aerobic activity should be performed in episodes of at least 10 minutes, and preferably, it should be spread throughout the week.  Behavioral modification strategies: increasing water intake, meal planning and cooking strategies and holiday eating strategies .  Patrick Wilkins has agreed to follow-up with our clinic in 2-3 weeks. He was informed of the importance of frequent follow-up visits to maximize his success with intensive lifestyle modifications for his multiple health conditions.   Objective:   Blood pressure 109/68, pulse 62, temperature 97.7 F (36.5 C), height 5\' 10"  (1.778 m), weight 252 lb (114.3 kg), SpO2 94 %. Body mass index is 36.16 kg/m.  General: Cooperative, alert, well developed, in no acute distress. HEENT: Conjunctivae and lids unremarkable. Cardiovascular: Regular rhythm.  Lungs: Normal work of breathing. Neurologic: No focal deficits.  Lab Results  Component Value Date   CREATININE 1.22 04/28/2020   BUN 18 04/28/2020   NA 140 04/28/2020   K 4.6 04/28/2020   CL 102 04/28/2020   CO2 26 04/28/2020   Lab Results  Component Value Date   ALT 25 04/28/2020   AST 25 04/28/2020   ALKPHOS 102  04/28/2020   BILITOT 0.4 04/28/2020   Lab Results  Component Value Date   HGBA1C 5.9 (H) 04/28/2020   Lab Results  Component Value Date   INSULIN 43.2 (H) 04/28/2020   Lab Results  Component Value Date   TSH 4.140 04/28/2020   Lab Results  Component Value Date   CHOL 122 04/28/2020   HDL 39 (L) 04/28/2020   LDLCALC 62 04/28/2020   TRIG 114 04/28/2020   CHOLHDL 3.1 04/28/2020   Lab Results  Component Value Date   WBC 8.3 04/28/2020   HGB 12.6 (L) 04/28/2020   HCT 39.6 04/28/2020   MCV 86 04/28/2020   PLT 277 04/28/2020    Attestation Statements:   Reviewed by clinician on day of visit: allergies, medications, problem list, medical history, surgical history, family history, social history, and previous encounter notes.  Edmund Hilda, am acting as Energy manager for [employer], DO.  I have reviewed the above documentation for accuracy and completeness, and I agree with the above. Carlye Grippe, D.O.  The 21st Century Cures Act was signed into law in 2016 which includes the topic of electronic health records.  This provides immediate access to information in MyChart.  This includes consultation notes, operative notes, office notes, lab results and pathology reports.  If you have any questions about what you read please let us know at your next visit so we can discuss your concerns and take corrective action if need be.  We are right here with you.

## 2020-06-23 ENCOUNTER — Ambulatory Visit (INDEPENDENT_AMBULATORY_CARE_PROVIDER_SITE_OTHER): Payer: No Typology Code available for payment source | Admitting: Family Medicine

## 2020-06-23 ENCOUNTER — Other Ambulatory Visit: Payer: Self-pay

## 2020-06-23 ENCOUNTER — Encounter (INDEPENDENT_AMBULATORY_CARE_PROVIDER_SITE_OTHER): Payer: Self-pay | Admitting: Family Medicine

## 2020-06-23 VITALS — BP 121/68 | HR 66 | Temp 97.8°F | Ht 70.0 in | Wt 245.0 lb

## 2020-06-23 DIAGNOSIS — R7303 Prediabetes: Secondary | ICD-10-CM

## 2020-06-23 DIAGNOSIS — Z6835 Body mass index (BMI) 35.0-35.9, adult: Secondary | ICD-10-CM

## 2020-06-23 DIAGNOSIS — Z9189 Other specified personal risk factors, not elsewhere classified: Secondary | ICD-10-CM

## 2020-06-23 DIAGNOSIS — E559 Vitamin D deficiency, unspecified: Secondary | ICD-10-CM

## 2020-06-23 DIAGNOSIS — G629 Polyneuropathy, unspecified: Secondary | ICD-10-CM | POA: Diagnosis not present

## 2020-06-23 MED ORDER — VITAMIN D (ERGOCALCIFEROL) 1.25 MG (50000 UNIT) PO CAPS: 50000.0000 [IU] | ORAL_CAPSULE | ORAL | 0 refills | Status: AC

## 2020-06-24 NOTE — Progress Notes (Signed)
Chief Complaint:   OBESITY Yuma is here to discuss his progress with his obesity treatment plan along with follow-up of his obesity related diagnoses. Andrell is on the Category 2 Plan + 100 calories of 10:1 ratio snack and states he is following his eating plan approximately 80% of the time. Jerrard states he is walking 5 minutes 2-3 times per week.  Today's visit was #: 5 Starting weight: 277 lbs Starting date: 04/28/2020 Today's weight: 245 lbs Today's date: 06/23/2020 Total lbs lost to date: 32 lbs Total lbs lost since last in-office visit: 7 lbs Total weight loss percentage to date: -11.55%  Interim History: Mckale is following the plan. He reports that he eats a 1/2 cup of pistachios as a snack.  Assessment/Plan:   Meds ordered this encounter  Medications  . Vitamin D, Ergocalciferol, (DRISDOL) 1.25 MG (50000 UNIT) CAPS capsule    Sig: Take 1 capsule (50,000 Units total) by mouth every 7 (seven) days.    Dispense:  4 capsule    Refill:  0    Lab Orders  No laboratory test(s) ordered today     1. Polyneuropathy Rayane was recently seen by podiatry and they increased his neurontin.  Lab Results  Component Value Date   NA 140 04/28/2020   K 4.6 04/28/2020   CO2 26 04/28/2020   GLUCOSE 97 04/28/2020   BUN 18 04/28/2020   CREATININE 1.22 04/28/2020   CALCIUM 9.2 04/28/2020   GFRNONAA 63 04/28/2020   GFRAA 72 04/28/2020   Lab Results  Component Value Date   VITAMINB12 598 04/28/2020   Plan: I recommend Kahron follow up with PCP to discuss further assessment and evaluation of his neuropathy- ie. Whether it is from a peripheral etiology or central etiology.  2. Pre-diabetes Treyvin has a diagnosis of prediabetes based on his elevated HgA1c and was informed this puts him at greater risk of developing diabetes. He continues to work on diet and exercise to decrease his risk of diabetes. He denies nausea or hypoglycemia.  Lab Results  Component Value Date   HGBA1C  5.9 (H) 04/28/2020   Lab Results  Component Value Date   INSULIN 43.2 (H) 04/28/2020   Plan: Nic will continue to work on weight loss, exercise, and decreasing simple carbohydrates to help decrease the risk of diabetes.   3. Vitamin D deficiency Kyshon's Vitamin D level was 32.9 on 04/28/2020. He is currently taking prescription vitamin D 50,000 IU each week. He denies nausea, vomiting or muscle weakness.   Ref. Range 04/28/2020 11:22  Vitamin D, 25-Hydroxy Latest Ref Range: 30.0 - 100.0 ng/mL 32.9   Plan: Refill Vit D for 1 month, as per below. Low Vitamin D level contributes to fatigue and are associated with obesity, breast, and colon cancer. He agrees to continue to take prescription Vitamin D @50 ,000 IU every week and will follow-up for routine testing of Vitamin D, at least 2-3 times per year to avoid over-replacement.  Refill- Vitamin D, Ergocalciferol, (DRISDOL) 1.25 MG (50000 UNIT) CAPS capsule; Take 1 capsule (50,000 Units total) by mouth every 7 (seven) days.  Dispense: 4 capsule; Refill: 0  4. At risk for activity intolerance Kamarrion was given approximately 15 minutes of exercise intolerance counseling today. He is 64 y.o. male and has risk factors exercise intolerance including obesity. We discussed intensive lifestyle modifications today with an emphasis on specific weight loss instructions and strategies. Mahmoud will slowly increase activity as tolerated.  5. Class 2 severe obesity with  serious comorbidity and body mass index (BMI) of 35.0 to 35.9 in adult, unspecified obesity type (HCC) Kolton is currently in the action stage of change. As such, his goal is to continue with weight loss efforts. He has agreed to the Category 2 Plan + 100 calories 10:1 ratio snack.   Exercise goals: As is  Behavioral modification strategies: increasing lean protein intake, decreasing simple carbohydrates, increasing water intake, no skipping meals, meal planning and cooking strategies and  planning for success.  Giancarlos has agreed to follow-up with our clinic in 3 weeks. He was informed of the importance of frequent follow-up visits to maximize his success with intensive lifestyle modifications for his multiple health conditions.   Objective:   Blood pressure 121/68, pulse 66, temperature 97.8 F (36.6 C), height 5\' 10"  (1.778 m), weight 245 lb (111.1 kg), SpO2 95 %. Body mass index is 35.15 kg/m.  General: Cooperative, alert, well developed, in no acute distress. HEENT: Conjunctivae and lids unremarkable. Cardiovascular: Regular rhythm.  Lungs: Normal work of breathing. Neurologic: No focal deficits.   Lab Results  Component Value Date   CREATININE 1.22 04/28/2020   BUN 18 04/28/2020   NA 140 04/28/2020   K 4.6 04/28/2020   CL 102 04/28/2020   CO2 26 04/28/2020   Lab Results  Component Value Date   ALT 25 04/28/2020   AST 25 04/28/2020   ALKPHOS 102 04/28/2020   BILITOT 0.4 04/28/2020   Lab Results  Component Value Date   HGBA1C 5.9 (H) 04/28/2020   Lab Results  Component Value Date   INSULIN 43.2 (H) 04/28/2020   Lab Results  Component Value Date   TSH 4.140 04/28/2020   Lab Results  Component Value Date   CHOL 122 04/28/2020   HDL 39 (L) 04/28/2020   LDLCALC 62 04/28/2020   TRIG 114 04/28/2020   CHOLHDL 3.1 04/28/2020   Lab Results  Component Value Date   WBC 8.3 04/28/2020   HGB 12.6 (L) 04/28/2020   HCT 39.6 04/28/2020   MCV 86 04/28/2020   PLT 277 04/28/2020    Attestation Statements:   Reviewed by clinician on day of visit: allergies, medications, problem list, medical history, surgical history, family history, social history, and previous encounter notes.  04/30/2020, am acting as Edmund Hilda for Energy manager, DO.  I have reviewed the above documentation for accuracy and completeness, and I agree with the above. [employer], D.O.  The 21st Century Cures Act was signed into law in 2016 which includes  the topic of electronic health records.  This provides immediate access to information in MyChart.  This includes consultation notes, operative notes, office notes, lab results and pathology reports.  If you have any questions about what you read please let 2017 know at your next visit so we can discuss your concerns and take corrective action if need be.  We are right here with you.

## 2020-07-16 ENCOUNTER — Ambulatory Visit (INDEPENDENT_AMBULATORY_CARE_PROVIDER_SITE_OTHER): Payer: No Typology Code available for payment source | Admitting: Family Medicine

## 2020-08-11 DEATH — deceased

## 2020-08-27 ENCOUNTER — Telehealth: Payer: Self-pay | Admitting: *Deleted

## 2020-08-27 NOTE — Telephone Encounter (Signed)
Mr. Helmstetter is deceased,recall removed.
